# Patient Record
Sex: Female | Born: 1935 | Race: White | Hispanic: No | Smoking: Never smoker
Health system: Southern US, Community
[De-identification: ages and names within clinical notes are randomized; demographics above are authoritative.]

## PROBLEM LIST (undated history)

## (undated) DIAGNOSIS — N179 Acute kidney failure, unspecified: Secondary | ICD-10-CM

## (undated) DIAGNOSIS — R7301 Impaired fasting glucose: Secondary | ICD-10-CM

## (undated) DIAGNOSIS — K219 Gastro-esophageal reflux disease without esophagitis: Secondary | ICD-10-CM

## (undated) DIAGNOSIS — I10 Essential (primary) hypertension: Secondary | ICD-10-CM

## (undated) DIAGNOSIS — S72002A Fracture of unspecified part of neck of left femur, initial encounter for closed fracture: Secondary | ICD-10-CM

## (undated) DIAGNOSIS — I5189 Other ill-defined heart diseases: Secondary | ICD-10-CM

## (undated) DIAGNOSIS — I4819 Other persistent atrial fibrillation: Secondary | ICD-10-CM

## (undated) HISTORY — DX: Other persistent atrial fibrillation: I48.19

## (undated) HISTORY — DX: Impaired fasting glucose: R73.01

## (undated) HISTORY — PX: ABDOMINAL HYSTERECTOMY: SHX81

## (undated) HISTORY — DX: Fracture of unspecified part of neck of left femur, initial encounter for closed fracture: S72.002A

## (undated) HISTORY — DX: Other ill-defined heart diseases: I51.89

## (undated) HISTORY — DX: Acute kidney failure, unspecified: N17.9

## (undated) HISTORY — PX: CHOLECYSTECTOMY: SHX55

---

## 2003-12-02 ENCOUNTER — Ambulatory Visit: Payer: Self-pay | Admitting: Ophthalmology

## 2003-12-02 ENCOUNTER — Other Ambulatory Visit: Payer: Self-pay

## 2003-12-09 ENCOUNTER — Ambulatory Visit: Payer: Self-pay | Admitting: Ophthalmology

## 2004-03-05 ENCOUNTER — Ambulatory Visit: Payer: Self-pay | Admitting: Unknown Physician Specialty

## 2004-07-20 ENCOUNTER — Ambulatory Visit: Payer: Self-pay | Admitting: Unknown Physician Specialty

## 2005-01-06 ENCOUNTER — Emergency Department: Payer: Self-pay | Admitting: Emergency Medicine

## 2005-01-06 ENCOUNTER — Other Ambulatory Visit: Payer: Self-pay

## 2005-01-07 ENCOUNTER — Ambulatory Visit: Payer: Self-pay | Admitting: Internal Medicine

## 2005-02-26 ENCOUNTER — Ambulatory Visit: Payer: Self-pay | Admitting: Unknown Physician Specialty

## 2005-03-10 ENCOUNTER — Ambulatory Visit: Payer: Self-pay | Admitting: Unknown Physician Specialty

## 2005-05-11 ENCOUNTER — Ambulatory Visit: Payer: Self-pay | Admitting: General Surgery

## 2005-06-20 ENCOUNTER — Emergency Department: Payer: Self-pay | Admitting: Emergency Medicine

## 2005-06-22 ENCOUNTER — Ambulatory Visit: Payer: Self-pay | Admitting: Emergency Medicine

## 2005-06-24 ENCOUNTER — Ambulatory Visit: Payer: Self-pay | Admitting: Internal Medicine

## 2005-06-30 ENCOUNTER — Ambulatory Visit: Payer: Self-pay | Admitting: Emergency Medicine

## 2006-03-15 ENCOUNTER — Ambulatory Visit: Payer: Self-pay | Admitting: Unknown Physician Specialty

## 2006-08-04 ENCOUNTER — Ambulatory Visit: Payer: Self-pay | Admitting: Unknown Physician Specialty

## 2006-09-06 ENCOUNTER — Ambulatory Visit: Payer: Self-pay | Admitting: Unknown Physician Specialty

## 2007-06-22 ENCOUNTER — Emergency Department: Payer: Self-pay | Admitting: Emergency Medicine

## 2010-07-19 ENCOUNTER — Emergency Department: Payer: Self-pay | Admitting: Emergency Medicine

## 2015-07-29 ENCOUNTER — Encounter: Payer: Self-pay | Admitting: Emergency Medicine

## 2015-07-29 ENCOUNTER — Emergency Department
Admission: EM | Admit: 2015-07-29 | Discharge: 2015-07-29 | Disposition: A | Discharge status: Home / Self Care | Payer: Medicare Other | Attending: Emergency Medicine | Admitting: Emergency Medicine

## 2015-07-29 ENCOUNTER — Emergency Department: Payer: Medicare Other

## 2015-07-29 DIAGNOSIS — K6289 Other specified diseases of anus and rectum: Secondary | ICD-10-CM

## 2015-07-29 DIAGNOSIS — N200 Calculus of kidney: Secondary | ICD-10-CM

## 2015-07-29 DIAGNOSIS — N390 Urinary tract infection, site not specified: Secondary | ICD-10-CM

## 2015-07-29 DIAGNOSIS — Z90710 Acquired absence of both cervix and uterus: Secondary | ICD-10-CM | POA: Insufficient documentation

## 2015-07-29 DIAGNOSIS — I10 Essential (primary) hypertension: Secondary | ICD-10-CM | POA: Insufficient documentation

## 2015-07-29 DIAGNOSIS — R1084 Generalized abdominal pain: Secondary | ICD-10-CM

## 2015-07-29 HISTORY — DX: Essential (primary) hypertension: I10

## 2015-07-29 LAB — CBC WITH DIFFERENTIAL/PLATELET
BASOS ABS: 0.1 10*3/uL (ref 0–0.1)
Basophils Relative: 1 %
Eosinophils Absolute: 0.2 10*3/uL (ref 0–0.7)
Eosinophils Relative: 3 %
HEMATOCRIT: 42.8 % (ref 35.0–47.0)
Hemoglobin: 14.7 g/dL (ref 12.0–16.0)
LYMPHS ABS: 2.7 10*3/uL (ref 1.0–3.6)
LYMPHS PCT: 27 %
MCH: 33.2 pg (ref 26.0–34.0)
MCHC: 34.3 g/dL (ref 32.0–36.0)
MCV: 96.7 fL (ref 80.0–100.0)
MONO ABS: 0.7 10*3/uL (ref 0.2–0.9)
Monocytes Relative: 7 %
NEUTROS ABS: 6.2 10*3/uL (ref 1.4–6.5)
Neutrophils Relative %: 62 %
Platelets: 210 10*3/uL (ref 150–440)
RBC: 4.42 MIL/uL (ref 3.80–5.20)
RDW: 13.5 % (ref 11.5–14.5)
WBC: 10 10*3/uL (ref 3.6–11.0)

## 2015-07-29 LAB — URINALYSIS COMPLETE WITH MICROSCOPIC (ARMC ONLY)
BILIRUBIN URINE: NEGATIVE
Bacteria, UA: NONE SEEN
GLUCOSE, UA: NEGATIVE mg/dL
Ketones, ur: NEGATIVE mg/dL
Nitrite: NEGATIVE
PH: 7 (ref 5.0–8.0)
Protein, ur: NEGATIVE mg/dL
Specific Gravity, Urine: 1.01 (ref 1.005–1.030)

## 2015-07-29 LAB — COMPREHENSIVE METABOLIC PANEL
ALT: 18 U/L (ref 14–54)
AST: 22 U/L (ref 15–41)
Albumin: 4.4 g/dL (ref 3.5–5.0)
Alkaline Phosphatase: 86 U/L (ref 38–126)
Anion gap: 7 (ref 5–15)
BILIRUBIN TOTAL: 0.9 mg/dL (ref 0.3–1.2)
BUN: 22 mg/dL — AB (ref 6–20)
CALCIUM: 9.4 mg/dL (ref 8.9–10.3)
CO2: 26 mmol/L (ref 22–32)
CREATININE: 0.98 mg/dL (ref 0.44–1.00)
Chloride: 108 mmol/L (ref 101–111)
GFR calc Af Amer: 57 mL/min — ABNORMAL LOW (ref >60)
GFR, EST NON AFRICAN AMERICAN: 49 mL/min — AB (ref >60)
Glucose, Bld: 132 mg/dL — ABNORMAL HIGH (ref 65–99)
Potassium: 3.5 mmol/L (ref 3.5–5.1)
Sodium: 141 mmol/L (ref 135–145)
TOTAL PROTEIN: 6.9 g/dL (ref 6.5–8.1)

## 2015-07-29 LAB — LIPASE, BLOOD: LIPASE: 40 U/L (ref 11–51)

## 2015-07-29 MED ORDER — CEPHALEXIN 500 MG PO CAPS: 500.0000 mg | ORAL_CAPSULE | Freq: Two times a day (BID) | ORAL | 0 refills | Status: DC

## 2015-07-29 MED ORDER — IOPAMIDOL (ISOVUE-300) INJECTION 61%
75.0000 mL | Freq: Once | INTRAVENOUS | Status: AC | PRN
  Administered 2015-07-29: 75 mL via INTRAVENOUS

## 2015-07-29 MED ORDER — SODIUM CHLORIDE 0.9 % IV BOLUS (SEPSIS)
1000.0000 mL | Freq: Once | INTRAVENOUS | Status: AC
  Administered 2015-07-29: 1000 mL via INTRAVENOUS

## 2015-07-29 MED ORDER — DIATRIZOATE MEGLUMINE & SODIUM 66-10 % PO SOLN
15.0000 mL | ORAL | Status: AC
  Administered 2015-07-29: 15 mL via ORAL

## 2015-07-29 NOTE — ED Provider Notes (Signed)
[facility] Emergency Department Provider Note  ____________________________________________  Time seen: Approximately 3:20 AM  I have reviewed the triage vital signs and the nursing notes.   HISTORY  Chief Complaint Abdominal Pain and Rectal Pain    HPI Vanessa Brock is a 82 y.o. female brought to the ED due to rectal pain it's been going on for 2 weeks. She was seen in primary care clinic where they're treating her for anal pruritus presumptively due to hemorrhoids. No vomiting or diarrhea, normal oral intake. Patient denies any abdominal pain. No chest pain shortness breath fever chills or sweats. No dizziness or syncope. No black stool or bloody stool. No dysuria frequency urgency.     Past Medical History:  Diagnosis Date  . Hypertension      There are no active problems to display for this patient.    Past Surgical History:  Procedure Laterality Date  . ABDOMINAL HYSTERECTOMY    Cholecystectomy      Allergies Aspirin and Sulfur   History reviewed. No pertinent family history.  Social History Social History  Substance Use Topics  . Smoking status: Never Smoker  . Smokeless tobacco: Never Used  . Alcohol use No    Review of Systems  Constitutional:   No fever or chills.  ENT:   No sore throat. No rhinorrhea. Cardiovascular:   No chest pain. Respiratory:   No dyspnea or cough. Gastrointestinal:   Negative for abdominal pain, vomiting and diarrhea. Rectal pain Genitourinary:   Negative for dysuria or difficulty urinating. Musculoskeletal:   Negative for focal pain or swelling Neurological:   Negative for headaches 10-point ROS otherwise negative.  ____________________________________________   PHYSICAL EXAM:  VITAL SIGNS: ED Triage Vitals  Enc Vitals Group     BP 07/29/15 0308 (!) 179/80     Pulse Rate 07/29/15 0308 92     Resp 07/29/15 0308 16     Temp 07/29/15 0308 98 F (36.7 C)     Temp Source 07/29/15 0308 Oral      SpO2 07/29/15 0308 95 %     Weight 07/29/15 0308 90 lb (40.8 kg)     Height 07/29/15 0308 5\' 3"  (1.6 m)     Head Circumference --      Peak Flow --      Pain Score 07/29/15 0311 5     Pain Loc --      Pain Edu? --      Excl. in GC? --     Vital signs reviewed, nursing assessments reviewed.   Constitutional:   Alert and oriented. Well appearing and in no distress. Eyes:   No scleral icterus. No conjunctival pallor. PERRL. EOMI.  No nystagmus. ENT   Head:   Normocephalic and atraumatic.   Nose:   No congestion/rhinnorhea. No septal hematoma   Mouth/Throat:   Dry mucous membranes, no pharyngeal erythema. No peritonsillar mass.    Neck:   No stridor. No SubQ emphysema. No meningismus. Hematological/Lymphatic/Immunilogical:   No cervical lymphadenopathy. Cardiovascular:   RRR. Symmetric bilateral radial and DP pulses.  No murmurs.  Respiratory:   Normal respiratory effort without tachypnea nor retractions. Breath sounds are clear and equal bilaterally. No wheezes/rales/rhonchi. Gastrointestinal:   Soft with generalized tenderness. Non distended. There is no CVA tenderness.  No rebound, rigidity, or guarding. Genitourinary:   deferred Musculoskeletal:   Nontender with normal range of motion in all extremities. No joint effusions.  No lower extremity tenderness.  No edema. Neurologic:  Normal speech and language.  CN 2-10 normal. Motor grossly intact. No gross focal neurologic deficits are appreciated.  Skin:    Skin is warm, dry and intact. No rash noted.  No petechiae, purpura, or bullae.  ____________________________________________    LABS (pertinent positives/negatives) (all labs ordered are listed, but only abnormal results are displayed) Labs Reviewed  COMPREHENSIVE METABOLIC PANEL - Abnormal; Notable for the following:       Result Value   Glucose, Bld 132 (*)    BUN 22 (*)    GFR calc non Af Amer 49 (*)    GFR calc Af Amer 57 (*)    All other  components within normal limits  URINALYSIS COMPLETEWITH MICROSCOPIC ([facility] ONLY) - Abnormal; Notable for the following:    Color, Urine YELLOW (*)    APPearance CLEAR (*)    Hgb urine dipstick 1+ (*)    Leukocytes, UA 2+ (*)    Squamous Epithelial / LPF 0-5 (*)    All other components within normal limits  URINE CULTURE  LIPASE, BLOOD  CBC WITH DIFFERENTIAL/PLATELET   ____________________________________________   EKG  Interpreted by me Sinus rhythm rate of 84, normal axis, normal intervals. Normal QRS ST segments and T waves.  ____________________________________________    RADIOLOGY  CT abdomen pelvis reveals diverticulosis without diverticulitis. 7 millimeter nephrolithiasis without obstruction. Largely pain vasculature, no acute findings.  ____________________________________________   PROCEDURES Procedures  ____________________________________________   INITIAL IMPRESSION / ASSESSMENT AND PLAN / ED COURSE  Pertinent labs & imaging results that were available during my care of the patient were reviewed by me and considered in my medical decision making (see chart for details).  Patient well appearing no acute distress. Has abdominal tenderness. No recent abdominal and or imaging. We'll get labs urinalysis and CT scan of the abdomen. IV fluids for now for hydration.    ----------------------------------------- 6:56 AM on 07/29/2015 -----------------------------------------  Urinalysis suggestive of UTI. CT essentially negative. 7 mm nephrolithiasis without obstruction. We'll treat with Keflex, follow-up with primary care. Urine culture sent. Patient is very well-appearing, suitable for outpatient follow-up.   Clinical Course   ____________________________________________   FINAL CLINICAL IMPRESSION(S) / ED DIAGNOSES  Final diagnoses:  Rectal pain  Generalized abdominal pain  Nephrolithiasis  UTI (lower urinary tract infection)       Portions of  this note were generated with dragon dictation software. Dictation errors may occur despite best attempts at proofreading.    Sharman Cheek, MD 07/29/15 [phone]

## 2015-07-29 NOTE — ED Notes (Signed)

## 2015-07-29 NOTE — ED Triage Notes (Signed)
Pt arrived to the ED accompanied by family for complaints of rectal pain/abdominal pain. Pt denies any GI symptoms but admits to vomiting. Pt is AOx4 in moderate pain distress.

## 2015-07-29 NOTE — ED Notes (Signed)
Pt assisted up to commode in room, pt ambulated with steady gait.

## 2015-07-31 LAB — URINE CULTURE

## 2015-08-01 NOTE — Progress Notes (Signed)
MEDICATION RELATED CONSULT NOTE - INITIAL   Pharmacy Consult for ED Culture Results   Allergies  Allergen Reactions  . Aspirin Nausea And Vomiting  . Sulfur Hives   Patient Measurements: Height: 5\' 3"  (160 cm) Weight: 90 lb (40.8 kg) IBW/kg (Calculated) : 52.4  Microbiology: Recent Results (from the past 720 hour(s))  Urine culture     Status: Abnormal   Collection Time: 07/29/15  4:17 AM  Result Value Ref Range Status   Specimen Description URINE, RANDOM  Final   Special Requests NONE  Final   Culture 60,000 COLONIES/mL ENTEROCOCCUS SPECIES (A)  Final   Report Status 07/31/2015 FINAL  Final   Organism ID, Bacteria ENTEROCOCCUS SPECIES (A)  Final      Susceptibility   Enterococcus species - MIC*    AMPICILLIN <=2 SENSITIVE Sensitive     LEVOFLOXACIN 1 SENSITIVE Sensitive     NITROFURANTOIN <=16 SENSITIVE Sensitive     VANCOMYCIN 1 SENSITIVE Sensitive     * 60,000 COLONIES/mL ENTEROCOCCUS SPECIES    Medical History: Past Medical History:  Diagnosis Date  . Hypertension     Assessment: 82 yo female seen in ED for Rectal pain, abdominal pain and nephrolithiasis. Patient's UA questionable for UTI, and patient was discharged on Cephalexin 500mg  BID for 7 days.  Urine cultures showing only 60,000 colonies/mL of ENTEROCOCCUS SPECIES.   Patient had no dysuria, frequency, or urgency.   Plan:  Patient was instructed to F/U with outpatient MD.  Patient has appt scheduled for 8/1 with PCP Maurine Minister, PA at [facility].  Patient's culture results were faxed to office and message left for provider  informing them about patients  ED visit and culture results.   Fax transmission confirmed at 1442 on 7/28.   Cher Nakai, PharmD Clinical Pharmacist 08/01/2015 3:18 PM

## 2016-09-14 ENCOUNTER — Emergency Department
Admission: EM | Admit: 2016-09-14 | Discharge: 2016-09-14 | Disposition: A | Discharge status: Home / Self Care | Payer: Medicare Other | Attending: Emergency Medicine | Admitting: Emergency Medicine

## 2016-09-14 DIAGNOSIS — I10 Essential (primary) hypertension: Secondary | ICD-10-CM | POA: Diagnosis not present

## 2016-09-14 DIAGNOSIS — R102 Pelvic and perineal pain: Secondary | ICD-10-CM | POA: Diagnosis present

## 2016-09-14 DIAGNOSIS — N952 Postmenopausal atrophic vaginitis: Secondary | ICD-10-CM | POA: Diagnosis not present

## 2016-09-14 HISTORY — DX: Gastro-esophageal reflux disease without esophagitis: K21.9

## 2016-09-14 LAB — URINALYSIS, COMPLETE (UACMP) WITH MICROSCOPIC
BACTERIA UA: NONE SEEN
Bilirubin Urine: NEGATIVE
Glucose, UA: NEGATIVE mg/dL
Hgb urine dipstick: NEGATIVE
KETONES UR: NEGATIVE mg/dL
Nitrite: NEGATIVE
PROTEIN: NEGATIVE mg/dL
Specific Gravity, Urine: 1.008 (ref 1.005–1.030)
pH: 7 (ref 5.0–8.0)

## 2016-09-14 MED ORDER — ESTROGENS, CONJUGATED 0.625 MG/GM VA CREA: 0.5000 g | TOPICAL_CREAM | Freq: Every day | VAGINAL | 0 refills | Status: AC

## 2016-09-14 NOTE — ED Notes (Signed)

## 2016-09-14 NOTE — ED Notes (Signed)
Pt assist to toilet in room for urine specimen. No complications

## 2016-09-14 NOTE — Discharge Instructions (Signed)
PLease follow up with your primary care physician °

## 2016-09-14 NOTE — ED Notes (Signed)
ED Provider at bedside. 

## 2016-09-14 NOTE — ED Provider Notes (Signed)
[facility] Emergency Department Provider Note   ____________________________________________   First MD Initiated Contact with Patient 09/14/16 [phone]     (approximate)  I have reviewed the triage vital signs and the nursing notes.   HISTORY  Chief Complaint Cyst    HPI Vanessa Brock is a 83 y.o. female Who comes into the hospital today with some vaginal pain. The patient states that she woke up hurting inside her vagina. She states that she's been having this problem for 2 weeks. She states that she wakes up at night and she has this pain. The patient also reports that she's had some pain with urination. She hasn't seen her doctor for this episode. She's been taking Tylenol to go to sleep but then woke up tonight with the pain. The patient reports that the pain seems to go down into her rectum and down her leg. The patient has had this pain before and her doctor gave her a cream to put on it. They couldn't afford the estrogen cream so she just uses K-Y jelly occasionally. The patient rates her pain a 6 out of 10 in intensity. She said it just aches on the side. She also has a cyst on her inner thigh that she's not sure if that may be causing her symptoms. The patient is here today for evaluation.   Past Medical History:  Diagnosis Date  . Hypertension     There are no active problems to display for this patient.   Past Surgical History:  Procedure Laterality Date  . ABDOMINAL HYSTERECTOMY      Prior to Admission medications   Medication Sig Start Date End Date Taking? Authorizing Provider  cephALEXin (KEFLEX) 500 MG capsule Take 1 capsule (500 mg total) by mouth 2 (two) times daily. 07/29/15   Sharman CheekStafford, Phillip, MD  conjugated estrogens (PREMARIN) vaginal cream Place 0.25 Applicatorfuls vaginally daily. 09/14/16 09/14/17  Rebecka ApleyWebster, Deerica Waszak P, MD    Allergies Aspirin and Sulfur  No family history on file.  Social History Social History  Substance Use  Topics  . Smoking status: Never Smoker  . Smokeless tobacco: Never Used  . Alcohol use No    Review of Systems  Constitutional: No fever/chills Eyes: No visual changes. ENT: No sore throat. Cardiovascular: Denies chest pain. Respiratory: Denies shortness of breath. Gastrointestinal: No abdominal pain.  No nausea, no vomiting.  No diarrhea.  No constipation. Genitourinary: vaginal pain Musculoskeletal: Negative for back pain. Skin: Negative for rash. Neurological: Negative for headaches, focal weakness or numbness.   ____________________________________________   PHYSICAL EXAM:  VITAL SIGNS: ED Triage Vitals  Enc Vitals Group     BP 09/14/16 0317 (!) 179/77     Pulse Rate 09/14/16 0317 85     Resp 09/14/16 0318 17     Temp 09/14/16 0318 97.7 F (36.5 C)     Temp Source 09/14/16 0318 Oral     SpO2 09/14/16 0317 97 %     Weight 09/14/16 0310 90 lb (40.8 kg)     Height 09/14/16 0310 5\' 3"  (1.6 m)     Head Circumference --      Peak Flow --      Pain Score 09/14/16 0309 7     Pain Loc --      Pain Edu? --      Excl. in GC? --     Constitutional: Alert and oriented. Well appearing and in mild distress. Eyes: Conjunctivae are normal. PERRL. EOMI. Head: Atraumatic.  Nose: No congestion/rhinnorhea. Mouth/Throat: Mucous membranes are moist.  Oropharynx non-erythematous. Cardiovascular: Normal rate, regular rhythm. Grossly normal heart sounds.  Good peripheral circulation. Respiratory: Normal respiratory effort.  No retractions. Lungs CTAB. Gastrointestinal: Soft and nontender. No distention.  Genitourinary: normal external genitalia with no discharge and no bleeding. Musculoskeletal: No lower extremity tenderness nor edema.  Neurologic:  Normal speech and language. Skin:  Skin is warm, dry and intact. lesion noted to left medial groin that soft but not fluid filled. no redness or tenderness to palpation Psychiatric: Mood and affect are normal.    ____________________________________________   LABS (all labs ordered are listed, but only abnormal results are displayed)  Labs Reviewed  URINALYSIS, COMPLETE (UACMP) WITH MICROSCOPIC - Abnormal; Notable for the following:       Result Value   Color, Urine YELLOW (*)    APPearance CLEAR (*)    Leukocytes, UA TRACE (*)    Squamous Epithelial / LPF 0-5 (*)    All other components within normal limits   ____________________________________________  EKG  none ____________________________________________  RADIOLOGY  No results found.  ____________________________________________   PROCEDURES  Procedure(s) performed: None  Procedures  Critical Care performed: No  ____________________________________________   INITIAL IMPRESSION / ASSESSMENT AND PLAN / ED COURSE  Pertinent labs & imaging results that were available during my care of the patient were reviewed by me and considered in my medical decision making (see chart for details).  this is a 83 year old female who comes into the hospital today with some vaginal pain. I looked at the patient's vagina and she has no lesions, bleeding or drainage. I also placed a finger into the introitus and the canal and did not feel any lesions. I feel that the patient may have some atrophic vaginitis causing some of her discomfort. The patient's daughter did confirm that she's had this before and was told that it was vaginitis. They report that they could not afford the cream which was prescribed by her doctor. The patient was concerned that the lesion on her leg might be causing the symptoms. It is not fluid filled but soft and nontender. I informed her that I do not feel that that is causing her vaginal pain but if she wants it removed she can all up with surgery. The patient's daughter again states that it's been evaluated by her primary care physician. I will discharge the patient to home to have her follow back up with her primary  care physician. I checked the patient's urine and it does not appear that she has any infection. She will be discharged home.      ____________________________________________   FINAL CLINICAL IMPRESSION(S) / ED DIAGNOSES  Final diagnoses:  Atrophic vaginitis  Vaginal pain      NEW MEDICATIONS STARTED DURING THIS VISIT:  Discharge Medication List as of 09/14/2016  4:56 AM    START taking these medications   Details  conjugated estrogens (PREMARIN) vaginal cream Place 0.25 Applicatorfuls vaginally daily., Starting Tue 09/14/2016, Until Wed 09/14/2017, Print         Note:  This document was prepared using Dragon voice recognition software and may include unintentional dictation errors.    Rebecka Apley, MD 09/14/16 [phone]

## 2018-02-05 ENCOUNTER — Emergency Department: Payer: Medicare Other

## 2018-02-05 ENCOUNTER — Encounter: Payer: Self-pay | Admitting: Emergency Medicine

## 2018-02-05 ENCOUNTER — Other Ambulatory Visit: Payer: Self-pay

## 2018-02-05 ENCOUNTER — Inpatient Hospital Stay
Admission: EM | Admit: 2018-02-05 | Discharge: 2018-02-09 | DRG: 481 | Disposition: A | Discharge status: Skilled Nursing Facility | Payer: Medicare Other | Attending: Internal Medicine | Admitting: Internal Medicine

## 2018-02-05 DIAGNOSIS — N183 Chronic kidney disease, stage 3 (moderate): Secondary | ICD-10-CM | POA: Diagnosis present

## 2018-02-05 DIAGNOSIS — Z66 Do not resuscitate: Secondary | ICD-10-CM | POA: Diagnosis present

## 2018-02-05 DIAGNOSIS — I4891 Unspecified atrial fibrillation: Secondary | ICD-10-CM | POA: Diagnosis not present

## 2018-02-05 DIAGNOSIS — I129 Hypertensive chronic kidney disease with stage 1 through stage 4 chronic kidney disease, or unspecified chronic kidney disease: Secondary | ICD-10-CM | POA: Diagnosis present

## 2018-02-05 DIAGNOSIS — Z882 Allergy status to sulfonamides status: Secondary | ICD-10-CM

## 2018-02-05 DIAGNOSIS — K219 Gastro-esophageal reflux disease without esophagitis: Secondary | ICD-10-CM | POA: Diagnosis present

## 2018-02-05 DIAGNOSIS — S72009A Fracture of unspecified part of neck of unspecified femur, initial encounter for closed fracture: Secondary | ICD-10-CM | POA: Diagnosis present

## 2018-02-05 DIAGNOSIS — N179 Acute kidney failure, unspecified: Secondary | ICD-10-CM | POA: Diagnosis present

## 2018-02-05 DIAGNOSIS — Z886 Allergy status to analgesic agent status: Secondary | ICD-10-CM

## 2018-02-05 DIAGNOSIS — M25552 Pain in left hip: Secondary | ICD-10-CM | POA: Diagnosis present

## 2018-02-05 DIAGNOSIS — S72142A Displaced intertrochanteric fracture of left femur, initial encounter for closed fracture: Secondary | ICD-10-CM | POA: Diagnosis present

## 2018-02-05 DIAGNOSIS — S72002A Fracture of unspecified part of neck of left femur, initial encounter for closed fracture: Secondary | ICD-10-CM

## 2018-02-05 DIAGNOSIS — Z79899 Other long term (current) drug therapy: Secondary | ICD-10-CM | POA: Diagnosis not present

## 2018-02-05 DIAGNOSIS — Z419 Encounter for procedure for purposes other than remedying health state, unspecified: Secondary | ICD-10-CM

## 2018-02-05 DIAGNOSIS — D72829 Elevated white blood cell count, unspecified: Secondary | ICD-10-CM | POA: Diagnosis present

## 2018-02-05 DIAGNOSIS — R7301 Impaired fasting glucose: Secondary | ICD-10-CM | POA: Diagnosis present

## 2018-02-05 DIAGNOSIS — Z90710 Acquired absence of both cervix and uterus: Secondary | ICD-10-CM | POA: Diagnosis not present

## 2018-02-05 DIAGNOSIS — W1830XA Fall on same level, unspecified, initial encounter: Secondary | ICD-10-CM | POA: Diagnosis present

## 2018-02-05 DIAGNOSIS — E860 Dehydration: Secondary | ICD-10-CM | POA: Diagnosis present

## 2018-02-05 LAB — CBC WITH DIFFERENTIAL/PLATELET
ABS IMMATURE GRANULOCYTES: 0.07 10*3/uL (ref 0.00–0.07)
BASOS ABS: 0.1 10*3/uL (ref 0.0–0.1)
BASOS PCT: 1 %
Eosinophils Absolute: 0 10*3/uL (ref 0.0–0.5)
Eosinophils Relative: 0 %
HCT: 36.5 % (ref 36.0–46.0)
HEMOGLOBIN: 12.2 g/dL (ref 12.0–15.0)
Immature Granulocytes: 0 %
LYMPHS PCT: 6 %
Lymphs Abs: 1.1 10*3/uL (ref 0.7–4.0)
MCH: 32.6 pg (ref 26.0–34.0)
MCHC: 33.4 g/dL (ref 30.0–36.0)
MCV: 97.6 fL (ref 80.0–100.0)
Monocytes Absolute: 1.1 10*3/uL — ABNORMAL HIGH (ref 0.1–1.0)
Monocytes Relative: 6 %
NEUTROS ABS: 15.3 10*3/uL — AB (ref 1.7–7.7)
NRBC: 0 % (ref 0.0–0.2)
Neutrophils Relative %: 87 %
Platelets: 276 10*3/uL (ref 150–400)
RBC: 3.74 MIL/uL — AB (ref 3.87–5.11)
RDW: 13 % (ref 11.5–15.5)
WBC: 17.7 10*3/uL — AB (ref 4.0–10.5)

## 2018-02-05 LAB — PROTIME-INR
INR: 1.05
PROTHROMBIN TIME: 13.6 s (ref 11.4–15.2)

## 2018-02-05 LAB — BASIC METABOLIC PANEL
ANION GAP: 6 (ref 5–15)
BUN: 27 mg/dL — ABNORMAL HIGH (ref 8–23)
CHLORIDE: 108 mmol/L (ref 98–111)
CO2: 24 mmol/L (ref 22–32)
Calcium: 7.9 mg/dL — ABNORMAL LOW (ref 8.9–10.3)
Creatinine, Ser: 1.37 mg/dL — ABNORMAL HIGH (ref 0.44–1.00)
GFR calc Af Amer: 38 mL/min — ABNORMAL LOW (ref >60)
GFR calc non Af Amer: 33 mL/min — ABNORMAL LOW (ref >60)
Glucose, Bld: 226 mg/dL — ABNORMAL HIGH (ref 70–99)
POTASSIUM: 3.6 mmol/L (ref 3.5–5.1)
SODIUM: 138 mmol/L (ref 135–145)

## 2018-02-05 LAB — SURGICAL PCR SCREEN
MRSA, PCR: NEGATIVE
Staphylococcus aureus: NEGATIVE

## 2018-02-05 MED ORDER — SODIUM CHLORIDE 0.9 % IV SOLN
INTRAVENOUS | Status: DC
  Administered 2018-02-05: via INTRAVENOUS

## 2018-02-05 MED ORDER — LOSARTAN POTASSIUM 50 MG PO TABS
50.0000 mg | ORAL_TABLET | Freq: Every day | ORAL | Status: DC
  Administered 2018-02-06 – 2018-02-09 (×4): 50 mg via ORAL
  Filled 2018-02-05 (×4): qty 1

## 2018-02-05 MED ORDER — DILTIAZEM HCL ER BEADS 240 MG PO CP24
360.0000 mg | ORAL_CAPSULE | Freq: Every day | ORAL | Status: DC
  Filled 2018-02-05: qty 1

## 2018-02-05 MED ORDER — ACETAMINOPHEN 325 MG PO TABS: 650.0000 mg | ORAL_TABLET | Freq: Four times a day (QID) | ORAL | Status: DC | PRN

## 2018-02-05 MED ORDER — ONDANSETRON HCL 4 MG/2ML IJ SOLN: 4.0000 mg | Freq: Four times a day (QID) | INTRAMUSCULAR | Status: DC | PRN

## 2018-02-05 MED ORDER — MORPHINE SULFATE (PF) 2 MG/ML IV SOLN
2.0000 mg | INTRAVENOUS | Status: DC | PRN
  Administered 2018-02-05 – 2018-02-06 (×2): 2 mg via INTRAVENOUS
  Filled 2018-02-05: qty 1

## 2018-02-05 MED ORDER — MORPHINE SULFATE (PF) 2 MG/ML IV SOLN
INTRAVENOUS | Status: AC
  Administered 2018-02-05: 2 mg via INTRAVENOUS
  Filled 2018-02-05: qty 1

## 2018-02-05 MED ORDER — BISACODYL 5 MG PO TBEC: 5.0000 mg | DELAYED_RELEASE_TABLET | Freq: Every day | ORAL | Status: DC | PRN

## 2018-02-05 MED ORDER — MORPHINE SULFATE (PF) 4 MG/ML IV SOLN
4.0000 mg | Freq: Once | INTRAVENOUS | Status: AC
  Administered 2018-02-05: 4 mg via INTRAVENOUS
  Filled 2018-02-05: qty 1

## 2018-02-05 MED ORDER — CEFAZOLIN SODIUM-DEXTROSE 1-4 GM/50ML-% IV SOLN
1.0000 g | Freq: Once | INTRAVENOUS | Status: DC
  Filled 2018-02-05: qty 50

## 2018-02-05 MED ORDER — ONDANSETRON HCL 4 MG PO TABS: 4.0000 mg | ORAL_TABLET | Freq: Four times a day (QID) | ORAL | Status: DC | PRN

## 2018-02-05 MED ORDER — POLYETHYLENE GLYCOL 3350 17 G PO PACK: 17.0000 g | PACK | Freq: Every day | ORAL | Status: DC | PRN

## 2018-02-05 MED ORDER — ACETAMINOPHEN 650 MG RE SUPP: 650.0000 mg | Freq: Four times a day (QID) | RECTAL | Status: DC | PRN

## 2018-02-05 MED ORDER — HYDROCODONE-ACETAMINOPHEN 5-325 MG PO TABS
1.0000 | ORAL_TABLET | ORAL | Status: DC | PRN
  Administered 2018-02-05 – 2018-02-06 (×2): 1 via ORAL
  Filled 2018-02-05 (×2): qty 1

## 2018-02-05 MED ORDER — FLEET ENEMA 7-19 GM/118ML RE ENEM: 1.0000 | ENEMA | Freq: Once | RECTAL | Status: DC | PRN

## 2018-02-05 MED ORDER — ADULT MULTIVITAMIN W/MINERALS CH
1.0000 | ORAL_TABLET | Freq: Every day | ORAL | Status: DC
  Administered 2018-02-07 – 2018-02-09 (×3): 1 via ORAL
  Filled 2018-02-05 (×3): qty 1

## 2018-02-05 MED ORDER — ONDANSETRON HCL 4 MG/2ML IJ SOLN
4.0000 mg | Freq: Once | INTRAMUSCULAR | Status: AC
  Administered 2018-02-05: 4 mg via INTRAVENOUS
  Filled 2018-02-05: qty 2

## 2018-02-05 MED ORDER — PANTOPRAZOLE SODIUM 40 MG PO TBEC: 40.0000 mg | DELAYED_RELEASE_TABLET | Freq: Every day | ORAL | Status: DC

## 2018-02-05 MED ORDER — CALCIUM CARBONATE-VITAMIN D 500-200 MG-UNIT PO TABS
1.0000 | ORAL_TABLET | Freq: Two times a day (BID) | ORAL | Status: DC
  Administered 2018-02-06 – 2018-02-09 (×6): 1 via ORAL
  Filled 2018-02-05 (×8): qty 1

## 2018-02-05 NOTE — Progress Notes (Signed)
Family Meeting Note  Advance Directive:yes  Today a meeting took place with the Patient.family The following clinical team members were present during this meeting:MD  The following were discussed:Patient's diagnosis:left hip fracture , Patient's progosis: Unable to determine and Goals for treatment: DNR  Additional follow-up to be provided: No change advanced directives patient is DNR Outpatient palliative care services would be of benefit at discharge  Time spent during discussion: 16 minutes  Vanessa Moro, MD

## 2018-02-05 NOTE — ED Triage Notes (Signed)
Patient presents to the ED via EMS from home post mechanical fall over a threshold.  Patient denies hitting her head and does not take blood thinners.  Patient is complaining of left hip pain that is severe when she moves.  Per EMS patient was unable to straighten her left leg.

## 2018-02-05 NOTE — H&P (Signed)
[facility] [place] [facility]   PATIENT NAME: Vanessa Brock    MR#:  [id number]  DATE OF BIRTH:  Aug 05, 1922  DATE OF ADMISSION:  02/05/2018  PRIMARY CARE PHYSICIAN: Vanessa ParadiseMcLaughlin, Miriam K, MD   REQUESTING/REFERRING PHYSICIAN: dr Marisa Brock  CHIEF COMPLAINT:   Hip pain HISTORY OF PRESENT ILLNESS:  Vanessa Brock  is a 85 y.o. female with a known history of hypertension who presents after mechanical fall. Patient arrived to the ER via EMS after she fell from a threshold.  She denies hitting her head.  She was complaining of left hip pain.  She is found to have a left hip fracture.  ER physician has spoken with the orthopedic surgeon who plans to take patient to the OR tomorrow. PAST MEDICAL HISTORY:   Past Medical History:  Diagnosis Date  . GERD (gastroesophageal reflux disease)   . Hypertension     PAST SURGICAL HISTORY:   Past Surgical History:  Procedure Laterality Date  . ABDOMINAL HYSTERECTOMY    . CHOLECYSTECTOMY      SOCIAL HISTORY:   Social History   Tobacco Use  . Smoking status: Never Smoker  . Smokeless tobacco: Never Used  Substance Use Topics  . Alcohol use: No    FAMILY HISTORY:  No family history on file.  DRUG ALLERGIES:   Allergies  Allergen Reactions  . Aspirin Nausea And Vomiting  . Sulfur Hives    REVIEW OF SYSTEMS:   Review of Systems  Constitutional: Negative.  Negative for chills, fever and malaise/fatigue.  HENT: Negative.  Negative for ear discharge, ear pain, hearing loss, nosebleeds and sore throat.   Eyes: Negative.  Negative for blurred vision and pain.  Respiratory: Negative.  Negative for cough, hemoptysis, shortness of breath and wheezing.   Cardiovascular: Negative.  Negative for chest pain, palpitations and leg swelling.  Gastrointestinal: Negative.  Negative for abdominal pain, blood in stool, diarrhea, nausea and vomiting.  Genitourinary: Negative.  Negative for dysuria.  Musculoskeletal: Positive for falls and  joint pain. Negative for back pain.  Skin: Negative.   Neurological: Negative for dizziness, tremors, speech change, focal weakness, seizures and headaches.  Endo/Heme/Allergies: Negative.  Does not bruise/bleed easily.  Psychiatric/Behavioral: Negative.  Negative for depression, hallucinations and suicidal ideas.    MEDICATIONS AT HOME:   Prior to Admission medications   Medication Sig Start Date End Date Taking? Authorizing Provider  Calcium Carb-Cholecalciferol (CALCIUM-VITAMIN D) 500-200 MG-UNIT tablet Take 1 tablet by mouth 2 (two) times daily.   Yes [provider]  diltiazem (TIAZAC) 360 MG 24 hr capsule Take 360 mg by mouth daily. 01/15/18  Yes [provider]  losartan (COZAAR) 25 MG tablet Take 50 mg by mouth daily.  02/02/18  Yes [provider]  Multiple Vitamin (MULTIVITAMIN) capsule Take 1 capsule by mouth daily.   Yes [provider]  omeprazole (PRILOSEC) 20 MG capsule Take 20 mg by mouth daily. 01/15/18  Yes [provider]  cephALEXin (KEFLEX) 500 MG capsule Take 1 capsule (500 mg total) by mouth 2 (two) times daily. 07/29/15   Sharman CheekStafford, Phillip, MD      VITAL SIGNS:  Blood pressure 136/60, pulse 82, resp. rate 12, SpO2 95 %.  PHYSICAL EXAMINATION:   Physical Exam Constitutional:      General: She is not in acute distress. HENT:     Head: Normocephalic.  Eyes:     General: No scleral icterus. Neck:     Musculoskeletal: Normal range of motion and  neck supple.     Vascular: No JVD.     Trachea: No tracheal deviation.  Cardiovascular:     Rate and Rhythm: Normal rate and regular rhythm.     Heart sounds: Normal heart sounds. No murmur. No friction rub. No gallop.   Pulmonary:     Effort: Pulmonary effort is normal. No respiratory distress.     Breath sounds: Normal breath sounds. No wheezing or rales.  Chest:     Chest wall: No tenderness.  Abdominal:     General: Bowel sounds are normal. There is no distension.      Palpations: Abdomen is soft. There is no mass.     Tenderness: There is no abdominal tenderness. There is no guarding or rebound.  Musculoskeletal:     Comments: Left leg is shorter  Skin:    General: Skin is warm.     Findings: No erythema or rash.  Neurological:     Mental Status: She is alert and oriented to person, place, and time.  Psychiatric:        Judgment: Judgment normal.       LABORATORY PANEL:   CBC Recent Labs  Lab 02/05/18 1619  WBC 17.7*  HGB 12.2  HCT 36.5  PLT 276   ------------------------------------------------------------------------------------------------------------------  Chemistries  Recent Labs  Lab 02/05/18 1619  NA 138  Brock 3.6  CL 108  CO2 24  GLUCOSE 226*  BUN 27*  CREATININE 1.37*  CALCIUM 7.9*   ------------------------------------------------------------------------------------------------------------------  Cardiac Enzymes No results for input(s): TROPONINI in the last 168 hours. ------------------------------------------------------------------------------------------------------------------  RADIOLOGY:  Dg Hip Unilat With Pelvis 2-3 Views Left  Result Date: 02/05/2018 CLINICAL DATA:  Fall. EXAM: DG HIP (WITH OR WITHOUT PELVIS) 2-3V LEFT COMPARISON:  None. FINDINGS: Frontal pelvis shows diffuse bony demineralization. SI joints and symphysis pubis unremarkable. No evidence for pubic ramus fracture. AP and cross-table lateral views of the left hip show an intertrochanteric left femoral neck fracture with varus angulation. IMPRESSION: Intertrochanteric left femoral neck fracture with varus angulation. Electronically Signed   By: Kennith Center M.D.   On: 02/05/2018 15:52    EKG:  Normal sinus rhythm PVCs no ST elevation or depression  IMPRESSION AND PLAN:   85 year old female with history of hypertension who lives at home by herself presents to the ER via EMS after mechanical fall and has suffered left hip fracture.  1.   Intertrochanteric left femoral neck fracture with valgus angulation: Patient is at moderate risk for moderate risk procedure may proceed to surgery without further cardiac work-up. DVT prophylaxis per orthopedic surgery. Continue pain control Continue calcium supplement 2.  Essential hypertension: Continue Cozaar and diltiazem  3.  GERD: Continue PPI  4.  Elevated white blood cell count in the setting of acute fracture without signs of any acute infection  5.  Acute kidney injury in the setting of mild dehydration: IV fluids and repeat BMP in a.m.  All the records are reviewed and case discussed with ED provider. Management plans discussed with the patient and family and they are in agreement  CODE STATUS: DNR  TOTAL TIME TAKING CARE OF THIS PATIENT: 44 minutes.    Kaylyn Garrow M.D on 02/05/2018 at 6:10 PM  Between 7am to 6pm - Pager - [phone]  After 6pm go to [email or web address] - Social research officer, government  [facility]  Office  [phone]  CC: Primary care physician; Vanessa Paradise, MD

## 2018-02-05 NOTE — Consult Note (Signed)
ORTHOPAEDIC CONSULTATION  REQUESTING PHYSICIAN: Adrian Saran, MD  Chief Complaint:   L hip pain  History of Present Illness: Vanessa Brock is a 85 y.o. female who had a fall earlier today.  The patient noted immediate hip pain and inability to ambulate.  The patient ambulates unassisted at baseline and lives by herself. Given her age, she is relatively active and gardens/does yard work daily.  Pain is described as sharp at its worst and a dull ache at its best.  Pain is rated a 10 out of 10 in severity.  Pain is improved with rest and immobilization.  Pain is worse with any sort of movement.  X-rays in the emergency department show a left intertrochanteric hip fracture.  She last ate between 1-2pm today.   Past Medical History:  Diagnosis Date  . GERD (gastroesophageal reflux disease)   . Hypertension    Past Surgical History:  Procedure Laterality Date  . ABDOMINAL HYSTERECTOMY    . CHOLECYSTECTOMY     Social History   Socioeconomic History  . Marital status: Divorced    Spouse name: Not on file  . Number of children: Not on file  . Years of education: Not on file  . Highest education level: Not on file  Occupational History  . Not on file  Social Needs  . Financial resource strain: Not on file  . Food insecurity:    Worry: Not on file    Inability: Not on file  . Transportation needs:    Medical: Not on file    Non-medical: Not on file  Tobacco Use  . Smoking status: Never Smoker  . Smokeless tobacco: Never Used  Substance and Sexual Activity  . Alcohol use: No  . Drug use: No  . Sexual activity: Never  Lifestyle  . Physical activity:    Days per week: Not on file    Minutes per session: Not on file  . Stress: Not on file  Relationships  . Social connections:    Talks on phone: Not on file    Gets together: Not on file    Attends religious service: Not on file    Active member of club or  organization: Not on file    Attends meetings of clubs or organizations: Not on file    Relationship status: Not on file  Other Topics Concern  . Not on file  Social History Narrative  . Not on file   No family history on file. Allergies  Allergen Reactions  . Aspirin Nausea And Vomiting  . Sulfur Hives   Prior to Admission medications   Medication Sig Start Date End Date Taking? Authorizing Provider  Calcium Carb-Cholecalciferol (CALCIUM-VITAMIN D) 500-200 MG-UNIT tablet Take 1 tablet by mouth 2 (two) times daily.   Yes [provider]  diltiazem (TIAZAC) 360 MG 24 hr capsule Take 360 mg by mouth daily. 01/15/18  Yes [provider]  losartan (COZAAR) 25 MG tablet Take 50 mg by mouth daily.  02/02/18  Yes [provider]  Multiple Vitamin (MULTIVITAMIN) capsule Take 1 capsule by mouth daily.   Yes [provider]  omeprazole (PRILOSEC) 20 MG capsule Take 20 mg by mouth daily. 01/15/18  Yes [provider]  cephALEXin (KEFLEX) 500 MG capsule Take 1 capsule (500 mg total) by mouth 2 (two) times daily. 07/29/15   Sharman Cheek, MD   Recent Labs    02/05/18 1619  WBC 17.7*  HGB 12.2  HCT 36.5  PLT 276  K 3.6  CL 108  CO2 24  BUN 27*  CREATININE 1.37*  GLUCOSE 226*  CALCIUM 7.9*  INR 1.05   Dg Hip Unilat With Pelvis 2-3 Views Left  Result Date: 02/05/2018 CLINICAL DATA:  Fall. EXAM: DG HIP (WITH OR WITHOUT PELVIS) 2-3V LEFT COMPARISON:  None. FINDINGS: Frontal pelvis shows diffuse bony demineralization. SI joints and symphysis pubis unremarkable. No evidence for pubic ramus fracture. AP and cross-table lateral views of the left hip show an intertrochanteric left femoral neck fracture with varus angulation. IMPRESSION: Intertrochanteric left femoral neck fracture with varus angulation. Electronically Signed   By: Kennith CenterEric  Mansell M.D.   On: 02/05/2018 15:52     Positive ROS: All other systems have been reviewed and were otherwise  negative with the exception of those mentioned in the HPI and as above.  Physical Exam: BP (!) 145/64   Pulse 84   Resp (!) 22   SpO2 95%  General:  Alert, no acute distress Psychiatric:  Patient is competent for consent with normal mood and affect   Cardiovascular:  No pedal edema, regular rate and rhythm Respiratory:  No wheezing, non-labored breathing GI:  Abdomen is soft and non-tender Skin:  No lesions in the area of chief complaint, no erythema Neurologic:  Sensation intact distally, CN grossly intact Lymphatic:  No axillary or cervical lymphadenopathy  Orthopedic Exam:  LLE: + DF/PF/EHL SILT grossly over foot Foot wwp +Log roll/axial load   X-rays:  As above: L intertrochanteric hip fracture  Assessment/Plan: Vanessa Brock is a 85 y.o. female with a L intertrochanteric hip fracture    1. I discussed the various treatment options including both surgical and non-surgical management of her fracture with the patient and family. We discussed the high risk of perioperative complications due to patient's age. After discussion of risks, benefits, and alternatives to surgery, the family and patient were in agreement to proceed with surgery. The goals of surgery would be to provide adequate pain relief and allow for early mobilization. Plan for surgery is L hip cephalomedullary nailing tomorrow, 02/06/18 with Dr. Joice LoftsPoggi. 2. NPO after midnight 3. Hold anticoagulation in advance of OR 4. Admit to Hospitalist service    Signa KellSunny Desmin Daleo   02/05/2018 7:41 PM

## 2018-02-05 NOTE — ED Notes (Signed)
ED TO INPATIENT HANDOFF REPORT  Name/Age/Gender Vanessa Brock 85 y.o. female  Code Status   Home/SNF/Other unknown  Chief Complaint fall  Level of Care/Admitting Diagnosis ED Disposition    ED Disposition Condition Comment   Admit  Hospital Area: [facility] [[id number]]  Level of Care: Med-Surg [16]  Diagnosis: Hip fracture [facility]) [[id number]]  Admitting Physician: Adrian Saran [[id number]]  Attending Physician: MODY, Patricia Pesa [[id number]]  Estimated length of stay: past midnight tomorrow  Certification:: I certify this patient will need inpatient services for at least 2 midnights  PT Class (Do Not Modify): Inpatient [101]  PT Acc Code (Do Not Modify): Private [1]       Medical History Past Medical History:  Diagnosis Date  . GERD (gastroesophageal reflux disease)   . Hypertension     Allergies Allergies  Allergen Reactions  . Aspirin Nausea And Vomiting  . Sulfur Hives    IV Location/Drains/Wounds Patient Lines/Drains/Airways Status   Active Line/Drains/Airways    Name:   Placement date:   Placement time:   Site:   Days:   Peripheral IV 02/05/18 Left Forearm   02/05/18    -    Forearm   less than 1          Labs/Imaging Results for orders placed or performed during the hospital encounter of 02/05/18 (from the past 48 hour(s))  Basic metabolic panel     Status: Abnormal   Collection Time: 02/05/18  4:19 PM  Result Value Ref Range   Sodium 138 135 - 145 mmol/L   Potassium 3.6 3.5 - 5.1 mmol/L   Chloride 108 98 - 111 mmol/L   CO2 24 22 - 32 mmol/L   Glucose, Bld 226 (H) 70 - 99 mg/dL   BUN 27 (H) 8 - 23 mg/dL   Creatinine, Ser 2.44 (H) 0.44 - 1.00 mg/dL   Calcium 7.9 (L) 8.9 - 10.3 mg/dL   GFR calc non Af Amer 33 (L) >60 mL/min   GFR calc Af Amer 38 (L) >60 mL/min   Anion gap 6 5 - 15    Comment: Performed at [facility], [address] [place], [place] [address]  CBC with Differential     Status: Abnormal   Collection Time: 02/05/18   4:19 PM  Result Value Ref Range   WBC 17.7 (H) 4.0 - 10.5 K/uL   RBC 3.74 (L) 3.87 - 5.11 MIL/uL   Hemoglobin 12.2 12.0 - 15.0 g/dL   HCT 25.3 66.4 - 40.3 %   MCV 97.6 80.0 - 100.0 fL   MCH 32.6 26.0 - 34.0 pg   MCHC 33.4 30.0 - 36.0 g/dL   RDW 47.4 25.9 - 56.3 %   Platelets 276 150 - 400 K/uL   nRBC 0.0 0.0 - 0.2 %   Neutrophils Relative % 87 %   Neutro Abs 15.3 (H) 1.7 - 7.7 K/uL   Lymphocytes Relative 6 %   Lymphs Abs 1.1 0.7 - 4.0 K/uL   Monocytes Relative 6 %   Monocytes Absolute 1.1 (H) 0.1 - 1.0 K/uL   Eosinophils Relative 0 %   Eosinophils Absolute 0.0 0.0 - 0.5 K/uL   Basophils Relative 1 %   Basophils Absolute 0.1 0.0 - 0.1 K/uL   Immature Granulocytes 0 %   Abs Immature Granulocytes 0.07 0.00 - 0.07 K/uL    Comment: Performed at [facility], [address] [place], [place] [address]  Protime-INR     Status: None  Collection Time: 02/05/18  4:19 PM  Result Value Ref Range   Prothrombin Time 13.6 11.4 - 15.2 seconds   INR 1.05     Comment: Performed at [facility], [address] [place], [place] [address]   Dg Hip Unilat With Pelvis 2-3 Views Left  Result Date: 02/05/2018 CLINICAL DATA:  Fall. EXAM: DG HIP (WITH OR WITHOUT PELVIS) 2-3V LEFT COMPARISON:  None. FINDINGS: Frontal pelvis shows diffuse bony demineralization. SI joints and symphysis pubis unremarkable. No evidence for pubic ramus fracture. AP and cross-table lateral views of the left hip show an intertrochanteric left femoral neck fracture with varus angulation. IMPRESSION: Intertrochanteric left femoral neck fracture with varus angulation. Electronically Signed   By: Kennith Center M.D.   On: 02/05/2018 15:52    Pending Labs [employer] (From admission, onward)    Start     Ordered   Signed and Armed forces training and education officer morning,   R     Signed and Held   Signed and Held  CBC  Tomorrow morning,   R     Signed and Held          Vitals/Pain Today's Vitals    02/05/18 1800 02/05/18 1830 02/05/18 1900 02/05/18 1929  BP: (!) 144/57 (!) 134/56 (!) 139/58   Pulse: 84 82 82   Resp: 15 20 16    SpO2: 96% 95% 95%   PainSc:    10-Worst pain ever    Isolation Precautions No active isolations  Medications Medications  morphine 2 MG/ML injection 2 mg (2 mg Intravenous Given 02/05/18 1928)  morphine 4 MG/ML injection 4 mg (4 mg Intravenous Given 02/05/18 1557)  ondansetron (ZOFRAN) injection 4 mg (4 mg Intravenous Given 02/05/18 1556)    Mobility Bedrest until surgery

## 2018-02-05 NOTE — ED Provider Notes (Signed)
[facility] Emergency Department Provider Note ____________________________________________   None    (approximate)  I have reviewed the triage vital signs and the nursing notes.   HISTORY  Chief Complaint Fall    HPI JULIEANNE KASEY is a 85 y.o. female with PMH as noted below who presents with left hip pain after a mechanical fall from standing height, associated with inability to walk or bear weight.  She states the pain is worse when she tries to move her hip.  She denies any head injury or other injuries.  Past Medical History:  Diagnosis Date  . GERD (gastroesophageal reflux disease)   . Hypertension     There are no active problems to display for this patient.   Past Surgical History:  Procedure Laterality Date  . ABDOMINAL HYSTERECTOMY    . CHOLECYSTECTOMY      Prior to Admission medications   Medication Sig Start Date End Date Taking? Authorizing Provider  Calcium Carb-Cholecalciferol (CALCIUM-VITAMIN D) 500-200 MG-UNIT tablet Take 1 tablet by mouth 2 (two) times daily.   Yes [provider]  diltiazem (TIAZAC) 360 MG 24 hr capsule Take 360 mg by mouth daily. 01/15/18  Yes [provider]  losartan (COZAAR) 25 MG tablet Take 50 mg by mouth daily.  02/02/18  Yes [provider]  Multiple Vitamin (MULTIVITAMIN) capsule Take 1 capsule by mouth daily.   Yes [provider]  omeprazole (PRILOSEC) 20 MG capsule Take 20 mg by mouth daily. 01/15/18  Yes [provider]  cephALEXin (KEFLEX) 500 MG capsule Take 1 capsule (500 mg total) by mouth 2 (two) times daily. 07/29/15   Sharman Cheek, MD    Allergies Aspirin and Sulfur  No family history on file.  Social History Social History   Tobacco Use  . Smoking status: Never Smoker  . Smokeless tobacco: Never Used  Substance Use Topics  . Alcohol use: No  . Drug use: No    Review of Systems  Constitutional: No fever. Eyes: No redness. ENT: No  neck pain. Cardiovascular: Denies chest pain. Respiratory: Denies shortness of breath. Gastrointestinal: No abdominal pain.  Genitourinary: Negative for flank pain.  Musculoskeletal: Negative for back pain.  Positive for left hip pain. Skin: Negative for rash. Neurological: Negative for headache.   ____________________________________________   PHYSICAL EXAM:  VITAL SIGNS: ED Triage Vitals  Enc Vitals Group     BP --      Pulse --      Resp 02/05/18 1525 18     Temp --      Temp src --      SpO2 02/05/18 1525 98 %     Weight --      Height --      Head Circumference --      Peak Flow --      Pain Score 02/05/18 1527 10     Pain Loc --      Pain Edu? --      Excl. in GC? --     Constitutional: Alert and oriented. Well appearing and in no acute distress. Eyes: Conjunctivae are normal.  Head: Atraumatic. Nose: No congestion/rhinnorhea. Mouth/Throat: Mucous membranes are moist.   Neck: Normal range of motion.  Cardiovascular: Good peripheral circulation. Respiratory: Normal respiratory effort.   Gastrointestinal: No distention.  Musculoskeletal: No lower extremity edema.  Extremities warm and well perfused.  Left hip tenderness and pain on range of motion.  No significant deformity. Neurologic: Motor intact in all  extremities.   Skin:  Skin is warm and dry. No rash noted. Psychiatric: Mood and affect are normal. Speech and behavior are normal.  ____________________________________________   LABS (all labs ordered are listed, but only abnormal results are displayed)  Labs Reviewed  BASIC METABOLIC PANEL - Abnormal; Notable for the following components:      Result Value   Glucose, Bld 226 (*)    BUN 27 (*)    Creatinine, Ser 1.37 (*)    Calcium 7.9 (*)    GFR calc non Af Amer 33 (*)    GFR calc Af Amer 38 (*)    All other components within normal limits  CBC WITH DIFFERENTIAL/PLATELET - Abnormal; Notable for the following components:   WBC 17.7 (*)    RBC  3.74 (*)    Neutro Abs 15.3 (*)    Monocytes Absolute 1.1 (*)    All other components within normal limits  PROTIME-INR   ____________________________________________  EKG  ED ECG REPORT I, Dionne Bucy, the attending physician, personally viewed and interpreted this ECG.  Date: 02/05/2018 EKG Time: 1613 Rate: 84 Rhythm: normal sinus rhythm with PVCs QRS Axis: normal Intervals: RBBB, LPFB ST/T Wave abnormalities: Nonspecific ST abnormalities when compared to EKG from 2017 Narrative Interpretation: Nonspecific ST abnormalities with no evidence of acute ischemia    ____________________________________________  RADIOLOGY  XR L hip: Intertrochanteric left femoral neck fracture  ____________________________________________   PROCEDURES  Procedure(s) performed: No  Procedures  Critical Care performed: No ____________________________________________   INITIAL IMPRESSION / ASSESSMENT AND PLAN / ED COURSE  Pertinent labs & imaging results that were available during my care of the patient were reviewed by me and considered in my medical decision making (see chart for details).  85 year old female with hypertension and GERD presents with left hip pain after a mechanical fall from standing height.  She has no other injuries.  She had no dizziness or prodrome.  X-ray shows left femoral neck fracture.  The patient will require orthopedic consult and admission.  I ordered labs for medical clearance.  ----------------------------------------- 5:56 PM on 02/05/2018 -----------------------------------------  I consulted Dr. Allena Katz from orthopedics.  I admitted the patient to the hospitalist Dr. Juliene Pina at approximately 5:55 PM.   ____________________________________________   FINAL CLINICAL IMPRESSION(S) / ED DIAGNOSES  Final diagnoses:  Closed fracture of neck of left femur, initial encounter (HCC)      NEW MEDICATIONS STARTED DURING THIS VISIT:  New  Prescriptions   No medications on file     Note:  This document was prepared using Dragon voice recognition software and may include unintentional dictation errors.    Dionne Bucy, MD 02/05/18 [phone]

## 2018-02-06 ENCOUNTER — Encounter
Admission: EM | Disposition: A | Discharge status: Skilled Nursing Facility | Payer: Self-pay | Source: Home / Self Care | Attending: Internal Medicine

## 2018-02-06 ENCOUNTER — Inpatient Hospital Stay: Payer: Medicare Other

## 2018-02-06 ENCOUNTER — Inpatient Hospital Stay: Payer: Medicare Other | Admitting: Anesthesiology

## 2018-02-06 HISTORY — PX: HIP SURGERY: SHX245

## 2018-02-06 HISTORY — PX: INTRAMEDULLARY (IM) NAIL INTERTROCHANTERIC: SHX5875

## 2018-02-06 LAB — BASIC METABOLIC PANEL
Anion gap: 4 — ABNORMAL LOW (ref 5–15)
BUN: 25 mg/dL — ABNORMAL HIGH (ref 8–23)
CALCIUM: 9 mg/dL (ref 8.9–10.3)
CO2: 29 mmol/L (ref 22–32)
Chloride: 104 mmol/L (ref 98–111)
Creatinine, Ser: 1.12 mg/dL — ABNORMAL HIGH (ref 0.44–1.00)
GFR calc Af Amer: 49 mL/min — ABNORMAL LOW (ref >60)
GFR, EST NON AFRICAN AMERICAN: 42 mL/min — AB (ref >60)
Glucose, Bld: 178 mg/dL — ABNORMAL HIGH (ref 70–99)
Potassium: 4 mmol/L (ref 3.5–5.1)
Sodium: 137 mmol/L (ref 135–145)

## 2018-02-06 LAB — URINALYSIS, COMPLETE (UACMP) WITH MICROSCOPIC
Bacteria, UA: NONE SEEN
Bilirubin Urine: NEGATIVE
GLUCOSE, UA: 50 mg/dL — AB
Ketones, ur: NEGATIVE mg/dL
Nitrite: NEGATIVE
PH: 7 (ref 5.0–8.0)
Protein, ur: NEGATIVE mg/dL
Specific Gravity, Urine: 1.009 (ref 1.005–1.030)
Squamous Epithelial / HPF: NONE SEEN (ref 0–5)

## 2018-02-06 LAB — CBC
HCT: 36.7 % (ref 36.0–46.0)
Hemoglobin: 12.2 g/dL (ref 12.0–15.0)
MCH: 32.1 pg (ref 26.0–34.0)
MCHC: 33.2 g/dL (ref 30.0–36.0)
MCV: 96.6 fL (ref 80.0–100.0)
PLATELETS: 272 10*3/uL (ref 150–400)
RBC: 3.8 MIL/uL — ABNORMAL LOW (ref 3.87–5.11)
RDW: 13 % (ref 11.5–15.5)
WBC: 13.4 10*3/uL — AB (ref 4.0–10.5)
nRBC: 0 % (ref 0.0–0.2)

## 2018-02-06 LAB — HEMOGLOBIN A1C
Hgb A1c MFr Bld: 6.1 % — ABNORMAL HIGH (ref 4.8–5.6)
MEAN PLASMA GLUCOSE: 128.37 mg/dL

## 2018-02-06 SURGERY — FIXATION, FRACTURE, INTERTROCHANTERIC, WITH INTRAMEDULLARY ROD
Anesthesia: Spinal | Site: Hip | Laterality: Left

## 2018-02-06 MED ORDER — METOCLOPRAMIDE HCL 10 MG PO TABS: 5.0000 mg | ORAL_TABLET | Freq: Three times a day (TID) | ORAL | Status: DC | PRN

## 2018-02-06 MED ORDER — HYDROMORPHONE HCL 1 MG/ML IJ SOLN: 0.5000 mg | INTRAMUSCULAR | Status: DC | PRN

## 2018-02-06 MED ORDER — ACETAMINOPHEN 325 MG PO TABS: 325.0000 mg | ORAL_TABLET | Freq: Four times a day (QID) | ORAL | Status: DC | PRN

## 2018-02-06 MED ORDER — METOCLOPRAMIDE HCL 10 MG PO TABS
10.0000 mg | ORAL_TABLET | Freq: Three times a day (TID) | ORAL | Status: DC
  Administered 2018-02-06 – 2018-02-08 (×6): 10 mg via ORAL
  Filled 2018-02-06 (×6): qty 1

## 2018-02-06 MED ORDER — ACETAMINOPHEN 325 MG PO TABS
325.0000 mg | ORAL_TABLET | Freq: Four times a day (QID) | ORAL | Status: DC | PRN
  Administered 2018-02-08: 650 mg via ORAL
  Filled 2018-02-06 (×2): qty 2

## 2018-02-06 MED ORDER — TRAMADOL HCL 50 MG PO TABS
50.0000 mg | ORAL_TABLET | ORAL | Status: DC | PRN
  Administered 2018-02-06 – 2018-02-09 (×5): 50 mg via ORAL
  Filled 2018-02-06 (×5): qty 1

## 2018-02-06 MED ORDER — PANTOPRAZOLE SODIUM 40 MG PO TBEC
40.0000 mg | DELAYED_RELEASE_TABLET | Freq: Every day | ORAL | Status: DC
  Administered 2018-02-07 – 2018-02-09 (×3): 40 mg via ORAL
  Filled 2018-02-06 (×3): qty 1

## 2018-02-06 MED ORDER — PROPOFOL 10 MG/ML IV BOLUS
INTRAVENOUS | Status: AC
  Filled 2018-02-06: qty 20

## 2018-02-06 MED ORDER — ACETAMINOPHEN 10 MG/ML IV SOLN
INTRAVENOUS | Status: DC | PRN
  Administered 2018-02-06: 1000 mg via INTRAVENOUS

## 2018-02-06 MED ORDER — SENNOSIDES-DOCUSATE SODIUM 8.6-50 MG PO TABS
1.0000 | ORAL_TABLET | Freq: Two times a day (BID) | ORAL | Status: DC
  Administered 2018-02-06 – 2018-02-08 (×3): 1 via ORAL
  Filled 2018-02-06 (×4): qty 1

## 2018-02-06 MED ORDER — ONDANSETRON HCL 4 MG PO TABS: 4.0000 mg | ORAL_TABLET | Freq: Four times a day (QID) | ORAL | Status: DC | PRN

## 2018-02-06 MED ORDER — CEFAZOLIN SODIUM-DEXTROSE 1-4 GM/50ML-% IV SOLN
INTRAVENOUS | Status: AC
  Filled 2018-02-06: qty 50

## 2018-02-06 MED ORDER — METOCLOPRAMIDE HCL 5 MG/ML IJ SOLN: 5.0000 mg | Freq: Three times a day (TID) | INTRAMUSCULAR | Status: DC | PRN

## 2018-02-06 MED ORDER — MAGNESIUM HYDROXIDE 400 MG/5ML PO SUSP
30.0000 mL | Freq: Every day | ORAL | Status: DC | PRN
  Administered 2018-02-07: 30 mL via ORAL
  Filled 2018-02-06: qty 30

## 2018-02-06 MED ORDER — FENTANYL CITRATE (PF) 100 MCG/2ML IJ SOLN
INTRAMUSCULAR | Status: DC | PRN
  Administered 2018-02-06 (×2): 25 ug via INTRAVENOUS

## 2018-02-06 MED ORDER — MIDAZOLAM HCL 2 MG/2ML IJ SOLN
INTRAMUSCULAR | Status: AC
  Filled 2018-02-06: qty 2

## 2018-02-06 MED ORDER — FLEET ENEMA 7-19 GM/118ML RE ENEM: 1.0000 | ENEMA | Freq: Once | RECTAL | Status: DC | PRN

## 2018-02-06 MED ORDER — PROPOFOL 10 MG/ML IV BOLUS
INTRAVENOUS | Status: DC | PRN
  Administered 2018-02-06 (×2): 10 mg via INTRAVENOUS
  Administered 2018-02-06: 20 mg via INTRAVENOUS
  Administered 2018-02-06: 10 mg via INTRAVENOUS
  Administered 2018-02-06: 20 mg via INTRAVENOUS

## 2018-02-06 MED ORDER — PHENYLEPHRINE HCL-NACL 10-0.9 MG/250ML-% IV SOLN
INTRAVENOUS | Status: DC | PRN
  Administered 2018-02-06: 30 ug/min via INTRAVENOUS

## 2018-02-06 MED ORDER — MENTHOL 3 MG MT LOZG
1.0000 | LOZENGE | OROMUCOSAL | Status: DC | PRN
  Filled 2018-02-06: qty 9

## 2018-02-06 MED ORDER — PROPOFOL 500 MG/50ML IV EMUL
INTRAVENOUS | Status: DC | PRN
  Administered 2018-02-06: 30 ug/kg/min via INTRAVENOUS

## 2018-02-06 MED ORDER — ENOXAPARIN SODIUM 40 MG/0.4ML ~~LOC~~ SOLN: 40.0000 mg | SUBCUTANEOUS | Status: DC

## 2018-02-06 MED ORDER — LIDOCAINE HCL (CARDIAC) PF 100 MG/5ML IV SOSY
PREFILLED_SYRINGE | INTRAVENOUS | Status: DC | PRN
  Administered 2018-02-06: 40 mg via INTRAVENOUS

## 2018-02-06 MED ORDER — PHENOL 1.4 % MT LIQD
1.0000 | OROMUCOSAL | Status: DC | PRN
  Filled 2018-02-06: qty 177

## 2018-02-06 MED ORDER — CEFAZOLIN SODIUM-DEXTROSE 1-4 GM/50ML-% IV SOLN
INTRAVENOUS | Status: DC | PRN
  Administered 2018-02-06: 1 g via INTRAVENOUS

## 2018-02-06 MED ORDER — FERROUS SULFATE 325 (65 FE) MG PO TABS
325.0000 mg | ORAL_TABLET | Freq: Two times a day (BID) | ORAL | Status: DC
  Administered 2018-02-07 – 2018-02-09 (×5): 325 mg via ORAL
  Filled 2018-02-06 (×5): qty 1

## 2018-02-06 MED ORDER — CEFAZOLIN SODIUM-DEXTROSE 2-4 GM/100ML-% IV SOLN
2.0000 g | Freq: Four times a day (QID) | INTRAVENOUS | Status: AC
  Administered 2018-02-06 – 2018-02-07 (×4): 2 g via INTRAVENOUS
  Filled 2018-02-06 (×5): qty 100

## 2018-02-06 MED ORDER — SODIUM CHLORIDE 0.9 % IV SOLN
INTRAVENOUS | Status: DC | PRN
  Administered 2018-02-06: 15:00:00

## 2018-02-06 MED ORDER — ONDANSETRON HCL 4 MG/2ML IJ SOLN: 4.0000 mg | Freq: Once | INTRAMUSCULAR | Status: DC | PRN

## 2018-02-06 MED ORDER — KETAMINE HCL 50 MG/ML IJ SOLN
INTRAMUSCULAR | Status: DC | PRN
  Administered 2018-02-06: 50 mg via INTRAVENOUS

## 2018-02-06 MED ORDER — KETAMINE HCL 50 MG/ML IJ SOLN
INTRAMUSCULAR | Status: AC
  Filled 2018-02-06: qty 10

## 2018-02-06 MED ORDER — ENOXAPARIN SODIUM 30 MG/0.3ML ~~LOC~~ SOLN
30.0000 mg | SUBCUTANEOUS | Status: DC
  Administered 2018-02-07: 30 mg via SUBCUTANEOUS
  Filled 2018-02-06 (×2): qty 0.3

## 2018-02-06 MED ORDER — CEFAZOLIN SODIUM-DEXTROSE 2-4 GM/100ML-% IV SOLN: 2.0000 g | INTRAVENOUS | Status: DC

## 2018-02-06 MED ORDER — ONDANSETRON HCL 4 MG/2ML IJ SOLN
4.0000 mg | Freq: Four times a day (QID) | INTRAMUSCULAR | Status: DC | PRN
  Administered 2018-02-06: 4 mg via INTRAVENOUS

## 2018-02-06 MED ORDER — DILTIAZEM HCL ER COATED BEADS 180 MG PO CP24
360.0000 mg | ORAL_CAPSULE | Freq: Every day | ORAL | Status: DC
  Administered 2018-02-06 – 2018-02-09 (×4): 360 mg via ORAL
  Filled 2018-02-06 (×4): qty 2

## 2018-02-06 MED ORDER — OXYCODONE HCL 5 MG PO TABS
5.0000 mg | ORAL_TABLET | ORAL | Status: DC | PRN
  Administered 2018-02-07 – 2018-02-08 (×5): 5 mg via ORAL
  Filled 2018-02-06 (×5): qty 1

## 2018-02-06 MED ORDER — ACETAMINOPHEN 10 MG/ML IV SOLN
1000.0000 mg | Freq: Four times a day (QID) | INTRAVENOUS | Status: AC
  Administered 2018-02-06 – 2018-02-07 (×4): 1000 mg via INTRAVENOUS
  Filled 2018-02-06 (×4): qty 100

## 2018-02-06 MED ORDER — SODIUM CHLORIDE 0.9 % IV SOLN
INTRAVENOUS | Status: DC
  Administered 2018-02-06 – 2018-02-07 (×2): via INTRAVENOUS

## 2018-02-06 MED ORDER — ONDANSETRON HCL 4 MG/2ML IJ SOLN
INTRAMUSCULAR | Status: AC
  Administered 2018-02-06: 18:00:00
  Filled 2018-02-06: qty 2

## 2018-02-06 MED ORDER — FENTANYL CITRATE (PF) 100 MCG/2ML IJ SOLN: 25.0000 ug | INTRAMUSCULAR | Status: DC | PRN

## 2018-02-06 MED ORDER — SODIUM CHLORIDE 0.9 % IV SOLN
INTRAVENOUS | Status: DC
  Administered 2018-02-06: 14:00:00 via INTRAVENOUS

## 2018-02-06 MED ORDER — GENTAMICIN SULFATE 40 MG/ML IJ SOLN
INTRAMUSCULAR | Status: AC
  Filled 2018-02-06: qty 2

## 2018-02-06 MED ORDER — FENTANYL CITRATE (PF) 100 MCG/2ML IJ SOLN
INTRAMUSCULAR | Status: AC
  Filled 2018-02-06: qty 2

## 2018-02-06 MED ORDER — BISACODYL 10 MG RE SUPP: 10.0000 mg | Freq: Every day | RECTAL | Status: DC | PRN

## 2018-02-06 MED ORDER — ACETAMINOPHEN 10 MG/ML IV SOLN
INTRAVENOUS | Status: AC
  Filled 2018-02-06: qty 100

## 2018-02-06 SURGICAL SUPPLY — 61 items
BANDAGE ACE 6X5 VEL STRL LF (GAUZE/BANDAGES/DRESSINGS) ×4 IMPLANT
BANDAGE ELASTIC 4 LF NS (GAUZE/BANDAGES/DRESSINGS) ×4 IMPLANT
BIT DRILL FLUTED FEMUR 4.2/3 (BIT) ×4 IMPLANT
BLADE TFNA HELICAL 90 STERILE (Anchor) ×4 IMPLANT
CANISTER SUCT 1200ML W/VALVE (MISCELLANEOUS) ×8 IMPLANT
CHLORAPREP W/TINT 26ML (MISCELLANEOUS) ×8 IMPLANT
COVER WAND RF STERILE (DRAPES) ×8 IMPLANT
DRAPE C-ARM XRAY 36X54 (DRAPES) ×4 IMPLANT
DRAPE C-ARMOR (DRAPES) ×8 IMPLANT
DRAPE INCISE IOBAN 66X45 STRL (DRAPES) ×8 IMPLANT
DRAPE SHEET LG 3/4 BI-LAMINATE (DRAPES) ×4 IMPLANT
DRSG DERMACEA 8X12 NADH (GAUZE/BANDAGES/DRESSINGS) ×4 IMPLANT
DRSG OPSITE POSTOP 3X4 (GAUZE/BANDAGES/DRESSINGS) ×8 IMPLANT
DRSG OPSITE POSTOP 4X12 (GAUZE/BANDAGES/DRESSINGS) ×4 IMPLANT
DRSG OPSITE POSTOP 4X6 (GAUZE/BANDAGES/DRESSINGS) ×8 IMPLANT
DURAPREP 26ML APPLICATOR (WOUND CARE) ×4 IMPLANT
ELECT CAUTERY BLADE 6.4 (BLADE) ×4 IMPLANT
ELECT REM PT RETURN 9FT ADLT (ELECTROSURGICAL) ×8
ELECTRODE REM PT RTRN 9FT ADLT (ELECTROSURGICAL) ×4 IMPLANT
GAUZE PETRO XEROFOAM 1X8 (MISCELLANEOUS) ×4 IMPLANT
GAUZE SPONGE 4X4 12PLY STRL (GAUZE/BANDAGES/DRESSINGS) ×8 IMPLANT
GLOVE BIO SURGEON STRL SZ8 (GLOVE) ×12 IMPLANT
GLOVE BIOGEL M STRL SZ7.5 (GLOVE) ×4 IMPLANT
GLOVE INDICATOR 8.0 STRL GRN (GLOVE) ×8 IMPLANT
GOWN STRL REUS W/ TWL LRG LVL3 (GOWN DISPOSABLE) ×6 IMPLANT
GOWN STRL REUS W/ TWL XL LVL3 (GOWN DISPOSABLE) ×2 IMPLANT
GOWN STRL REUS W/TWL LRG LVL3 (GOWN DISPOSABLE) ×6
GOWN STRL REUS W/TWL XL LVL3 (GOWN DISPOSABLE) ×2
GUIDEWIRE 3.2X400 (WIRE) ×4 IMPLANT
HOLDER FOLEY CATH W/STRAP (MISCELLANEOUS) ×4 IMPLANT
IMPL DEG TI CANN 11MM/130 (Orthopedic Implant) ×2 IMPLANT
IMPLANT DEG TI CANN 11MM/130 (Orthopedic Implant) ×4 IMPLANT
KIT TURNOVER CYSTO (KITS) ×4 IMPLANT
KIT TURNOVER KIT A (KITS) ×4 IMPLANT
MAT ABSORB  FLUID 56X50 GRAY (MISCELLANEOUS) ×4
MAT ABSORB FLUID 56X50 GRAY (MISCELLANEOUS) ×4 IMPLANT
NEEDLE FILTER BLUNT 18X 1/2SAF (NEEDLE) ×2
NEEDLE FILTER BLUNT 18X1 1/2 (NEEDLE) ×2 IMPLANT
NS IRRIG 1000ML POUR BTL (IV SOLUTION) ×8 IMPLANT
PACK HIP COMPR (MISCELLANEOUS) ×4 IMPLANT
PACK TOTAL KNEE (MISCELLANEOUS) ×4 IMPLANT
PAD ABD DERMACEA PRESS 5X9 (GAUZE/BANDAGES/DRESSINGS) ×4 IMPLANT
PADDING CAST 4IN STRL (MISCELLANEOUS) ×4
PADDING CAST BLEND 4X4 STRL (MISCELLANEOUS) ×4 IMPLANT
PADDING CAST BLEND 6X4 STRL (MISCELLANEOUS) ×4 IMPLANT
PADDING STRL CAST 6IN (MISCELLANEOUS) ×4
REAMER ROD DEEP FLUTE 2.5X950 (INSTRUMENTS) ×4 IMPLANT
SCREW LOCKING 5.0X34MM (Screw) ×4 IMPLANT
SOL PREP PVP 2OZ (MISCELLANEOUS) ×4
SOLUTION PREP PVP 2OZ (MISCELLANEOUS) ×2 IMPLANT
STAPLER SKIN PROX 35W (STAPLE) ×8 IMPLANT
SUCTION FRAZIER HANDLE 10FR (MISCELLANEOUS) ×2
SUCTION TUBE FRAZIER 10FR DISP (MISCELLANEOUS) ×2 IMPLANT
SUT TICRON 2-0 30IN 311381 (SUTURE) ×4 IMPLANT
SUT VIC AB 0 CT1 36 (SUTURE) ×8 IMPLANT
SUT VIC AB 1 CT1 36 (SUTURE) ×4 IMPLANT
SUT VIC AB 2-0 CT1 27 (SUTURE) ×4
SUT VIC AB 2-0 CT1 TAPERPNT 27 (SUTURE) ×4 IMPLANT
SYR 10ML LL (SYRINGE) ×4 IMPLANT
TAPE MICROFOAM 4IN (TAPE) ×4 IMPLANT
TRAY FOLEY MTR SLVR 16FR STAT (SET/KITS/TRAYS/PACK) ×4 IMPLANT

## 2018-02-06 NOTE — Anesthesia Post-op Follow-up Note (Signed)
Anesthesia QCDR form completed.        

## 2018-02-06 NOTE — Anesthesia Procedure Notes (Signed)
Spinal  Patient location during procedure: OR Start time: 02/06/2018 2:00 PM End time: 02/06/2018 2:03 PM Staffing Resident/CRNA: Bernardo Heater, CRNA Performed: resident/CRNA  Preanesthetic Checklist Completed: patient identified, site marked, surgical consent, pre-op evaluation, timeout performed, IV checked, risks and benefits discussed and monitors and equipment checked Spinal Block Patient position: sitting Prep: ChloraPrep Patient monitoring: heart rate, continuous pulse ox, blood pressure and cardiac monitor Approach: midline Location: L3-4 Injection technique: single-shot Needle Needle type: Introducer and Pencan  Needle gauge: 24 G Needle length: 9 cm Additional Notes Negative paresthesia. Negative blood return. Positive free-flowing CSF. Expiration date of kit checked and confirmed. Patient tolerated procedure well, without complications.

## 2018-02-06 NOTE — NC FL2 (Signed)
[place] MEDICAID FL2 LEVEL OF CARE SCREENING TOOL     IDENTIFICATION  Patient Name: Vanessa Brock Birthdate: 06-21-1922 Sex: female Admission Date (Current Location): 02/05/2018  [place] and [place] Number:  Chiropodist and Address:  [facility], [address] [place], [place] [address]      Provider Number: [id number]  Attending Physician Name and Address:  Alford Highland, MD  Relative Name and Phone Number:       Current Level of Care: Hospital Recommended Level of Care: Skilled Nursing Facility Prior Approval Number:    Date Approved/Denied:   PASRR Number: [id number])  Discharge Plan: SNF    Current Diagnoses: Patient Active Problem List   Diagnosis Date Noted  . Hip fracture (HCC) 02/05/2018    Orientation RESPIRATION BLADDER Height & Weight     Self, Time, Situation, Place  Normal Continent Weight: 87 lb 4.8 oz (39.6 kg) Height:  5\' 8"  (172.7 cm)  BEHAVIORAL SYMPTOMS/MOOD NEUROLOGICAL BOWEL NUTRITION STATUS      Continent Diet(Diet: NPO for surgery to be advanced. )  AMBULATORY STATUS COMMUNICATION OF NEEDS Skin   Extensive Assist Verbally Surgical wounds(Incision: Left Hip. )                       Personal Care Assistance Level of Assistance  Bathing, Feeding, Dressing Bathing Assistance: Limited assistance Feeding assistance: Independent Dressing Assistance: Limited assistance     Functional Limitations Info  Sight, Hearing, Speech Sight Info: Adequate Hearing Info: Adequate Speech Info: Adequate    SPECIAL CARE FACTORS FREQUENCY  PT (By licensed PT), OT (By licensed OT)     PT Frequency: (5) OT Frequency: (5)            Contractures      Additional Factors Info  Code Status, Allergies Code Status Info: (DNR ) Allergies Info: (Aspirin, Sulfur)           Current Medications (02/06/2018):  This is the current hospital active medication list Current Facility-Administered Medications   Medication Dose Route Frequency Provider Last Rate Last Dose  . 0.9 %  sodium chloride infusion   Intravenous Continuous Naomie Dean, MD      . Mitzi Hansen Hold] acetaminophen (TYLENOL) tablet 650 mg  650 mg Oral Q6H PRN Adrian Saran, MD       Or  . Mitzi Hansen Hold] acetaminophen (TYLENOL) suppository 650 mg  650 mg Rectal Q6H PRN Adrian Saran, MD      . Mitzi Hansen Hold] bisacodyl (DULCOLAX) EC tablet 5 mg  5 mg Oral Daily PRN Adrian Saran, MD      . Mitzi Hansen Hold] calcium-vitamin D (OSCAL WITH D) 500-200 MG-UNIT per tablet 1 tablet  1 tablet Oral BID Mody, Sital, MD      . ceFAZolin (ANCEF) IVPB 2g/100 mL premix  2 g Intravenous To OR Hooten, Illene Labrador, MD      . Mitzi Hansen Hold] diltiazem (CARDIZEM CD) 24 hr capsule 360 mg  360 mg Oral Daily Adrian Saran, MD   360 mg at 02/06/18 0914  . fentaNYL (SUBLIMAZE) injection 25 mcg  25 mcg Intravenous Q5 min PRN Naomie Dean, MD      . Mitzi Hansen Hold] HYDROcodone-acetaminophen (NORCO/VICODIN) 5-325 MG per tablet 1-2 tablet  1-2 tablet Oral Q4H PRN Adrian Saran, MD   1 tablet at 02/06/18 1114  . [MAR Hold] losartan (COZAAR) tablet 50 mg  50 mg Oral Daily Adrian Saran, MD   50 mg at 02/06/18  0973  Mitzi Hansen Hold] morphine 2 MG/ML injection 2 mg  2 mg Intravenous Q4H PRN Adrian Saran, MD   2 mg at 02/06/18 1328  . [MAR Hold] multivitamin with minerals tablet 1 tablet  1 tablet Oral Daily Adrian Saran, MD      . Mitzi Hansen Hold] ondansetron (ZOFRAN) tablet 4 mg  4 mg Oral Q6H PRN Adrian Saran, MD       Or  . Mitzi Hansen Hold] ondansetron (ZOFRAN) injection 4 mg  4 mg Intravenous Q6H PRN Mody, Sital, MD      . ondansetron (ZOFRAN) injection 4 mg  4 mg Intravenous Once PRN Naomie Dean, MD      . Mitzi Hansen Hold] pantoprazole (PROTONIX) EC tablet 40 mg  40 mg Oral Daily Mody, Sital, MD      . Mitzi Hansen Hold] polyethylene glycol (MIRALAX / GLYCOLAX) packet 17 g  17 g Oral Daily PRN Adrian Saran, MD      . Mitzi Hansen Hold] sodium phosphate (FLEET) 7-19 GM/118ML enema 1 enema  1 enema Rectal Once PRN Adrian Saran, MD          Discharge Medications: Please see discharge summary for a list of discharge medications.  Relevant Imaging Results:  Relevant Lab Results:   Additional Information (SSN: [id number])  Jasneet Schobert, Darleen Crocker, LCSW

## 2018-02-06 NOTE — Clinical Social Work Placement (Signed)
   CLINICAL SOCIAL WORK PLACEMENT  NOTE  Date:  02/06/2018  Patient Details  Name: WEDA LEVINE MRN: [id number] Date of Birth: Aug 11, 1922  Clinical Social Work is seeking post-discharge placement for this patient at the Skilled  Nursing Facility level of care (*CSW will initial, date and re-position this form in  chart as items are completed):  Yes   Patient/family provided with [facility] Clinical Social Work Department's list of facilities offering this level of care within the geographic area requested by the patient (or if unable, by the patient's family).  Yes   Patient/family informed of their freedom to choose among providers that offer the needed level of care, that participate in Medicare, Medicaid or managed care program needed by the patient, have an available bed and are willing to accept the patient.  Yes   Patient/family informed of [facility]'s ownership interest in [facility] and [facility], as well as of the fact that they are under no obligation to receive care at these facilities.  PASRR submitted to EDS on 02/06/18     PASRR number received on 02/06/18     Existing PASRR number confirmed on       FL2 transmitted to all facilities in geographic area requested by pt/family on 02/06/18     FL2 transmitted to all facilities within larger geographic area on       Patient informed that his/her managed care company has contracts with or will negotiate with certain facilities, including the following:            Patient/family informed of bed offers received.  Patient chooses bed at       Physician recommends and patient chooses bed at      Patient to be transferred to   on  .  Patient to be transferred to facility by       Patient family notified on   of transfer.  Name of family member notified:        PHYSICIAN       Additional Comment:    _______________________________________________ Ranya Fiddler, Darleen Crocker, LCSW 02/06/2018, 4:31 PM

## 2018-02-06 NOTE — Transfer of Care (Signed)
Immediate Anesthesia Transfer of Care Note  Patient: SARAFINA MIKES  Procedure(s) Performed: INTRAMEDULLARY (IM) NAIL INTERTROCHANTRIC (Left Hip)  Patient Location: PACU  Anesthesia Type:Spinal  Level of Consciousness: awake and alert   Airway & Oxygen Therapy: Patient Spontanous Breathing  Post-op Assessment: Report given to RN and Post -op Vital signs reviewed and stable  Post vital signs: Reviewed and stable  Last Vitals:  Vitals Value Taken Time  BP 98/39 02/06/2018  4:06 PM  Temp 36.2 C 02/06/2018  4:06 PM  Pulse 61 02/06/2018  4:07 PM  Resp 13 02/06/2018  4:07 PM  SpO2 96 % 02/06/2018  4:07 PM  Vitals shown include unvalidated device data.  Last Pain:  Vitals:   02/06/18 1606  TempSrc: Temporal  PainSc:          Complications: No apparent anesthesia complications

## 2018-02-06 NOTE — Op Note (Signed)
OPERATIVE NOTE  DATE OF SURGERY:  02/06/2018  PATIENT NAME:  Vanessa Brock   DOB: May 05, 1922  MRN: [id number]  PRE-OPERATIVE DIAGNOSIS: Left intertrochanteric femur fracture  POST-OPERATIVE DIAGNOSIS:  Same  PROCEDURE: Open reduction and internal fixation of a left intertrochanteric femur fracture   SURGEON:  Jena Gauss. M.D.  ANESTHESIA: spinal  ESTIMATED BLOOD LOSS: 20 mL  FLUIDS REPLACED: 600 mL of crystalloid  DRAINS: None  IMPLANTS UTILIZED: Synthes 11 mm/130 trochanteric fixation nail, 90 mm helical blade, 34 mm x 5.0 mm locking screw  INDICATIONS FOR SURGERY: [name] is a 85 y.o. year old female who fell and sustained a displaced left intertrochanteric femur fracture. After discussion of the risks and benefits of surgical intervention, the patient expressed understanding of the risks benefits and agree with plans for open reduction and internal fixation.   The risks, benefits, and alternatives were discussed at length including but not limited to the risks of infection, bleeding, nerve injury, stiffness, blood clots, the need for revision surgery, limb length inequality, cardiopulmonary complications, among others, and they were willing to proceed.  PROCEDURE IN DETAIL: The patient was brought into the operating room and, after adequate spinal anesthesia was achieved, patient was placed on the fracture table. All bony prominences were well-padded. The left lower extremity was placed in traction and a provisional reduction was performed and verified using the C-arm. The patient's left hip and leg were cleaned and prepped with alcohol and DuraPrep and draped in the usual sterile fashion. A "timeout" was performed as per usual protocol. A lateral incision was made extended from the proximal portion of the greater trochanter proximally. The fascia was incised in line with the skin incision and the fibers of the hip abductors were split in line. The tip of the greater trochanter  was palpated and a distally threaded guide pin was inserted into the tip of the greater trochanter and advanced into the medullary canal. Position was confirmed in both AP and lateral planes using the C-arm. A pilot hole was enlarged using a step drill. A Synthes 11 mm/130 trochanteric fixation nail was advanced over the guidepin and position confirmed using the C-arm. A second stab incision was made and the tissue protector was inserted through the outrigger device and advanced to the lateral cortex of the femur. A threaded screw guide pin was inserted into the femoral neck and head and position was again confirmed in both AP and lateral planes. Vision was were obtained and it was felt that a 90 mm helical blade was appropriate. The cortex was reamed and then a cannulated reamer was advanced over the guidepin to the appropriate depth. A 90 mm helical blade was then advanced over the guidepin and impacted into place. Good position was noted in multiple planes using the C-arm. The locking sleeve was engaged. Finally, a third stab incision was made and the tissue protector was inserted through the outrigger device and advanced the lateral cortex of the femur for placement of the distal locking screw. A 34 mm x 5.0 mm locking screw was then inserted. The outrigger device was removed. The hip was visualized in all planes using the C-arm with good reduction appreciated and good position of the hardware noted.  The wound was irrigated with copious amounts of normal saline with antibiotic solution and suctioned dry. Good hemostasis was appreciated. The fascia was reapproximated using interrupted sutures of #1 Vicryl. Subcutaneous tissue was approximated layers using first #0 Vicryl followed #2-0 Vicryl.  The skin was closed with skin staples. A sterile dressing was applied.  The patient tolerated the procedure well and was transported to the recovery room in stable condition.   Jena Gauss., M.D.

## 2018-02-06 NOTE — Progress Notes (Signed)
Pt has not voided all night long bladder scan showing around 200ml. Spoke with Dr. Allena KatzPatel he would like to do an in and out cath.

## 2018-02-06 NOTE — Progress Notes (Signed)
Patient ID: Vanessa Brock, female   DOB: 1922-01-13, 85 y.o.   MRN: [id number]  [employer] PROGRESS NOTE  Vanessa Brock:[id number] DOB: 10-17-1922 DOA: 02/05/2018 PCP: Patrice Paradise, MD  HPI/Subjective: Patient had a fall going down a step into the garage.  She stated it was a low step and she lost her balance.  No complaints of chest pain or shortness of breath.  Pain after pain medications on her left hip 5-6 out of 10.  Less pain if she is not moving.  More pain if she tries to move it.  Objective: Vitals:   02/05/18 2248 02/06/18 0720  BP: (!) 158/66 (!) 153/60  Pulse: 88 94  Resp: 20 18  Temp:  98.9 F (37.2 C)  SpO2: 98% 97%    Filed Weights   02/05/18 2004  Weight: 39.6 kg    ROS: Review of Systems  Constitutional: Negative for chills and fever.  Eyes: Negative for blurred vision.  Respiratory: Negative for cough and shortness of breath.   Cardiovascular: Negative for chest pain.  Gastrointestinal: Negative for abdominal pain, constipation, diarrhea, nausea and vomiting.  Genitourinary: Negative for dysuria.  Musculoskeletal: Positive for joint pain.  Neurological: Negative for dizziness and headaches.   Exam: Physical Exam  HENT:  Nose: No mucosal edema.  Mouth/Throat: No oropharyngeal exudate or posterior oropharyngeal edema.  Eyes: Pupils are equal, round, and reactive to light. Conjunctivae, EOM and lids are normal.  Neck: No JVD present. Carotid bruit is not present. No edema present. No thyroid mass and no thyromegaly present.  Cardiovascular: S1 normal and S2 normal. Exam reveals no gallop.  No murmur heard. Pulses:      Dorsalis pedis pulses are 2+ on the right side and 2+ on the left side.  Respiratory: No respiratory distress. She has no wheezes. She has no rhonchi. She has no rales.  GI: Soft. Bowel sounds are normal. There is no abdominal tenderness.  Musculoskeletal:     Right shoulder: She exhibits no swelling.  Lymphadenopathy:    She  has no cervical adenopathy.  Neurological: She is alert. No cranial nerve deficit.  Skin: Skin is warm. No rash noted. Nails show no clubbing.  Psychiatric: She has a normal mood and affect.      Data Reviewed: Basic Metabolic Panel: Recent Labs  Lab 02/05/18 1619 02/06/18 0342  NA 138 137  K 3.6 4.0  CL 108 104  CO2 24 29  GLUCOSE 226* 178*  BUN 27* 25*  CREATININE 1.37* 1.12*  CALCIUM 7.9* 9.0   CBC: Recent Labs  Lab 02/05/18 1619 02/06/18 0342  WBC 17.7* 13.4*  NEUTROABS 15.3*  --   HGB 12.2 12.2  HCT 36.5 36.7  MCV 97.6 96.6  PLT 276 272     Recent Results (from the past 240 hour(s))  Surgical pcr screen     Status: None   Collection Time: 02/05/18  8:17 PM  Result Value Ref Range Status   MRSA, PCR NEGATIVE NEGATIVE Final   Staphylococcus aureus NEGATIVE NEGATIVE Final    Comment: (NOTE) The Xpert SA Assay (FDA approved for NASAL specimens in patients 28 years of age and older), is one component of a comprehensive surveillance program. It is not intended to diagnose infection nor to guide or monitor treatment. Performed at [facility], [address] [place], [place] [address]      Studies: Dg Hip Unilat With Pelvis 2-3 Views Left  Result Date: 02/05/2018 CLINICAL DATA:  Fall. EXAM: DG HIP (WITH OR WITHOUT PELVIS) 2-3V LEFT COMPARISON:  None. FINDINGS: Frontal pelvis shows diffuse bony demineralization. SI joints and symphysis pubis unremarkable. No evidence for pubic ramus fracture. AP and cross-table lateral views of the left hip show an intertrochanteric left femoral neck fracture with varus angulation. IMPRESSION: Intertrochanteric left femoral neck fracture with varus angulation. Electronically Signed   By: Kennith Center M.D.   On: 02/05/2018 15:52    Scheduled Meds: . calcium-vitamin D  1 tablet Oral BID  . diltiazem  360 mg Oral Daily  . losartan  50 mg Oral Daily  . multivitamin with minerals  1 tablet Oral Daily  . pantoprazole   40 mg Oral Daily   Continuous Infusions: . sodium chloride 75 mL/hr at 02/05/18 2339  .  ceFAZolin (ANCEF) IV Stopped (02/05/18 2021)    Assessment/Plan:  1. Preoperative consultation for left femoral neck fracture.  No contraindications to surgery at this time.  Mortality high within the first year of hip fracture especially if we do not get up and walk again.  The patient normally walks without any devices.  Pain control.  Orthopedic to do surgery today. 2. Essential hypertension on Cozaar and diltiazem 3. GERD on PPI 4. Acute kidney injury on chronic kidney disease stage III.  Monitor with gentle IV fluids. 5. Impaired fasting glucose.  Add on hemoglobin A1c.  Code Status:     Code Status Orders  (From admission, onward)         Start     Ordered   02/05/18 1957  Do not attempt resuscitation (DNR)  Continuous    Question Answer Comment  In the event of cardiac or respiratory ARREST Do not call a "code blue"   In the event of cardiac or respiratory ARREST Do not perform Intubation, CPR, defibrillation or ACLS   In the event of cardiac or respiratory ARREST Use medication by any route, position, wound care, and other measures to relive pain and suffering. May use oxygen, suction and manual treatment of airway obstruction as needed for comfort.      02/05/18 1956        Code Status History    This patient has a current code status but no historical code status.     Family Communication: Spoke with son at the bedside Disposition Plan: Likely will need rehab postoperatively day 2 or 3.  Consultants:  Orthopedic surgery  Time spent: 28 minutes  Lamonte Hartt [employer]

## 2018-02-06 NOTE — Plan of Care (Signed)
  Problem: Education: Goal: Verbalization of understanding the information provided (i.e., activity precautions, restrictions, etc) will improve Outcome: Progressing   Problem: Clinical Measurements: Goal: Postoperative complications will be avoided or minimized Outcome: Progressing   Problem: Self-Concept: Goal: Ability to maintain and perform role responsibilities to the fullest extent possible will improve Outcome: Progressing   Problem: Pain Management: Goal: Pain level will decrease Outcome: Progressing   

## 2018-02-06 NOTE — Clinical Social Work Note (Signed)
Clinical Social Work Assessment  Patient Details  Name: Vanessa Brock MRN: [id number] Date of Birth: 1922/05/22  Date of referral:  02/06/18               Reason for consult:  Facility Placement                Permission sought to share information with:  Chartered certified accountant granted to share information::  Yes, Verbal Permission Granted  Name::      [place]::   [place]   Relationship::     Contact Information:     Housing/Transportation Living arrangements for the past 2 months:  [place] of Information:  Patient, Adult Children Patient Interpreter Needed:  None Criminal Activity/Legal Involvement Pertinent to Current Situation/Hospitalization:  No - Comment as needed Significant Relationships:  Adult Children Lives with:  Self Do you feel safe going back to the place where you live?  Yes Need for family participation in patient care:  Yes (Comment)  Care giving concerns:  Patient lives alone in [place].    Social Worker assessment / plan:  Holiday representative ([place]) reviewed chart and noted that patient has a hip fracture. Patient will have surgery today. PT is pending. CSW met with patient prior to surgery today and her 3 adult sons and 2 daughter in laws were at bedside. Patient was alert and oriented X3 and was laying in the bed. CSW introduced self and explained role of CSW department. Per adult children patient's daughter [name] is her HPOA. CSW explained that after surgery PT will evaluate patient and make a recommendation of home health or SNF. CSW explained that medicare requires a 3 night qualifying inpatient hospital stay in order to pay for SNF. Patient was admitted to inpatient 2/2. Patient and her sons prefer SNF and are agreeable to SNF search in [place]. FL2 complete and faxed out. CSW will continue to follow and assist as needed.   Employment status:  Disabled (Comment on whether or not  currently receiving Disability), Retired Forensic scientist:  Medicare PT Recommendations:  Not assessed at this time Information / Referral to community resources:  [place]  Patient/Family's Response to care:  Patient is agreeable to [facility] in [place].   Patient/Family's Understanding of and Emotional Response to Diagnosis, Current Treatment, and Prognosis:  Patient was very pleasant and thanked CSW for assistance.   Emotional Assessment Appearance:  Appears stated age Attitude/Demeanor/Rapport:    Affect (typically observed):  Accepting, Adaptable, Pleasant Orientation:  Oriented to Self, Oriented to Place, Oriented to  Time, Oriented to Situation Alcohol / Substance use:  Not Applicable Psych involvement (Current and /or in the community):  No (Comment)  Discharge Needs  Concerns to be addressed:  Discharge Planning Concerns Readmission within the last 30 days:  No Current discharge risk:  Dependent with Mobility Barriers to Discharge:  Continued Medical Work up   [employer], Veronia Beets, LCSW 02/06/2018, 4:33 PM

## 2018-02-06 NOTE — Anesthesia Preprocedure Evaluation (Signed)
Anesthesia Evaluation  Patient identified by MRN, date of birth, ID band Patient awake    Reviewed: Allergy & Precautions, NPO status , Patient's Chart, lab work & pertinent test results  History of Anesthesia Complications Negative for: history of anesthetic complications  Airway Mallampati: II       Dental  (+) Chipped   Pulmonary neg sleep apnea, neg COPD,           Cardiovascular hypertension, Pt. on medications      Neuro/Psych neg Seizures    GI/Hepatic Neg liver ROS, GERD  Medicated and Controlled,  Endo/Other  neg diabetes  Renal/GU negative Renal ROS     Musculoskeletal   Abdominal   Peds  Hematology   Anesthesia Other Findings   Reproductive/Obstetrics                             Anesthesia Physical Anesthesia Plan  ASA: II  Anesthesia Plan: Spinal   Post-op Pain Management:    Induction:   PONV Risk Score and Plan:   Airway Management Planned: Nasal Cannula  Additional Equipment:   Intra-op Plan:   Post-operative Plan:   Informed Consent: I have reviewed the patients History and Physical, chart, labs and discussed the procedure including the risks, benefits and alternatives for the proposed anesthesia with the patient or authorized representative who has indicated his/her understanding and acceptance.       Plan Discussed with:   Anesthesia Plan Comments:         Anesthesia Quick Evaluation

## 2018-02-07 ENCOUNTER — Encounter: Payer: Self-pay | Admitting: Orthopedic Surgery

## 2018-02-07 LAB — CBC
HCT: 30.4 % — ABNORMAL LOW (ref 36.0–46.0)
Hemoglobin: 10.4 g/dL — ABNORMAL LOW (ref 12.0–15.0)
MCH: 33.2 pg (ref 26.0–34.0)
MCHC: 34.2 g/dL (ref 30.0–36.0)
MCV: 97.1 fL (ref 80.0–100.0)
PLATELETS: 203 10*3/uL (ref 150–400)
RBC: 3.13 MIL/uL — ABNORMAL LOW (ref 3.87–5.11)
RDW: 13 % (ref 11.5–15.5)
WBC: 9.6 10*3/uL (ref 4.0–10.5)
nRBC: 0 % (ref 0.0–0.2)

## 2018-02-07 LAB — BASIC METABOLIC PANEL
Anion gap: 5 (ref 5–15)
BUN: 17 mg/dL (ref 8–23)
CO2: 26 mmol/L (ref 22–32)
Calcium: 8.5 mg/dL — ABNORMAL LOW (ref 8.9–10.3)
Chloride: 107 mmol/L (ref 98–111)
Creatinine, Ser: 0.81 mg/dL (ref 0.44–1.00)
GFR calc Af Amer: >60 mL/min (ref >60)
GFR calc non Af Amer: >60 mL/min (ref >60)
Glucose, Bld: 115 mg/dL — ABNORMAL HIGH (ref 70–99)
Potassium: 3.5 mmol/L (ref 3.5–5.1)
Sodium: 138 mmol/L (ref 135–145)

## 2018-02-07 LAB — URINE CULTURE: Culture: NO GROWTH

## 2018-02-07 MED ORDER — LACTULOSE 10 GM/15ML PO SOLN
30.0000 g | Freq: Every day | ORAL | Status: DC | PRN
  Administered 2018-02-07: 30 g via ORAL
  Filled 2018-02-07: qty 60

## 2018-02-07 NOTE — Progress Notes (Signed)
Patient ID: Vanessa Brock, female   DOB: 22-Nov-1922, 85 y.o.   MRN: [id number]  [employer] PROGRESS NOTE  Vanessa Brock WUJ:[id number] DOB: 22-Nov-1922 DOA: 02/05/2018 PCP: Patrice ParadiseMcLaughlin, Miriam K, MD  HPI/Subjective: Patient seen earlier sitting in the chair.  Some pain in the hip.  States she walked to the door and back.  Objective: Vitals:   02/07/18 0743 02/07/18 1200  BP: (!) 160/67 (!) 165/62  Pulse: 89 63  Resp: 16 19  Temp: 98.3 F (36.8 C) 98 F (36.7 C)  SpO2: 96% 95%    Filed Weights   02/05/18 2004  Weight: 39.6 kg    ROS: Review of Systems  Constitutional: Negative for chills and fever.  Eyes: Negative for blurred vision.  Respiratory: Negative for cough and shortness of breath.   Cardiovascular: Negative for chest pain.  Gastrointestinal: Positive for constipation. Negative for abdominal pain, diarrhea, nausea and vomiting.  Genitourinary: Negative for dysuria.  Musculoskeletal: Positive for joint pain.  Neurological: Negative for dizziness and headaches.   Exam: Physical Exam  HENT:  Nose: No mucosal edema.  Mouth/Throat: No oropharyngeal exudate or posterior oropharyngeal edema.  Eyes: Pupils are equal, round, and reactive to light. Conjunctivae, EOM and lids are normal.  Neck: No JVD present. Carotid bruit is not present. No edema present. No thyroid mass and no thyromegaly present.  Cardiovascular: S1 normal and S2 normal. Exam reveals no gallop.  No murmur heard. Pulses:      Dorsalis pedis pulses are 2+ on the right side and 2+ on the left side.  Respiratory: No respiratory distress. She has no wheezes. She has no rhonchi. She has no rales.  GI: Soft. Bowel sounds are normal. There is no abdominal tenderness.  Musculoskeletal:     Right shoulder: She exhibits no swelling.  Lymphadenopathy:    She has no cervical adenopathy.  Neurological: She is alert. No cranial nerve deficit.  Skin: Skin is warm. No rash noted. Nails show no clubbing.   Psychiatric: She has a normal mood and affect.      Data Reviewed: Basic Metabolic Panel: Recent Labs  Lab 02/05/18 1619 02/06/18 0342 02/07/18 0321  NA 138 137 138  K 3.6 4.0 3.5  CL 108 104 107  CO2 24 29 26   GLUCOSE 226* 178* 115*  BUN 27* 25* 17  CREATININE 1.37* 1.12* 0.81  CALCIUM 7.9* 9.0 8.5*   CBC: Recent Labs  Lab 02/05/18 1619 02/06/18 0342 02/07/18 0321  WBC 17.7* 13.4* 9.6  NEUTROABS 15.3*  --   --   HGB 12.2 12.2 10.4*  HCT 36.5 36.7 30.4*  MCV 97.6 96.6 97.1  PLT 276 272 203     Recent Results (from the past 240 hour(s))  Surgical pcr screen     Status: None   Collection Time: 02/05/18  8:17 PM  Result Value Ref Range Status   MRSA, PCR NEGATIVE NEGATIVE Final   Staphylococcus aureus NEGATIVE NEGATIVE Final    Comment: (NOTE) The Xpert SA Assay (FDA approved for NASAL specimens in patients 83 years of age and older), is one component of a comprehensive surveillance program. It is not intended to diagnose infection nor to guide or monitor treatment. Performed at [facility], [address] [place], [place] [address]   Urine Culture     Status: None   Collection Time: 02/06/18  2:10 PM  Result Value Ref Range Status   Specimen Description   Final    URINE, CATHETERIZED Performed at  [facility], [address] [place], [place] [address]    Special Requests   Final    NONE Performed at [facility], [address], [place], [place] [address]    Culture   Final    NO GROWTH Performed at [facility], [address], [place], [place] [address]    Report Status 02/07/2018 FINAL  Final     Studies: Dg Hip Operative Unilat W Or W/o Pelvis Left  Result Date: 02/06/2018 CLINICAL DATA:  LEFT hip surgery EXAM: OPERATIVE LEFT HIP (WITH PELVIS IF PERFORMED) 5 VIEWS TECHNIQUE: Fluoroscopic spot image(s) were submitted for interpretation post-operatively. COMPARISON:  02/05/2018 FLUOROSCOPY TIME:  0 minutes  36 seconds Images: 5 Dose: 1.41 mGy FINDINGS: Diffuse osseous demineralization. IM nail with dynamic screw placed at proximal LEFT femur across a reduced intertrochanteric fracture. No additional fracture or dislocation. IMPRESSION: Post ORIF of previously identified intertrochanteric fracture LEFT femur. Electronically Signed   By: Ulyses Southward M.D.   On: 02/06/2018 15:49   Dg Hip Unilat With Pelvis 2-3 Views Left  Result Date: 02/05/2018 CLINICAL DATA:  Fall. EXAM: DG HIP (WITH OR WITHOUT PELVIS) 2-3V LEFT COMPARISON:  None. FINDINGS: Frontal pelvis shows diffuse bony demineralization. SI joints and symphysis pubis unremarkable. No evidence for pubic ramus fracture. AP and cross-table lateral views of the left hip show an intertrochanteric left femoral neck fracture with varus angulation. IMPRESSION: Intertrochanteric left femoral neck fracture with varus angulation. Electronically Signed   By: Kennith Center M.D.   On: 02/05/2018 15:52    Scheduled Meds: . calcium-vitamin D  1 tablet Oral BID  . diltiazem  360 mg Oral Daily  . enoxaparin (LOVENOX) injection  30 mg Subcutaneous Q24H  . ferrous sulfate  325 mg Oral BID WC  . losartan  50 mg Oral Daily  . metoCLOPramide  10 mg Oral TID AC & HS  . multivitamin with minerals  1 tablet Oral Daily  . pantoprazole  40 mg Oral Daily  . senna-docusate  1 tablet Oral BID   Continuous Infusions: .  ceFAZolin (ANCEF) IV 2 g (02/07/18 1417)    Assessment/Plan:  1. left femoral neck fracture requiring operative repair.  Patient looks good today and assume potential out to rehab tomorrow. 2. Essential hypertension on Cozaar and diltiazem 3. GERD on PPI 4. Acute kidney injury.  Improved with IV fluids.  Stop IV fluids. 5. Impaired fasting glucose.  Patient not a diabetic at this time.  Code Status:     Code Status Orders  (From admission, onward)         Start     Ordered   02/05/18 1957  Do not attempt resuscitation (DNR)  Continuous     Question Answer Comment  In the event of cardiac or respiratory ARREST Do not call a "code blue"   In the event of cardiac or respiratory ARREST Do not perform Intubation, CPR, defibrillation or ACLS   In the event of cardiac or respiratory ARREST Use medication by any route, position, wound care, and other measures to relive pain and suffering. May use oxygen, suction and manual treatment of airway obstruction as needed for comfort.      02/05/18 1956        Code Status History    This patient has a current code status but no historical code status.     Family Communication: Spoke with son at the bedside Disposition Plan: Potentially out to rehab tomorrow.  Consultants:  Orthopedic surgery  Time spent: 28 minutes  Caylan Chenard [employer]  [employer]

## 2018-02-07 NOTE — Anesthesia Postprocedure Evaluation (Signed)
Anesthesia Post Note  Patient: Vanessa Brock  Procedure(s) Performed: INTRAMEDULLARY (IM) NAIL INTERTROCHANTRIC (Left Hip)  Patient location during evaluation: Nursing Unit Anesthesia Type: Spinal Level of consciousness: awake, awake and alert, oriented and patient cooperative Pain management: pain level controlled Vital Signs Assessment: post-procedure vital signs reviewed and stable Respiratory status: spontaneous breathing, nonlabored ventilation and respiratory function stable Cardiovascular status: stable Postop Assessment: no headache, no backache, adequate PO intake, no apparent nausea or vomiting and patient able to bend at knees Anesthetic complications: no     Last Vitals:  Vitals:   02/07/18 0521 02/07/18 0743  BP: (!) 138/53 (!) 160/67  Pulse: 90 89  Resp: 18 16  Temp: 36.9 C 36.8 C  SpO2: 97% 96%    Last Pain:  Vitals:   02/07/18 0745  TempSrc:   PainSc: 6                  Sharif Rendell,  Kenlei Safi R

## 2018-02-07 NOTE — Progress Notes (Signed)
PT is recommending SNF. Clinical Child psychotherapist (CSW) presented bed offers to patient and her son [name]. CSW discussed the quality measures of the facilities. Per [name] he would like to see if [facility] has a bed. Per [facility] admissions coordinator at [facility] they can't make a bed offer. Patient's 3 adult sons and patient chose [facility]. [facility] admissions coordinator at [facility] is aware of accepted bed offer and reported that patient can have a private room.   [employer], LCSW [phone]

## 2018-02-07 NOTE — Progress Notes (Signed)
Physical Therapy Treatment Patient Details Name: Vanessa Brock MRN: [id number] DOB: Mar 02, 1922 Today's Date: 02/07/2018    History of Present Illness 85 y/o female here s/p L hip fx with ORIF repair 02/06/18.    PT Comments    Pt very pleasant and eager to participate but struggled with hip and thigh pain and though she was able to walk farther than this AM she had more limping and breakdown of consistent cadence than this AM.  She displays weakness in thigh that seems related to pain/soreness more than pure strength.  She is slow and labored with most aspects of mobility but generally did not need a lot of assist (more tactile and verbal).  Pt doing well POD1 but continues to need close guarding and constant cuing especially with ambulation and in performing exercises appropriately (HEP issued and reviewed).   Follow Up Recommendations  SNF     Equipment Recommendations  Rolling walker with 5" wheels    Recommendations for Other Services       Precautions / Restrictions Precautions Precautions: Fall Restrictions LLE Weight Bearing: Weight bearing as tolerated    Mobility  Bed Mobility Overal bed mobility: Needs Assistance Bed Mobility: Supine to Sit     Supine to sit: Min guard     General bed mobility comments: Pt again needed light UE assist to get L LE toward EOB but she was able to rise to sitting EOB w/o physical assist  Transfers Overall transfer level: Needs assistance Equipment used: Rolling walker (2 wheeled) Transfers: Sit to/from Stand Sit to Stand: Min guard         General transfer comment: Pt unable to rise w/o assist once cued to place hand appropriate, unable to with b/l UEs on walker   Ambulation/Gait Ambulation/Gait assistance: Min assist Gait Distance (Feet): 75 Feet Assistive device: Rolling walker (2 wheeled)       General Gait Details: Pt highly motivated to do as much as she can but continues to self select slow, limping, R leg trailing  cadence. With cuing she was able to briefly correct this but would quickly revert to poor cadence w/o constant cuing.  She had fatigue with the effort and HR actual into the 140s with the effort.   Stairs             Wheelchair Mobility    Modified Rankin (Stroke Patients Only)       Balance Overall balance assessment: Needs assistance Sitting-balance support: Bilateral upper extremity supported Sitting balance-Leahy Scale: Good     Standing balance support: Bilateral upper extremity supported Standing balance-Leahy Scale: Fair Standing balance comment: again needed heavy use of UEs becuase of L hip pain and limited tolerance                            Cognition Arousal/Alertness: Awake/alert Behavior During Therapy: WFL for tasks assessed/performed Overall Cognitive Status: Within Functional Limits for tasks assessed                                        Exercises General Exercises - Lower Extremity Ankle Circles/Pumps: Strengthening;10 reps Quad Sets: Strengthening;10 reps Long Arc Quad: Strengthening;10 reps(unable to achieve full TKE against gravity) Heel Slides: 10 reps;AROM(with resisted leg extension) Hip ABduction/ADduction: Strengthening;10 reps Hip Flexion/Marching: AROM;Strengthening;10 reps    General Comments  Pertinent Vitals/Pain Pain Assessment: 0-10 Pain Score: 7  Pain Location: L hip and more down into thigh    Home Living                      Prior Function            PT Goals (current goals can now be found in the care plan section) Progress towards PT goals: Progressing toward goals    Frequency    BID      PT Plan Current plan remains appropriate    Co-evaluation              AM-PAC PT "6 Clicks" Mobility   Outcome Measure                   End of Session Equipment Utilized During Treatment: Gait belt Activity Tolerance: Patient limited by fatigue;Patient  limited by pain Patient left: with chair alarm set;with call bell/phone within reach;with family/visitor present Nurse Communication: Mobility status PT Visit Diagnosis: Muscle weakness (generalized) (M62.81);Difficulty in walking, not elsewhere classified (R26.2);Pain Pain - Right/Left: Left Pain - part of body: Hip     Time: 7169-6789 PT Time Calculation (min) (ACUTE ONLY): 27 min  Charges:  $Gait Training: 8-22 mins $Therapeutic Exercise: 8-22 mins                     Malachi Pro, DPT 02/07/2018, 4:42 PM

## 2018-02-07 NOTE — Evaluation (Signed)
Physical Therapy Evaluation Patient Details Name: Vanessa Brock MRN: [id number] DOB: 02-19-1922 Today's Date: 02/07/2018   History of Present Illness  85 y/o female here s/p L hip fx with ORIF repair 02/06/18.  Clinical Impression  Pt was able to do some limited bed exercises apart from the exam for ~10 minutes with most tasks with light AAROM. She was slow and guarded with ambulation but ultimately did not need excessive assist or have any overt LOBs.  She is pain limited with most activities and labored with mobility and ambulation but ultimately did relatively well with first time up post fx/sx. Pt will need STR as she would not be safe at home given her current functional level.     Follow Up Recommendations SNF    Equipment Recommendations  Rolling walker with 5" wheels(TBD at rehab)    Recommendations for Other Services       Precautions / Restrictions Precautions Precautions: Fall Restrictions Weight Bearing Restrictions: Yes LLE Weight Bearing: Weight bearing as tolerated      Mobility  Bed Mobility Overal bed mobility: Needs Assistance Bed Mobility: Supine to Sit     Supine to sit: Min guard     General bed mobility comments: Pt initially did not feel she would be able to get up, but with extra time, and cuing as well as using UEs to move L LE she was able to get to EOB w/o direct assist  Transfers Overall transfer level: Needs assistance Equipment used: Rolling walker (2 wheeled) Transfers: Sit to/from Stand Sit to Stand: Min assist         General transfer comment: Pt unable to rise on first attempt with b/l UEs on walker, cues to push from bed and light assist to keep weight forward getting to standing  Ambulation/Gait Ambulation/Gait assistance: Min assist Gait Distance (Feet): 25 Feet Assistive device: Rolling walker (2 wheeled)       General Gait Details: Pt with guarded and limping gait, highly reliant on the walker, but ultimately able to slowly get  nearly to the door before turning around and returning to recliner.  She did not show great confidence or consistent cadence, but did not have any LOBs or overt safety issues.  Stairs            Wheelchair Mobility    Modified Rankin (Stroke Patients Only)       Balance Overall balance assessment: Needs assistance Sitting-balance support: Bilateral upper extremity supported Sitting balance-Leahy Scale: Good     Standing balance support: Bilateral upper extremity supported Standing balance-Leahy Scale: Fair Standing balance comment: highly reliant on walker, poor weight shift tolerance onto L                             Pertinent Vitals/Pain Pain Assessment: 0-10 Pain Score: 6  Pain Location: L hip    Home Living Family/patient expects to be discharged to:: Skilled nursing facility Living Arrangements: Alone               Additional Comments: pt has 3 steps with rails, no AD    Prior Function Level of Independence: Independent         Comments: does not drive but is out of the home multiple times/weeks with dtr, no other falls in last 6 months, does not use AD     Hand Dominance        Extremity/Trunk Assessment   Upper Extremity Assessment Upper  Extremity Assessment: Generalized weakness;Overall WFL for tasks assessed    Lower Extremity Assessment Lower Extremity Assessment: Generalized weakness(expected L LE weakness post-op, R LE WFL)       Communication   Communication: No difficulties  Cognition Arousal/Alertness: Awake/alert Behavior During Therapy: WFL for tasks assessed/performed Overall Cognitive Status: Within Functional Limits for tasks assessed                                        General Comments      Exercises General Exercises - Lower Extremity Ankle Circles/Pumps: AROM;10 reps Quad Sets: Strengthening;10 reps Short Arc Quad: AROM;Strengthening;10 reps Heel Slides: AROM;10 reps(lightly  resisted leg extension) Hip ABduction/ADduction: AROM;AAROM;10 reps   Assessment/Plan    PT Assessment Patient needs continued PT services  PT Problem List Decreased strength;Decreased range of motion;Decreased activity tolerance;Decreased balance;Decreased mobility;Decreased coordination;Decreased knowledge of use of DME;Decreased safety awareness;Decreased knowledge of precautions;Pain       PT Treatment Interventions DME instruction;Gait training;Stair training;Functional mobility training;Therapeutic activities;Therapeutic exercise;Balance training;Neuromuscular re-education;Patient/family education    PT Goals (Current goals can be found in the Care Plan section)  Acute Rehab PT Goals Patient Stated Goal: get back to independent lifestyle PT Goal Formulation: With patient Time For Goal Achievement: 02/21/18 Potential to Achieve Goals: Good    Frequency BID   Barriers to discharge        Co-evaluation               AM-PAC PT "6 Clicks" Mobility  Outcome Measure Help needed turning from your back to your side while in a flat bed without using bedrails?: A Little Help needed moving from lying on your back to sitting on the side of a flat bed without using bedrails?: A Little Help needed moving to and from a bed to a chair (including a wheelchair)?: A Little Help needed standing up from a chair using your arms (e.g., wheelchair or bedside chair)?: A Little Help needed to walk in hospital room?: A Little Help needed climbing 3-5 steps with a railing? : A Lot 6 Click Score: 17    End of Session Equipment Utilized During Treatment: Gait belt Activity Tolerance: Patient limited by fatigue;Patient limited by pain Patient left: with chair alarm set;with call bell/phone within reach Nurse Communication: Mobility status PT Visit Diagnosis: Muscle weakness (generalized) (M62.81);Difficulty in walking, not elsewhere classified (R26.2);Pain Pain - Right/Left: Left Pain - part  of body: Hip    Time: 0927-0955 PT Time Calculation (min) (ACUTE ONLY): 28 min   Charges:   PT Evaluation $PT Eval Low Complexity: 1 Low PT Treatments $Therapeutic Exercise: 8-22 mins        Malachi ProGalen R Rejeana Fadness, DPT 02/07/2018, 10:46 AM

## 2018-02-07 NOTE — Evaluation (Signed)
Occupational Therapy Evaluation Patient Details Name: Vanessa Brock MRN: [id number] DOB: 12-05-1922 Today's Date: 02/07/2018    History of Present Illness 85 y/o female here s/p L hip fx with ORIF repair 02/06/18.   Clinical Impression   Pt seen for OT evaluation this date, POD#1 from above surgery after suffering a fall leading to a L hip fx. Pt was independent in all ADL prior to surgery and is eager to return to PLOF with less pain and improved safety and independence. Pt currently requires minimal assist for LB dressing and bathing while in seated position due to pain with hip flexion on the L. Pt/son instructed in self care skills, falls prevention strategies, home/routines modifications, DME/AE for LB bathing and dressing tasks, and compression stocking mgt strategies. Pt would benefit from additional instruction in self care skills and techniques to help maintain precautions with or without assistive devices to support recall and carryover prior to discharge. Recommend STR upon discharge.     Follow Up Recommendations  SNF    Equipment Recommendations  3 in 1 bedside commode;Other (comment)(reacher)    Recommendations for Other Services       Precautions / Restrictions Precautions Precautions: Fall Restrictions Weight Bearing Restrictions: Yes LLE Weight Bearing: Weight bearing as tolerated      Mobility Bed Mobility     General bed mobility comments: deferred, up in recliner at start and end of session  Transfers Overall transfer level: Needs assistance Equipment used: Rolling walker (2 wheeled) Transfers: Sit to/from Stand Sit to Stand: Min assist         General transfer comment: Pt unable to rise on first attempt with b/l UEs on walker, cues to push from bed and light assist to keep weight forward getting to standing    Balance Overall balance assessment: Needs assistance Sitting-balance support: Bilateral upper extremity supported Sitting balance-Leahy Scale:  Good     Standing balance support: Bilateral upper extremity supported Standing balance-Leahy Scale: Fair Standing balance comment: highly reliant on walker, poor weight shift tolerance onto L                           ADL either performed or assessed with clinical judgement   ADL Overall ADL's : Needs assistance/impaired                                       General ADL Comments: Pt requires Min A to initiate LB bathing/dressing 2/2 pain with hip flexion; Min A for functional mobility and transfers with RW     Vision Baseline Vision/History: Wears glasses Wears Glasses: At all times Patient Visual Report: No change from baseline       Perception     Praxis      Pertinent Vitals/Pain Pain Assessment: 0-10 Pain Score: 7  Pain Location: L hip Pain Descriptors / Indicators: Aching;Grimacing;Guarding Pain Intervention(s): Limited activity within patient's tolerance;Monitored during session;Repositioned     Hand Dominance Left   Extremity/Trunk Assessment Upper Extremity Assessment Upper Extremity Assessment: Generalized weakness(grossly 4/5)   Lower Extremity Assessment Lower Extremity Assessment: Generalized weakness(expected L LE weakness post-op, R LE WFL)   Cervical / Trunk Assessment Cervical / Trunk Assessment: Normal   Communication Communication Communication: No difficulties   Cognition Arousal/Alertness: Awake/alert Behavior During Therapy: WFL for tasks assessed/performed Overall Cognitive Status: Within Functional Limits for tasks assessed  General Comments       Exercises Other Exercises Other Exercises: pt/son instructed in AE/DME to improve safety and independence with ADL Other Exercises: pt/son instructed in falls prevention strategies and compression stocking mgt   Shoulder Instructions      Home Living Family/patient expects to be discharged to:: Skilled  nursing facility Living Arrangements: Alone                               Additional Comments: pt has 3 steps with rails, no AD      Prior Functioning/Environment Level of Independence: Independent        Comments: does not drive but is out of the home multiple times/weeks with dtr, no other falls in last 6 months, does not use AD ; pt does her own housework and light yardwork        OT Problem List: Decreased strength;Decreased knowledge of use of DME or AE;Decreased range of motion;Impaired balance (sitting and/or standing);Pain      OT Treatment/Interventions: Self-care/ADL training;Balance training;Therapeutic exercise;Therapeutic activities;DME and/or AE instruction;Patient/family education    OT Goals(Current goals can be found in the care plan section) Acute Rehab OT Goals Patient Stated Goal: get back to independent lifestyle OT Goal Formulation: With patient/family Time For Goal Achievement: 02/21/18 Potential to Achieve Goals: Good ADL Goals Pt Will Perform Lower Body Dressing: with min guard assist;sit to/from stand;with adaptive equipment Pt Will Transfer to Toilet: with supervision;ambulating(LRAD for amb) Additional ADL Goal #1: Pt will independently instruct family/caregiver in compression stocking mgt including donning/doffing, wear schedule, and positioning.  OT Frequency: Min 1X/week   Barriers to D/C:            Co-evaluation              AM-PAC OT "6 Clicks" Daily Activity     Outcome Measure Help from another person eating meals?: None Help from another person taking care of personal grooming?: A Little Help from another person toileting, which includes using toliet, bedpan, or urinal?: A Little Help from another person bathing (including washing, rinsing, drying)?: A Little Help from another person to put on and taking off regular upper body clothing?: A Little Help from another person to put on and taking off regular lower body  clothing?: A Little 6 Click Score: 19   End of Session Equipment Utilized During Treatment: Rolling walker;Gait belt  Activity Tolerance: Patient tolerated treatment well Patient left: in chair;with call bell/phone within reach;with chair alarm set;with family/visitor present;with SCD's reapplied  OT Visit Diagnosis: Other abnormalities of gait and mobility (R26.89);Muscle weakness (generalized) (M62.81);History of falling (Z91.81);Pain Pain - Right/Left: Left Pain - part of body: Hip                Time: [id number] OT Time Calculation (min): 22 min Charges:  OT General Charges $OT Visit: 1 Visit OT Evaluation $OT Eval Low Complexity: 1 Low OT Treatments $Self Care/Home Management : 8-22 mins  Richrd PrimeJamie Stiller, MPH, MS, OTR/L ascom [phone] 02/07/18, 12:33 PM

## 2018-02-07 NOTE — Consult Note (Signed)
ORTHOPAEDICS PROGRESS NOTE  PATIENT NAME: Vanessa Brock DOB: November 23, 1922  MRN: [id number]  POD # 1: ORIF left intertrochanteric femur fracture  Subjective: The patient rested well last night.  She states that pain has been minimal.  No nausea or vomiting. The patient tolerated sitting at the bedside with nursing last night.  Objective: Vital signs in last 24 hours: Temp:  [97.1 F (36.2 C)-100.1 F (37.8 C)] 98.5 F (36.9 C) (02/04 0521) Pulse Rate:  [55-94] 90 (02/04 0521) Resp:  [11-18] 18 (02/04 0521) BP: (98-161)/(39-71) 138/53 (02/04 0521) SpO2:  [94 %-99 %] 97 % (02/04 0521)  Intake/Output from previous day: 02/03 0701 - 02/04 0700 In: 2153.5 [I.V.:1688.8; IV Piggyback:464.7] Out: 2270 [Urine:2250; Blood:20]  Recent Labs    02/05/18 1619 02/06/18 0342 02/07/18 0321  WBC 17.7* 13.4* 9.6  HGB 12.2 12.2 10.4*  HCT 36.5 36.7 30.4*  PLT 276 272 203  K 3.6 4.0 3.5  CL 108 104 107  CO2 24 29 26   BUN 27* 25* 17  CREATININE 1.37* 1.12* 0.81  GLUCOSE 226* 178* 115*  CALCIUM 7.9* 9.0 8.5*  INR 1.05  --   --     EXAM General: Well-developed well nourished elderly female seen in no apparent discomfort. Lungs: clear to auscultation Cardiac: normal rate and regular rhythm Abdomen: Soft, nontender, nondistended.  Active bowel sounds are present. Left lower extremity: Hip dressings are clean and dry.  No significant swelling or ecchymosis to the thigh.  Homans test is negative. Neurologic: Awake, alert, and oriented.  Sensory and motor function are grossly intact.  Assessment: ORIF of left intertrochanteric femur fracture  Secondary diagnoses: Hypertension Gastroesophageal reflux disease  Plan: Today's goals were reviewed with the patient.  She is to begin physical therapy and Occupational Therapy as ordered. Plan is to go Skilled nursing facility after hospital stay. DVT Prophylaxis - Lovenox, TED hose and SCDs  Danely Bayliss P. Angie Fava M.D.

## 2018-02-08 ENCOUNTER — Other Ambulatory Visit: Payer: Self-pay

## 2018-02-08 ENCOUNTER — Inpatient Hospital Stay
Admit: 2018-02-08 | Discharge: 2018-02-08 | Disposition: A | Discharge status: Home / Self Care | Payer: Medicare Other | Attending: Internal Medicine | Admitting: Internal Medicine

## 2018-02-08 LAB — ECHOCARDIOGRAM COMPLETE
Height: 68 in
Weight: 1396.83 oz

## 2018-02-08 MED ORDER — MAGNESIUM SULFATE 2 GM/50ML IV SOLN
2.0000 g | Freq: Once | INTRAVENOUS | Status: AC
  Administered 2018-02-08: 2 g via INTRAVENOUS
  Filled 2018-02-08: qty 50

## 2018-02-08 MED ORDER — METOPROLOL TARTRATE 25 MG PO TABS
12.5000 mg | ORAL_TABLET | Freq: Two times a day (BID) | ORAL | Status: DC
  Administered 2018-02-08 – 2018-02-09 (×3): 12.5 mg via ORAL
  Filled 2018-02-08 (×3): qty 1

## 2018-02-08 MED ORDER — PRO-STAT SUGAR FREE PO LIQD
30.0000 mL | Freq: Two times a day (BID) | ORAL | Status: DC
  Administered 2018-02-08 – 2018-02-09 (×2): 30 mL via ORAL

## 2018-02-08 MED ORDER — APIXABAN 2.5 MG PO TABS
2.5000 mg | ORAL_TABLET | Freq: Two times a day (BID) | ORAL | Status: DC
  Administered 2018-02-08 – 2018-02-09 (×3): 2.5 mg via ORAL
  Filled 2018-02-08 (×3): qty 1

## 2018-02-08 MED ORDER — METOPROLOL TARTRATE 5 MG/5ML IV SOLN: 5.0000 mg | INTRAVENOUS | Status: DC | PRN

## 2018-02-08 NOTE — Care Management Important Message (Signed)
Important Message  Patient Details  Name: Vanessa Brock MRN: [id number] Date of Birth: Jun 25, 1922   Medicare Important Message Given:  Yes    [name] A Mike Hamre 02/08/2018, 12:02 PM

## 2018-02-08 NOTE — Progress Notes (Signed)
Patient ID: Vanessa Brock, female   DOB: 05/24/1922, 85 y.o.   MRN: [id number]  [facility] PROGRESS NOTE  Vanessa Brock:[id number] DOB: [birth year] DOA: 02/05/2018 PCP: Patrice Paradise, MD  HPI/Subjective: Patient feels okay.  Offers no complaints.  Had a bowel movement already.  Some pain in the hip.  Objective: Vitals:   02/08/18 1308 02/08/18 1707  BP:  129/61  Pulse: 89 82  Resp:  16  Temp:  98.5 F (36.9 C)  SpO2:  95%    Filed Weights   02/05/18 2004  Weight: 39.6 kg    ROS: Review of Systems  Constitutional: Negative for chills and fever.  Eyes: Negative for blurred vision.  Respiratory: Negative for cough and shortness of breath.   Cardiovascular: Negative for chest pain.  Gastrointestinal: Negative for abdominal pain, constipation, diarrhea, nausea and vomiting.  Genitourinary: Negative for dysuria.  Musculoskeletal: Positive for joint pain.  Neurological: Negative for dizziness and headaches.   Exam: Physical Exam  HENT:  Nose: No mucosal edema.  Mouth/Throat: No oropharyngeal exudate or posterior oropharyngeal edema.  Eyes: Pupils are equal, round, and reactive to light. Conjunctivae, EOM and lids are normal.  Neck: No JVD present. Carotid bruit is not present. No edema present. No thyroid mass and no thyromegaly present.  Cardiovascular: S1 normal, S2 normal and normal heart sounds. An irregularly irregular rhythm present. Tachycardia present. Exam reveals no gallop.  No murmur heard. Pulses:      Dorsalis pedis pulses are 2+ on the right side and 2+ on the left side.  Respiratory: No respiratory distress. She has no wheezes. She has no rhonchi. She has no rales.  GI: Soft. Bowel sounds are normal. There is no abdominal tenderness.  Musculoskeletal:     Right shoulder: She exhibits no swelling.  Lymphadenopathy:    She has no cervical adenopathy.  Neurological: She is alert. No cranial nerve deficit.  Skin: Skin is warm. No rash noted. Nails show  no clubbing.  Psychiatric: She has a normal mood and affect.      Data Reviewed: Basic Metabolic Panel: Recent Labs  Lab 02/05/18 1619 02/06/18 0342 02/07/18 0321  NA 138 137 138  K 3.6 4.0 3.5  CL 108 104 107  CO2 24 29 26   GLUCOSE 226* 178* 115*  BUN 27* 25* 17  CREATININE 1.37* 1.12* 0.81  CALCIUM 7.9* 9.0 8.5*   CBC: Recent Labs  Lab 02/05/18 1619 02/06/18 0342 02/07/18 0321  WBC 17.7* 13.4* 9.6  NEUTROABS 15.3*  --   --   HGB 12.2 12.2 10.4*  HCT 36.5 36.7 30.4*  MCV 97.6 96.6 97.1  PLT 276 272 203     Recent Results (from the past 240 hour(s))  Surgical pcr screen     Status: None   Collection Time: 02/05/18  8:17 PM  Result Value Ref Range Status   MRSA, PCR NEGATIVE NEGATIVE Final   Staphylococcus aureus NEGATIVE NEGATIVE Final    Comment: (NOTE) The Xpert SA Assay (FDA approved for NASAL specimens in patients 32 years of age and older), is one component of a comprehensive surveillance program. It is not intended to diagnose infection nor to guide or monitor treatment. Performed at [facility], [address] [place], [place] [address]   Urine Culture     Status: None   Collection Time: 02/06/18  2:10 PM  Result Value Ref Range Status   Specimen Description   Final    URINE, CATHETERIZED Performed at  [facility], [address] [place], [place] [address]    Special Requests   Final    NONE Performed at [facility], [address], [place], [place] [address]    Culture   Final    NO GROWTH Performed at [facility], [address], [place], [place] [address]    Report Status 02/07/2018 FINAL  Final      Scheduled Meds: . apixaban  2.5 mg Oral BID  . calcium-vitamin D  1 tablet Oral BID  . diltiazem  360 mg Oral Daily  . feeding supplement (PRO-STAT SUGAR FREE 64)  30 mL Oral BID  . ferrous sulfate  325 mg Oral BID WC  . losartan  50 mg Oral Daily  . metoCLOPramide  10 mg Oral TID AC & HS  .  metoprolol tartrate  12.5 mg Oral BID  . multivitamin with minerals  1 tablet Oral Daily  . pantoprazole  40 mg Oral Daily  . senna-docusate  1 tablet Oral BID   Continuous Infusions:   Assessment/Plan:  1. Atrial fibrillation with rapid ventricular response.  Patient already on Cardizem CD.  Added low-dose metoprolol.  Put on telemetry monitoring.  Ordered an echocardiogram.  TSH ordered for tomorrow morning.  Started on low-dose Eliquis.   2. Left femoral neck fracture requiring operative repair.  Doing well postoperatively. 3. Essential hypertension on Cozaar and diltiazem.  Added metoprolol low dose secondary to atrial fibrillation. 4. GERD on PPI 5. Acute kidney injury.  Improved with IV fluids.  Stop IV fluids. 6. Impaired fasting glucose.  Patient not a diabetic at this time.  Code Status:     Code Status Orders  (From admission, onward)         Start     Ordered   02/05/18 1957  Do not attempt resuscitation (DNR)  Continuous    Question Answer Comment  In the event of cardiac or respiratory ARREST Do not call a "code blue"   In the event of cardiac or respiratory ARREST Do not perform Intubation, CPR, defibrillation or ACLS   In the event of cardiac or respiratory ARREST Use medication by any route, position, wound care, and other measures to relive pain and suffering. May use oxygen, suction and manual treatment of airway obstruction as needed for comfort.      02/05/18 1956        Code Status History    This patient has a current code status but no historical code status.     Family Communication: Spoke to son's daughter and daughter-in-law at the bedside Disposition Plan: With atrial fibrillation today I did not want to send out of the hospital yet.  Likely disposition to rehab tomorrow.  Consultants:  Orthopedic surgery  Time spent: 29 minutes  Armari Fussell [employer]

## 2018-02-08 NOTE — Progress Notes (Signed)
Occupational Therapy Treatment Patient Details Name: Vanessa Brock MRN: [id number] DOB: 07/16/1922 Today's Date: 02/08/2018    History of present illness 85 y/o female here s/p L hip fx with ORIF repair 02/06/18.   OT comments  Pt tolerated treatment well this date. Still complaining of pretty significant (6/10 pain with movement) of L hip. She states she has always been independent and is motivated to stay that way so she is highly receptive to education re: LB dressing AE. OT facilitates visual demonstration as well as provides written information on each peace of equipment, the purpose and potential places to find said equipment. Pt and son who is preset at time of treatment are very receptive to education. Pt is assisted to EOB with OT reducing friction of L LE against sheets for sup to sit to R side of bed. On return demonstration of equipment use while seated EOB, pt does struggle a little bit to doff sock with reacher and don with sock aid requiring MIN verbal/tactile cues as well as MIN assist, but overall, demonstrates 80% understanding of task. Pt is returned to supine with MIN A to manage L LE. And is scooted up in bed with MOD A and pt use of bed rails to pull. Pt is left supine with family present and bed alarm on.    Follow Up Recommendations  SNF    Equipment Recommendations  3 in 1 bedside commode;Other (comment)    Recommendations for Other Services      Precautions / Restrictions Precautions Precautions: Fall Restrictions Weight Bearing Restrictions: No LLE Weight Bearing: Weight bearing as tolerated       Mobility Bed Mobility Overal bed mobility: Needs Assistance Bed Mobility: Supine to Sit     Supine to sit: Min assist Sit to supine: Min assist   General bed mobility comments: Pt requires assist to move L LE toward R EOB as well as to elevate back to bed level after sitting.   Transfers             Balance Overall balance assessment: Needs  assistance Sitting-balance support: Bilateral upper extremity supported;Feet supported Sitting balance-Leahy Scale: Good                               ADL either performed or assessed with clinical judgement   ADL Overall ADL's : Needs assistance/impaired                    Lower Body Dressing: Set up;Minimal assistance;With adaptive equipment;Sitting/lateral leans Lower Body Dressing Details (indicate cue type and reason): sitting EOB, pt participates in AE practice/training for LB dressing. She still requires MIN A to remove sock with reacher and don with sock aid.                     Vision Baseline Vision/History: Wears glasses Wears Glasses: At all times Patient Visual Report: No change from baseline     Perception     Praxis      Cognition Arousal/Alertness: Awake/alert Behavior During Therapy: WFL for tasks assessed/performed Overall Cognitive Status: Within Functional Limits for tasks assessed                                          Exercises Other Exercises: Pt and son who is present during tx  educated on components of hip kit, places to find the supplies as well as the steps to utilize the equipment and proper technique. Pt demonstrates 80% carryover, pt and son verbalize 100% understanding.    Shoulder Instructions       General Comments      Pertinent Vitals/ Pain       Pain Assessment: 0-10 Pain Score: 6  Pain Location: L hip and more down into thigh Pain Descriptors / Indicators: Aching;Grimacing Pain Intervention(s): Monitored during session;Limited activity within patient's tolerance  Home Living                                          Prior Functioning/Environment              Frequency  Min 1X/week        Progress Toward Goals  OT Goals(current goals can now be found in the care plan section)  Progress towards OT goals: Progressing toward goals  Acute Rehab OT  Goals Patient Stated Goal: get back to independent lifestyle OT Goal Formulation: With patient/family Time For Goal Achievement: 02/21/18 Potential to Achieve Goals: Good  Plan Discharge plan remains appropriate    Co-evaluation                 AM-PAC OT "6 Clicks" Daily Activity     Outcome Measure   Help from another person eating meals?: None Help from another person taking care of personal grooming?: A Little Help from another person toileting, which includes using toliet, bedpan, or urinal?: A Little Help from another person bathing (including washing, rinsing, drying)?: A Little Help from another person to put on and taking off regular upper body clothing?: A Little Help from another person to put on and taking off regular lower body clothing?: A Little 6 Click Score: 19    End of Session    OT Visit Diagnosis: Other abnormalities of gait and mobility (R26.89);Muscle weakness (generalized) (M62.81);History of falling (Z91.81);Pain Pain - Right/Left: Left Pain - part of body: Hip   Activity Tolerance Patient tolerated treatment well   Patient Left in bed;with call bell/phone within reach;with bed alarm set;with family/visitor present   Nurse Communication          Time: 0355-9741 OT Time Calculation (min): 36 min  Charges: OT General Charges $OT Visit: 1 Visit OT Treatments $Self Care/Home Management : 23-37 mins  Gerrianne Scale, MS, OTR/L ascom [phone] or [phone] 02/08/18, 4:43 PM

## 2018-02-08 NOTE — Progress Notes (Signed)
Physical Therapy Treatment Patient Details Name: Vanessa Brock MRN: [id number] DOB: 1922-08-09 Today's Date: 02/08/2018    History of Present Illness 85 y/o female here s/p L hip fx with ORIF repair 02/06/18.    PT Comments    Patient received in bed, agreeable to PT session. Performed supine LE strengthening exercises prior to ambulation requiring assist to complete properly. Patient requires min guard for bed mobility and transfers with cues for safety. Patient then ambulated 80 feet with step to pattern using rw. Antalgic due to left LE pain, decreased step length bilaterally. Patient will benefit from continued PT to improve functional independence, strength and safety.       Follow Up Recommendations  SNF     Equipment Recommendations  Rolling walker with 5" wheels    Recommendations for Other Services       Precautions / Restrictions Precautions Precautions: Fall Restrictions Weight Bearing Restrictions: No LLE Weight Bearing: Weight bearing as tolerated    Mobility  Bed Mobility Overal bed mobility: Needs Assistance Bed Mobility: Supine to Sit;Sit to Supine     Supine to sit: Min guard Sit to supine: Min guard   General bed mobility comments: patient required max +2 to scoot up to head of bed in supine  Transfers Overall transfer level: Needs assistance Equipment used: Rolling walker (2 wheeled) Transfers: Sit to/from Stand Sit to Stand: Min guard         General transfer comment: Patient requires cues for hand placement for effective transfer  Ambulation/Gait Ambulation/Gait assistance: Min guard Gait Distance (Feet): 80 Feet Assistive device: Rolling walker (2 wheeled) Gait Pattern/deviations: Step-to pattern;Decreased step length - left;Decreased step length - right;Decreased stance time - left;Antalgic;Narrow base of support Gait velocity: decreased       Stairs             Wheelchair Mobility    Modified Rankin (Stroke Patients Only)        Balance Overall balance assessment: Needs assistance;Mild deficits observed, not formally tested Sitting-balance support: Bilateral upper extremity supported Sitting balance-Leahy Scale: Good     Standing balance support: Bilateral upper extremity supported Standing balance-Leahy Scale: Fair Standing balance comment: heavy use of UEs becuase of L hip pain and limited tolerance                            Cognition Arousal/Alertness: Awake/alert Behavior During Therapy: WFL for tasks assessed/performed Overall Cognitive Status: Within Functional Limits for tasks assessed                                        Exercises Total Joint Exercises Ankle Circles/Pumps: AROM;10 reps;Both;Supine Quad Sets: 10 reps;Left;AROM;Supine Gluteal Sets: AROM;Left;10 reps;Supine Heel Slides: AAROM;10 reps;Left;Supine Hip ABduction/ADduction: AAROM;10 reps;Left;Supine    General Comments        Pertinent Vitals/Pain Pain Assessment: No/denies pain Pain Score: 7  Pain Location: L hip and more down into thigh Pain Descriptors / Indicators: Aching;Discomfort;Grimacing;Guarding;Operative site guarding Pain Intervention(s): Limited activity within patient's tolerance;Monitored during session;Repositioned    Home Living                      Prior Function            PT Goals (current goals can now be found in the care plan section) Acute Rehab PT Goals Patient Stated Goal:  get back to independent lifestyle PT Goal Formulation: With patient Time For Goal Achievement: 02/21/18 Potential to Achieve Goals: Good Progress towards PT goals: Progressing toward goals    Frequency    BID      PT Plan Current plan remains appropriate    Co-evaluation              AM-PAC PT "6 Clicks" Mobility   Outcome Measure  Help needed turning from your back to your side while in a flat bed without using bedrails?: A Little Help needed moving from lying  on your back to sitting on the side of a flat bed without using bedrails?: A Little Help needed moving to and from a bed to a chair (including a wheelchair)?: A Little Help needed standing up from a chair using your arms (e.g., wheelchair or bedside chair)?: A Little Help needed to walk in hospital room?: A Little Help needed climbing 3-5 steps with a railing? : A Lot 6 Click Score: 17    End of Session Equipment Utilized During Treatment: Gait belt Activity Tolerance: Patient tolerated treatment well;Patient limited by fatigue;Patient limited by pain Patient left: with call bell/phone within reach;in bed(patient having [place] of heart after PT so returned to bed. ) Nurse Communication: Mobility status PT Visit Diagnosis: Muscle weakness (generalized) (M62.81);Difficulty in walking, not elsewhere classified (R26.2);Pain Pain - Right/Left: Left Pain - part of body: Hip     Time: 9528-4132 PT Time Calculation (min) (ACUTE ONLY): 18 min  Charges:  $Gait Training: 8-22 mins                     Mayukha Symmonds, PT, GCS 02/08/18,10:46 AM

## 2018-02-08 NOTE — Progress Notes (Signed)
ANTICOAGULATION CONSULT NOTE - Initial Consult  Pharmacy Consult for apixaban Indication: AF  Allergies  Allergen Reactions  . Aspirin Nausea And Vomiting  . Sulfur Hives    Patient Measurements: Height: 5\' 8"  (172.7 cm) Weight: 87 lb 4.8 oz (39.6 kg) IBW/kg (Calculated) : 63.9 Heparin Dosing Weight:   Vital Signs: Temp: 98.2 F (36.8 C) (02/05 0753) Temp Source: Oral (02/05 0753) BP: 142/76 (02/05 0753) Pulse Rate: 72 (02/05 0753)  Labs: Recent Labs    02/05/18 1619 02/06/18 0342 02/07/18 0321  HGB 12.2 12.2 10.4*  HCT 36.5 36.7 30.4*  PLT 276 272 203  LABPROT 13.6  --   --   INR 1.05  --   --   CREATININE 1.37* 1.12* 0.81    Estimated Creatinine Clearance: 27.1 mL/min (by C-G formula based on SCr of 0.81 mg/dL).   Medical History: Past Medical History:  Diagnosis Date  . GERD (gastroesophageal reflux disease)   . Hypertension     Medications:  Infusions:    Assessment: 89 yof cc hip fracture after mechanical fall. Patient noted to have post op AF and pharmacy consulted to dose apixaban for AF. No LMWH administered so far this morning. CHA2DS2/VAS score is at least 3 (age >100, HTN). No PTA OAC noted.    Goal of Therapy:  Monitor platelets by anticoagulation protocol: Yes   Plan:  Apixaban 2.5 mg po BID. CBC and SCr Q3D while IP per protocol.   Carola Frost, PharmD, BCPS Clinical Pharmacist 02/08/2018,11:11 AM

## 2018-02-08 NOTE — Progress Notes (Signed)
*  PRELIMINARY RESULTS* Echocardiogram 2D Echocardiogram has been performed.  Cristela BlueHege, Zelpha Messing 02/08/2018, 10:46 AM

## 2018-02-08 NOTE — Progress Notes (Signed)
Initial Nutrition Assessment  DOCUMENTATION CODES:   Underweight  INTERVENTION:  Pro-stat BID, each supplement provides 100 kcals and 15 grams protein MVI Magic cup TID with meals, each supplement provides 290 kcal and 9 grams of protein  NUTRITION DIAGNOSIS:   Increased nutrient needs related to hip fracture, acute illness as evidenced by estimated needs.   GOAL:   Patient will meet greater than or equal to 90% of their needs  MONITOR:   PO intake, Weight trends, Supplement acceptance  REASON FOR ASSESSMENT:   Other (Comment)(low BMI)    ASSESSMENT:  85 year old female admitted with left hip fracture after mechanical fall with ORIF repair on 2/3. Past medical history includes HTN, GERD, abdominal hysterectomy, cholecystectomy  Patient experienced post op AF and will d/c to [facility] when medically stable.   Patient with son and family friend in room at visit. Patient reports good PO during stay, son elaborating that she has eaten more in the hospital than at home. Patient stated that she does not eat a whole lot and never has been a big eater and likes to drink 1 ensure/day. She reports 85lbs as her UBW.   RD encouraged continued PO intake during stay and talked about the importance of calories and protein for healing and energy for rehab. Patient agreeable to Pro-stat and [employer], but denied Ensure stating that she has her own.   Medications reviewed and include: Ca-vit D, ferrous sulfate, Reglan, MVI, Protonix, Oxycodone, Tramadol  Labs: non pertinent  2/5: ECHO NUTRITION - FOCUSED PHYSICAL EXAM:    Most Recent Value  Orbital Region  Mild depletion  Upper Arm Region  Mild depletion  Thoracic and Lumbar Region  Moderate depletion  Buccal Region  Mild depletion  Temple Region  Mild depletion  Clavicle Bone Region  Moderate depletion  Clavicle and Acromion Bone Region  Mild depletion  Scapular Bone Region  Mild depletion  Dorsal Hand  Mild depletion  Patellar  Region  Unable to assess  Anterior Thigh Region  Unable to assess  Posterior Calf Region  Unable to assess  Edema (RD Assessment)  None  Hair  Reviewed  Eyes  Reviewed  Mouth  Reviewed  Skin  Reviewed  Nails  Reviewed       Diet Order:   Diet Order            Diet regular Room service appropriate? Yes; Fluid consistency: Thin  Diet effective now              EDUCATION NEEDS:   No education needs have been identified at this time  Skin:  Skin Assessment: Reviewed RN Assessment(incision (closed); left hip)  Last BM:  2/5: Type 7, Green; Med  Height:   Ht Readings from Last 1 Encounters:  02/05/18 5\' 8"  (1.727 m)    Weight:   Wt Readings from Last 1 Encounters:  02/05/18 39.6 kg    Ideal Body Weight:  63.6 kg  BMI:  Body mass index is 13.27 kg/m.  Estimated Nutritional Needs:   Kcal:  4403-4742  Protein:  52-64 grams  Fluid:  1.2L/day    Lars Masson, RD, LDN  After Hours/Weekend Pager: [phone]

## 2018-02-08 NOTE — Progress Notes (Signed)
Physical Therapy Treatment Patient Details Name: Vanessa Brock MRN: [id number] DOB: [birth year] Today's Date: 02/08/2018    History of Present Illness 85 y/o female here s/p L hip fx with ORIF repair 02/06/18.    PT Comments    Patient received in bed, family present. Patient agreeable to PT. Reports " I already did this once today." Patient requiring min assist for bed mobility, min guard with cues for sit>< stand transfers, min guard for ambulation with RW 75 feet. Patient limited with ambulation distance due to pain. Patient will continue to benefit from skilled PT to address her functional limitations, pain and difficulty walking.    Follow Up Recommendations  SNF     Equipment Recommendations  Rolling walker with 5" wheels    Recommendations for Other Services       Precautions / Restrictions Precautions Precautions: Fall Restrictions Weight Bearing Restrictions: No LLE Weight Bearing: Weight bearing as tolerated    Mobility  Bed Mobility Overal bed mobility: Needs Assistance Bed Mobility: Supine to Sit     Supine to sit: Min assist Sit to supine: Min guard   General bed mobility comments: patient required max +2 to scoot up to head of bed in supine  Transfers Overall transfer level: Needs assistance Equipment used: Rolling walker (2 wheeled) Transfers: Sit to/from Stand Sit to Stand: Min guard         General transfer comment: Patient requires cues for hand placement for effective transfer  Ambulation/Gait Ambulation/Gait assistance: Min guard Gait Distance (Feet): 75 Feet Assistive device: Rolling walker (2 wheeled) Gait Pattern/deviations: Decreased step length - left;Decreased step length - right;Step-to pattern;Decreased weight shift to left;Antalgic Gait velocity: decreased       Stairs             Wheelchair Mobility    Modified Rankin (Stroke Patients Only)       Balance Overall balance assessment: Needs assistance;Mild deficits  observed, not formally tested Sitting-balance support: Bilateral upper extremity supported;Feet supported Sitting balance-Leahy Scale: Good     Standing balance support: Bilateral upper extremity supported Standing balance-Leahy Scale: Fair Standing balance comment: heavy use of UEs becuase of L hip pain and limited tolerance                            Cognition Arousal/Alertness: Awake/alert Behavior During Therapy: WFL for tasks assessed/performed Overall Cognitive Status: Within Functional Limits for tasks assessed                                        Exercises Total Joint Exercises Ankle Circles/Pumps: AROM;10 reps;Both;Supine Quad Sets: AROM;10 reps;Both;Supine Gluteal Sets: AROM;10 reps;Supine;Both Heel Slides: 10 reps;Supine;Left;AAROM Hip ABduction/ADduction: AAROM;Left;Supine;10 reps    General Comments        Pertinent Vitals/Pain Pain Assessment: 0-10 Pain Score: 8  Pain Location: L hip and more down into thigh Pain Descriptors / Indicators: Aching;Discomfort;Grimacing;Guarding Pain Intervention(s): Limited activity within patient's tolerance;Premedicated before session;Monitored during session;Repositioned    Home Living                      Prior Function            PT Goals (current goals can now be found in the care plan section) Acute Rehab PT Goals Patient Stated Goal: get back to independent lifestyle PT Goal Formulation: With patient  Time For Goal Achievement: 02/21/18 Potential to Achieve Goals: Good Progress towards PT goals: Progressing toward goals    Frequency    BID      PT Plan Current plan remains appropriate    Co-evaluation              AM-PAC PT "6 Clicks" Mobility   Outcome Measure  Help needed turning from your back to your side while in a flat bed without using bedrails?: A Little Help needed moving from lying on your back to sitting on the side of a flat bed without using  bedrails?: A Little Help needed moving to and from a bed to a chair (including a wheelchair)?: A Little Help needed standing up from a chair using your arms (e.g., wheelchair or bedside chair)?: A Little Help needed to walk in hospital room?: A Little Help needed climbing 3-5 steps with a railing? : A Lot 6 Click Score: 17    End of Session Equipment Utilized During Treatment: Gait belt Activity Tolerance: Patient tolerated treatment well;Patient limited by pain Patient left: in chair;with call bell/phone within reach;with chair alarm set Nurse Communication: Mobility status;Patient requests pain meds PT Visit Diagnosis: Muscle weakness (generalized) (M62.81);Difficulty in walking, not elsewhere classified (R26.2);Pain;Unsteadiness on feet (R26.81) Pain - Right/Left: Left Pain - part of body: Hip     Time: 1245-1310 PT Time Calculation (min) (ACUTE ONLY): 25 min  Charges:  $Gait Training: 8-22 mins $Therapeutic Exercise: 8-22 mins                     Sherlock Nancarrow, PT, GCS 02/08/18,1:47 PM

## 2018-02-08 NOTE — Progress Notes (Addendum)
   Subjective: 2 Days Post-Op Procedure(s) (LRB): INTRAMEDULLARY (IM) NAIL INTERTROCHANTRIC (Left) Patient reports pain as mild.   Patient is well, and has had no acute complaints or problems Patient did very well with therapy yesterday.  Was able to ambulate 75 feet Plan is to go Rehab after hospital stay. no nausea and no vomiting Patient denies any chest pains or shortness of breath. Objective: Vital signs in last 24 hours: Temp:  [97.8 F (36.6 C)-98 F (36.7 C)] 97.9 F (36.6 C) (02/04 2258) Pulse Rate:  [63-91] 88 (02/04 2258) Resp:  [18-19] 18 (02/04 1601) BP: (146-165)/(57-64) 163/60 (02/04 2258) SpO2:  [94 %-96 %] 95 % (02/04 2258) well approximated incision Heels are non tender and elevated off the bed using rolled towels Intake/Output from previous day: 02/04 0701 - 02/05 0700 In: 695.1 [P.O.:360; IV Piggyback:335.1] Out: -  Intake/Output this shift: No intake/output data recorded.  Recent Labs    02/05/18 1619 02/06/18 0342 02/07/18 0321  HGB 12.2 12.2 10.4*   Recent Labs    02/06/18 0342 02/07/18 0321  WBC 13.4* 9.6  RBC 3.80* 3.13*  HCT 36.7 30.4*  PLT 272 203   Recent Labs    02/06/18 0342 02/07/18 0321  NA 137 138  K 4.0 3.5  CL 104 107  CO2 29 26  BUN 25* 17  CREATININE 1.12* 0.81  GLUCOSE 178* 115*  CALCIUM 9.0 8.5*   Recent Labs    02/05/18 1619  INR 1.05    EXAM General - Patient is Alert, Appropriate and Oriented Extremity - Neurologically intact Neurovascular intact Sensation intact distally Intact pulses distally Dorsiflexion/Plantar flexion intact No cellulitis present Compartment soft Dressing - dressing C/D/I Motor Function - intact, moving foot and toes well on exam.    Past Medical History:  Diagnosis Date  . GERD (gastroesophageal reflux disease)   . Hypertension     Assessment/Plan: 2 Days Post-Op Procedure(s) (LRB): INTRAMEDULLARY (IM) NAIL INTERTROCHANTRIC (Left) Active Problems:   Hip fracture  (HCC)  Estimated body mass index is 13.27 kg/m as calculated from the following:   Height as of this encounter: 5\' 8"  (1.727 m).   Weight as of this encounter: 39.6 kg. Up with therapy Discharge to SNF  Labs: None DVT Prophylaxis - Lovenox, Foot Pumps and TED hose Weight-Bearing as tolerated to left leg Patient will need to follow-up in our clinic in 2 weeks. Continue Lovenox 2 weeks 30 mg twice daily.  If patient goes home before 2 weeks may change this to 40 mg daily for the remainder of the time. No shower until the staples are removed Continue TED stockings  Paulette Lynch R. [facility] PA [facility] 02/08/2018, 7:52 AM

## 2018-02-08 NOTE — Progress Notes (Signed)
Per MD patient is not stable for D/C today. Plan is for patient to D/C to [facility] when medically stable. [facility] admissions coordinator at [facility] is aware of above.   [employer], LCSW [phone]

## 2018-02-09 LAB — CBC
HCT: 29.5 % — ABNORMAL LOW (ref 36.0–46.0)
Hemoglobin: 9.6 g/dL — ABNORMAL LOW (ref 12.0–15.0)
MCH: 31.8 pg (ref 26.0–34.0)
MCHC: 32.5 g/dL (ref 30.0–36.0)
MCV: 97.7 fL (ref 80.0–100.0)
Platelets: 220 10*3/uL (ref 150–400)
RBC: 3.02 MIL/uL — ABNORMAL LOW (ref 3.87–5.11)
RDW: 13.1 % (ref 11.5–15.5)
WBC: 11.9 10*3/uL — ABNORMAL HIGH (ref 4.0–10.5)
nRBC: 0 % (ref 0.0–0.2)

## 2018-02-09 LAB — TSH: TSH: 0.709 u[IU]/mL (ref 0.350–4.500)

## 2018-02-09 LAB — BASIC METABOLIC PANEL
Anion gap: 4 — ABNORMAL LOW (ref 5–15)
BUN: 24 mg/dL — ABNORMAL HIGH (ref 8–23)
CO2: 30 mmol/L (ref 22–32)
Calcium: 8.8 mg/dL — ABNORMAL LOW (ref 8.9–10.3)
Chloride: 104 mmol/L (ref 98–111)
Creatinine, Ser: 0.96 mg/dL (ref 0.44–1.00)
GFR calc Af Amer: 59 mL/min — ABNORMAL LOW (ref >60)
GFR calc non Af Amer: 51 mL/min — ABNORMAL LOW (ref >60)
Glucose, Bld: 111 mg/dL — ABNORMAL HIGH (ref 70–99)
Potassium: 3.6 mmol/L (ref 3.5–5.1)
Sodium: 138 mmol/L (ref 135–145)

## 2018-02-09 MED ORDER — TRAMADOL HCL 50 MG PO TABS: 50.0000 mg | ORAL_TABLET | ORAL | 1 refills | Status: DC | PRN

## 2018-02-09 MED ORDER — METOPROLOL TARTRATE 25 MG PO TABS: 12.5000 mg | ORAL_TABLET | Freq: Two times a day (BID) | ORAL | Status: DC

## 2018-02-09 MED ORDER — OXYCODONE HCL 5 MG PO TABS: 5.0000 mg | ORAL_TABLET | ORAL | 0 refills | Status: DC | PRN

## 2018-02-09 MED ORDER — APIXABAN 2.5 MG PO TABS: 2.5000 mg | ORAL_TABLET | Freq: Two times a day (BID) | ORAL | 0 refills | Status: DC

## 2018-02-09 NOTE — Discharge Instructions (Signed)
INSTRUCTIONS AFTER Surgery  o Remove items at home which could result in a fall. This includes throw rugs or furniture in walking pathways o ICE to the affected joint every three hours while awake for 30 minutes at a time, for at least the first 3-5 days, and then as needed for pain and swelling.  Continue to use ice for pain and swelling. You may notice swelling that will progress down to the foot and ankle.  This is normal after surgery.  Elevate your leg when you are not up walking on it.   o Continue to use the breathing machine you got in the hospital (incentive spirometer) which will help keep your temperature down.  It is common for your temperature to cycle up and down following surgery, especially at night when you are not up moving around and exerting yourself.  The breathing machine keeps your lungs expanded and your temperature down.   DIET:  As you were doing prior to hospitalization, we recommend a well-balanced diet.  DRESSING / WOUND CARE / SHOWERING  Dressing is waterproof.  No showering.  Change as needed.  Staples will be removed in 2 weeks with application of Steri-Strips.  Able to shower after staples are removed.  ACTIVITY  o Increase activity slowly as tolerated, but follow the weight bearing instructions below.   o No driving for 6 weeks or until further direction given by your physician.  You cannot drive while taking narcotics.  o No lifting or carrying greater than 10 lbs. until further directed by your surgeon. o Avoid periods of inactivity such as sitting longer than an hour when not asleep. This helps prevent blood clots.  o You may return to work once you are authorized by your doctor.     WEIGHT BEARING  Weightbearing as tolerated   EXERCISES Gait training and ambulation with a walker  CONSTIPATION  Constipation is defined medically as fewer than three stools per week and severe constipation as less than one stool per week.  Even if you have a regular  bowel pattern at home, your normal regimen is likely to be disrupted due to multiple reasons following surgery.  Combination of anesthesia, postoperative narcotics, change in appetite and fluid intake all can affect your bowels.   YOU MUST use at least one of the following options; they are listed in order of increasing strength to get the job done.  They are all available over the counter, and you may need to use some, POSSIBLY even all of these options:    Drink plenty of fluids (prune juice may be helpful) and high fiber foods Colace 100 mg by mouth twice a day  Senokot for constipation as directed and as needed Dulcolax (bisacodyl), take with full glass of water  Miralax (polyethylene glycol) once or twice a day as needed.  If you have tried all these things and are unable to have a bowel movement in the first 3-4 days after surgery call either your surgeon or your primary doctor.    If you experience loose stools or diarrhea, hold the medications until you stool forms back up.  If your symptoms do not get better within 1 week or if they get worse, check with your doctor.  If you experience "the worst abdominal pain ever" or develop nausea or vomiting, please contact the office immediately for further recommendations for treatment.   ITCHING:  If you experience itching with your medications, try taking only a single pain pill, or even  half a pain pill at a time.  You can also use Benadryl over the counter for itching or also to help with sleep.   TED HOSE STOCKINGS:  Use stockings on both legs until for at least 2 weeks or as directed by physician office. They may be removed at night for sleeping.  MEDICATIONS:  See your medication summary on the After Visit Summary that nursing will review with you.  You may have some home medications which will be placed on hold until you complete the course of blood thinner medication.  It is important for you to complete the blood thinner medication as  prescribed.  PRECAUTIONS:  If you experience chest pain or shortness of breath - call 911 immediately for transfer to the hospital emergency department.   If you develop a fever greater that 101 F, purulent drainage from wound, increased redness or drainage from wound, foul odor from the wound/dressing, or calf pain - CONTACT YOUR SURGEON.                                                   FOLLOW-UP APPOINTMENTS:  If you do not already have a post-op appointment, please call the office for an appointment to be seen by your surgeon.  Guidelines for how soon to be seen are listed in your After Visit Summary, but are typically between 1-4 weeks after surgery.  OTHER INSTRUCTIONS:     MAKE SURE YOU:   Understand these instructions.   Get help right away if you are not doing well or get worse.    Thank you for letting us be a part of your medical care team.  It is a privilege we respect greatly.  We hope these instructions will help you stay on track for a fast and full recovery!

## 2018-02-09 NOTE — Progress Notes (Signed)
Physical Therapy Treatment Patient Details Name: Vanessa KocherLeo L Reifschneider MRN: [id number] DOB: February 29, 1922 Today's Date: 02/09/2018    History of Present Illness 85 y/o female here s/p L hip fx with ORIF repair 02/06/18.    PT Comments    Participated in exercises as described below.  Bed mobility and gait 7375' with walker and min guard/assist.  PT with poor hand placements and general safety deficits.  She required max verbal and tactile cues for placement.  Gait generally unsteady and requires hands on assist at all times.  Limited by general post op pain and soreness.  Remained in recliner after session.   Follow Up Recommendations  SNF     Equipment Recommendations  Rolling walker with 5" wheels    Recommendations for Other Services       Precautions / Restrictions Precautions Precautions: Fall Restrictions Weight Bearing Restrictions: No LLE Weight Bearing: Weight bearing as tolerated    Mobility  Bed Mobility Overal bed mobility: Needs Assistance Bed Mobility: Supine to Sit     Supine to sit: Min assist        Transfers Overall transfer level: Needs assistance Equipment used: Rolling walker (2 wheeled) Transfers: Sit to/from Stand Sit to Stand: Min guard;Min assist         General transfer comment: Patient requires cues for hand placement for effective transfer  Ambulation/Gait Ambulation/Gait assistance: Min guard;Min assist Gait Distance (Feet): 75 Feet Assistive device: Rolling walker (2 wheeled) Gait Pattern/deviations: Step-to pattern;Step-through pattern;Decreased step length - right;Narrow base of support Gait velocity: decreased   General Gait Details: irregular step pattern limtied by pain.  Unsteady with gait and requires +1 assist at all times for mobility.   Stairs             Wheelchair Mobility    Modified Rankin (Stroke Patients Only)       Balance Overall balance assessment: Needs assistance Sitting-balance support: Bilateral upper  extremity supported;Feet supported Sitting balance-Leahy Scale: Good     Standing balance support: Bilateral upper extremity supported Standing balance-Leahy Scale: Poor Standing balance comment: heavy use of UEs becuase of L hip pain and limited tolerance                            Cognition Arousal/Alertness: Awake/alert Behavior During Therapy: WFL for tasks assessed/performed Overall Cognitive Status: Within Functional Limits for tasks assessed                                        Exercises Total Joint Exercises Ankle Circles/Pumps: AROM;10 reps;Both;Supine Quad Sets: AROM;10 reps;Both;Supine Gluteal Sets: AROM;10 reps;Supine;Both Heel Slides: 10 reps;Supine;Left;AAROM Hip ABduction/ADduction: AAROM;Left;Supine;10 reps    General Comments        Pertinent Vitals/Pain Pain Assessment: 0-10 Pain Score: 5  Pain Location: L hip and more down into thigh Pain Descriptors / Indicators: Aching;Grimacing    Home Living                      Prior Function            PT Goals (current goals can now be found in the care plan section) Progress towards PT goals: Progressing toward goals    Frequency    BID      PT Plan Current plan remains appropriate    Co-evaluation  AM-PAC PT "6 Clicks" Mobility   Outcome Measure  Help needed turning from your back to your side while in a flat bed without using bedrails?: A Little Help needed moving from lying on your back to sitting on the side of a flat bed without using bedrails?: A Little Help needed moving to and from a bed to a chair (including a wheelchair)?: A Little Help needed standing up from a chair using your arms (e.g., wheelchair or bedside chair)?: A Little Help needed to walk in hospital room?: A Little Help needed climbing 3-5 steps with a railing? : A Lot 6 Click Score: 17    End of Session Equipment Utilized During Treatment: Gait belt Activity  Tolerance: Patient tolerated treatment well;Patient limited by pain Patient left: in chair;with call bell/phone within reach;with chair alarm set;with family/visitor present   Pain - Right/Left: Left Pain - part of body: Hip     Time: 1000-1016 PT Time Calculation (min) (ACUTE ONLY): 16 min  Charges:  $Gait Training: 8-22 mins                     Danielle Dess, PTA 02/09/18, 10:30 AM

## 2018-02-09 NOTE — Progress Notes (Signed)
   Subjective: 3 Days Post-Op Procedure(s) (LRB): INTRAMEDULLARY (IM) NAIL INTERTROCHANTRIC (Left) Patient reports pain as mild.   Patient is well, and has had no acute complaints or problems Patient did very well with therapy yesterday.  Was able to ambulate 75 feet Plan is to go Rehab after hospital stay. no nausea and no vomiting Patient denies any chest pains or shortness of breath. Objective: Vital signs in last 24 hours: Temp:  [97.5 F (36.4 C)-98.5 F (36.9 C)] 97.5 F (36.4 C) (02/05 2302) Pulse Rate:  [69-89] 69 (02/05 2302) Resp:  [16-20] 16 (02/05 1707) BP: (117-142)/(56-76) 117/59 (02/05 2302) SpO2:  [95 %-96 %] 96 % (02/05 2302) well approximated incision Heels are non tender and elevated off the bed using rolled towels Intake/Output from previous day: 02/05 0701 - 02/06 0700 In: -  Out: 100 [Urine:100] Intake/Output this shift: No intake/output data recorded.  Recent Labs    02/07/18 0321 02/09/18 0357  HGB 10.4* 9.6*   Recent Labs    02/07/18 0321 02/09/18 0357  WBC 9.6 11.9*  RBC 3.13* 3.02*  HCT 30.4* 29.5*  PLT 203 220   Recent Labs    02/07/18 0321 02/09/18 0357  NA 138 138  K 3.5 3.6  CL 107 104  CO2 26 30  BUN 17 24*  CREATININE 0.81 0.96  GLUCOSE 115* 111*  CALCIUM 8.5* 8.8*   No results for input(s): LABPT, INR in the last 72 hours.  EXAM General - Patient is Alert, Appropriate and Oriented Extremity - Neurologically intact Neurovascular intact Sensation intact distally Intact pulses distally Dorsiflexion/Plantar flexion intact No cellulitis present Compartment soft Dressing - dressing C/D/I Motor Function - intact, moving foot and toes well on exam.    Past Medical History:  Diagnosis Date  . GERD (gastroesophageal reflux disease)   . Hypertension     Assessment/Plan: 3 Days Post-Op Procedure(s) (LRB): INTRAMEDULLARY (IM) NAIL INTERTROCHANTRIC (Left) Active Problems:   Hip fracture (HCC)  Estimated body mass  index is 13.27 kg/m as calculated from the following:   Height as of this encounter: 5\' 8"  (1.727 m).   Weight as of this encounter: 39.6 kg. Up with therapy Discharge to SNF plan for today  Labs: None DVT Prophylaxis - Lovenox, Foot Pumps and TED hose Weight-Bearing as tolerated to left leg Patient will need to follow-up in our clinic in 2 weeks. Continue Lovenox 2 weeks 30 mg twice daily.  If patient goes home before 2 weeks may change this to 40 mg daily for the remainder of the time. No shower until the staples are removed Continue TED stockings  Dedra Skeens PA-C [facility] 02/09/2018, 6:04 AM

## 2018-02-09 NOTE — Discharge Summary (Signed)
[facility] - [place] at [facility]   PATIENT NAME: Vanessa Brock    MR#:  [id number]  DATE OF BIRTH:  07/04/1922  DATE OF ADMISSION:  02/05/2018 ADMITTING PHYSICIAN: Adrian Saran, MD  DATE OF DISCHARGE: 02/09/2018  PRIMARY CARE PHYSICIAN: Patrice Paradise, MD   ADMISSION DIAGNOSIS:  Closed fracture of neck of left femur, initial encounter (HCC) [S72.002A]  DISCHARGE DIAGNOSIS:  Active Problems:   Hip fracture (HCC)   SECONDARY DIAGNOSIS:   Past Medical History:  Diagnosis Date  . GERD (gastroesophageal reflux disease)   . Hypertension      ADMITTING HISTORY  HISTORY OF PRESENT ILLNESS:  Vanessa Brock  is a 85 y.o. female with a known history of hypertension who presents after mechanical fall. Patient arrived to the ER via EMS after she fell from a threshold.  She denies hitting her head.  She was complaining of left hip pain.  She is found to have a left hip fracture.  ER physician has spoken with the orthopedic surgeon who plans to take patient to the OR tomorrow.  HOSPITAL COURSE:   1. Atrial fibrillation with rapid ventricular response. Started post-op. Patient already on Cardizem CD at home.  Added low-dose metoprolol.  Put on telemetry monitoring.  Ordered an echocardiogram which showed EF 60% with nothing acute.  Started on low-dose Eliquis.  Will need cardiology f/u as OP. 2. Left femoral neck fracture requiring operative repair.  Doing well postoperatively. Pain meds PRN. PT. SNF at discharge 3. Essential hypertension on Metoprolol and diltiazem.  Added metoprolol low dose secondary to atrial fibrillation. 4. GERD on PPI 5. Acute kidney injury.  Improved with IV fluids.  Stopped IV fluids. 6. Impaired fasting glucose.  Patient not a diabetic at this time.  Stable for d/c to SNF  CONSULTS OBTAINED:  Treatment Team:  Signa Kell, MD Donato Heinz, MD  DRUG ALLERGIES:   Allergies  Allergen Reactions  . Aspirin Nausea And Vomiting  . Sulfur Hives     DISCHARGE MEDICATIONS:   Allergies as of 02/09/2018      Reactions   Aspirin Nausea And Vomiting   Sulfur Hives      Medication List    STOP taking these medications   cephALEXin 500 MG capsule Commonly known as:  KEFLEX   losartan 25 MG tablet Commonly known as:  COZAAR     TAKE these medications   apixaban 2.5 MG Tabs tablet Commonly known as:  ELIQUIS Take 1 tablet (2.5 mg total) by mouth 2 (two) times daily.   calcium-vitamin D 500-200 MG-UNIT tablet Take 1 tablet by mouth 2 (two) times daily.   diltiazem 360 MG 24 hr capsule Commonly known as:  TIAZAC Take 360 mg by mouth daily.   metoprolol tartrate 25 MG tablet Commonly known as:  LOPRESSOR Take 0.5 tablets (12.5 mg total) by mouth 2 (two) times daily.   multivitamin capsule Take 1 capsule by mouth daily.   omeprazole 20 MG capsule Commonly known as:  PRILOSEC Take 20 mg by mouth daily.   oxyCODONE 5 MG immediate release tablet Commonly known as:  Oxy IR/ROXICODONE Take 1 tablet (5 mg total) by mouth every 4 (four) hours as needed for severe pain (pain score 7-10).   traMADol 50 MG tablet Commonly known as:  ULTRAM Take 1 tablet (50 mg total) by mouth every 4 (four) hours as needed for moderate pain (pain score 4-6).       Today   VITAL SIGNS:  Blood pressure (!) 152/55, pulse 95, temperature 98.5 F (36.9 C), temperature source Oral, resp. rate 16, height 5\' 8"  (1.727 m), weight 39.6 kg, SpO2 96 %.  I/O:    Intake/Output Summary (Last 24 hours) at 02/09/2018 1029 Last data filed at 02/09/2018 0920 Gross per 24 hour  Intake 360 ml  Output 100 ml  Net 260 ml    PHYSICAL EXAMINATION:  Physical Exam  GENERAL:  85 y.o.-year-old patient lying in the bed with no acute distress.  LUNGS: Normal breath sounds bilaterally, no wheezing, rales,rhonchi or crepitation. No use of accessory muscles of respiration.  CARDIOVASCULAR: S1, S2 normal. No murmurs, rubs, or gallops.  ABDOMEN: Soft,  non-tender, non-distended. Bowel sounds present. No organomegaly or mass.  NEUROLOGIC: Moves all 4 extremities. PSYCHIATRIC: The patient is alert and oriented x 3.  SKIN: No obvious rash, lesion, or ulcer.   DATA REVIEW:   CBC Recent Labs  Lab 02/09/18 0357  WBC 11.9*  HGB 9.6*  HCT 29.5*  PLT 220    Chemistries  Recent Labs  Lab 02/09/18 0357  NA 138  K 3.6  CL 104  CO2 30  GLUCOSE 111*  BUN 24*  CREATININE 0.96  CALCIUM 8.8*    Cardiac Enzymes No results for input(s): TROPONINI in the last 168 hours.  Microbiology Results  Results for orders placed or performed during the hospital encounter of 02/05/18  Surgical pcr screen     Status: None   Collection Time: 02/05/18  8:17 PM  Result Value Ref Range Status   MRSA, PCR NEGATIVE NEGATIVE Final   Staphylococcus aureus NEGATIVE NEGATIVE Final    Comment: (NOTE) The Xpert SA Assay (FDA approved for NASAL specimens in patients 822 years of age and older), is one component of a comprehensive surveillance program. It is not intended to diagnose infection nor to guide or monitor treatment. Performed at [facility], [address] [place], [place] [address]   Urine Culture     Status: None   Collection Time: 02/06/18  2:10 PM  Result Value Ref Range Status   Specimen Description   Final    URINE, CATHETERIZED Performed at [facility], [address] [place], [place] [address]    Special Requests   Final    NONE Performed at [facility], [address] [place], [place] [address]    Culture   Final    NO GROWTH Performed at [facility], [address] [place], [place] [address]    Report Status 02/07/2018 FINAL  Final    RADIOLOGY:  No results found.  Follow up with PCP in 1 week.  Management plans discussed with the patient, family and they are in agreement.  CODE STATUS:     Code Status Orders  (From admission, onward)         Start     Ordered    02/05/18 1957  Do not attempt resuscitation (DNR)  Continuous    Question Answer Comment  In the event of cardiac or respiratory ARREST Do not call a "code blue"   In the event of cardiac or respiratory ARREST Do not perform Intubation, CPR, defibrillation or ACLS   In the event of cardiac or respiratory ARREST Use medication by any route, position, wound care, and other measures to relive pain and suffering. May use oxygen, suction and manual treatment of airway obstruction as needed for comfort.      02/05/18 1956  Code Status History    This patient has a current code status but no historical code status.      TOTAL TIME TAKING CARE OF THIS PATIENT ON DAY OF DISCHARGE: more than 30 minutes.   Molinda Bailiff Alberta Cairns M.D on 02/09/2018 at 10:29 AM  Between 7am to 6pm - Pager - [phone]  After 6pm go to [email or web address] - password [email or web address] [facility]  [facility] [facility]  Office  [phone]  CC: Primary care physician; Patrice Paradise, MD  Note: This dictation was prepared with Dragon dictation along with smaller phrase technology. Any transcriptional errors that result from this process are unintentional.

## 2018-02-09 NOTE — Clinical Social Work Placement (Signed)
   CLINICAL SOCIAL WORK PLACEMENT  NOTE  Date:  02/09/2018  Patient Details  Name: Vanessa Brock MRN: [id number] Date of Birth: 1922/09/20  Clinical Social Work is seeking post-discharge placement for this patient at the Skilled  Nursing Facility level of care (*CSW will initial, date and re-position this form in  chart as items are completed):  Yes   Patient/family provided with [facility] Clinical Social Work Department's list of facilities offering this level of care within the geographic area requested by the patient (or if unable, by the patient's family).  Yes   Patient/family informed of their freedom to choose among providers that offer the needed level of care, that participate in Medicare, Medicaid or managed care program needed by the patient, have an available bed and are willing to accept the patient.  Yes   Patient/family informed of 's ownership interest in [facility] and [facility], as well as of the fact that they are under no obligation to receive care at these facilities.  PASRR submitted to EDS on 02/06/18     PASRR number received on 02/06/18     Existing PASRR number confirmed on       FL2 transmitted to all facilities in geographic area requested by pt/family on 02/06/18     FL2 transmitted to all facilities within larger geographic area on       Patient informed that his/her managed care company has contracts with or will negotiate with certain facilities, including the following:        Yes   Patient/family informed of bed offers received.  Patient chooses bed at [facility] )     Physician recommends and patient chooses bed at      Patient to be transferred to [facility] ) on 02/09/18.  Patient to be transferred to facility by [facility] )     Patient family notified on 02/09/18 of transfer.  Name of family member notified:  (Patient's son [name] is at bedside and aware of D/C today. )     PHYSICIAN       Additional  Comment:    _______________________________________________ Voncille Simm, Darleen Crocker, LCSW 02/09/2018, 12:26 PM

## 2018-02-09 NOTE — Progress Notes (Signed)
Report called in to [place] at [facility]. EMS called for transport to [facility].   Suzan Slick, RN

## 2018-02-09 NOTE — Progress Notes (Signed)
Telemetry discontinued. PIV removed. VSS. All belongings packed up. EMS arrived to transport pt to [facility].   Suzan Slick, RN

## 2018-02-09 NOTE — Progress Notes (Signed)
Patient is medically stable for D/C to [facility] today. Per Sue Lush admissions coordinator at [facility] patient can come today to room 220. RN will call report at [phone] and arrange EMS for transport. Clinical Child psychotherapist (CSW) sent D/C orders to [facility] via [facility]. Patient is aware of above. Patient's son [name] is at bedside and aware of above. Please reconsult if future social work needs arise. CSW signing off.   [employer], LCSW [phone]

## 2018-02-14 DIAGNOSIS — S72001A Fracture of unspecified part of neck of right femur, initial encounter for closed fracture: Secondary | ICD-10-CM

## 2018-02-14 DIAGNOSIS — I10 Essential (primary) hypertension: Secondary | ICD-10-CM

## 2018-02-14 DIAGNOSIS — I480 Paroxysmal atrial fibrillation: Secondary | ICD-10-CM

## 2018-02-14 DIAGNOSIS — K219 Gastro-esophageal reflux disease without esophagitis: Secondary | ICD-10-CM | POA: Diagnosis not present

## 2018-03-08 ENCOUNTER — Ambulatory Visit (INDEPENDENT_AMBULATORY_CARE_PROVIDER_SITE_OTHER): Payer: Medicare Other | Admitting: Nurse Practitioner

## 2018-03-08 ENCOUNTER — Encounter: Payer: Self-pay | Admitting: Nurse Practitioner

## 2018-03-08 VITALS — BP 130/60 | HR 73 | Ht 63.0 in | Wt 82.8 lb

## 2018-03-08 DIAGNOSIS — I10 Essential (primary) hypertension: Secondary | ICD-10-CM

## 2018-03-08 DIAGNOSIS — I480 Paroxysmal atrial fibrillation: Secondary | ICD-10-CM

## 2018-03-08 NOTE — Patient Instructions (Signed)
Medication Instructions:  Your physician recommends that you continue on your current medications as directed. Please refer to the Current Medication list given to you today.  If you need a refill on your cardiac medications before your next appointment, please call your pharmacy.   Lab work: Your physician recommends that you have lab work today(CBC, BMET)   If you have labs (blood work) drawn today and your tests are completely normal, you will receive your results only by: Marland Kitchen MyChart Message (if you have MyChart) OR . A paper copy in the mail If you have any lab test that is abnormal or we need to change your treatment, we will call you to review the results.  Testing/Procedures: None ordered   Follow-Up: At [facility], you and your health needs are our priority.  As part of our continuing mission to provide you with exceptional heart care, we have created designated Provider Care Teams.  These Care Teams include your primary Cardiologist (physician) and Advanced Practice Providers (APPs -  Physician Assistants and Nurse Practitioners) who all work together to provide you with the care you need, when you need it. You will need a follow up appointment in 2-3 months.  You may see Julien Nordmann, MD or Nicolasa Ducking, NP.

## 2018-03-08 NOTE — Progress Notes (Signed)
Cardiology Clinic Note   Patient Name: Vanessa Brock Date of Encounter: 03/08/2018  Primary Care Provider:  Patrice Paradise, MD Primary Cardiologist:  Julien Nordmann, MD  Patient Profile    85 year old female with a history of hypertension and GERD who was recently admitted for left femoral neck fracture status post ORIF, he developed A. fib postoperatively, who presents to establish cardiology care.  Past Medical History    Past Medical History:  Diagnosis Date  . AKI (acute kidney injury) (HCC)    a. 02/2018 in setting of hip fx -->improved w/ IVF.  Marland Kitchen Closed displaced fracture of left femoral neck (HCC)    a. 02/2018 s/p ORIF.  Marland Kitchen Diastolic dysfunction    a. 02/2018 Limited Echo: EF > 65%, impaired diast relaxation. Nl RV fxn.   Marland Kitchen GERD (gastroesophageal reflux disease)   . Hypertension   . Impaired fasting glucose   . Persistent atrial fibrillation    a. 02/2018 Dx following ORIF L hip 2/2 fx; b. CHA2DS2VASc = 4-->Eliquis.   Past Surgical History:  Procedure Laterality Date  . ABDOMINAL HYSTERECTOMY    . CHOLECYSTECTOMY    . INTRAMEDULLARY (IM) NAIL INTERTROCHANTERIC Left 02/06/2018   Procedure: INTRAMEDULLARY (IM) NAIL INTERTROCHANTRIC;  Surgeon: Donato Heinz, MD;  Location: [facility] ORS;  Service: Orthopedics;  Laterality: Left;    Allergies  Allergies  Allergen Reactions  . Aspirin Nausea And Vomiting  . Sulfur Hives    History of Present Illness    85 year old female with the above past medical history including hypertension and GERD.  She has no prior cardiac history.  Historically, she is only ever taken diltiazem and Prilosec.  She had been living locally by herself and was very active around her house and in her yard.  On February 05, 2018, she was at her daughter's house and was walking into the garage when her foot caught the lip of the entryway between the home in the garage, and she fell forward.  She landed on her left side and had significant pain.  She was  taken to [facility] where she was found to have a left femoral neck fracture.  She underwent ORIF of the left hip without any significant complications.  Postoperatively, she did develop rate controlled atrial fibrillation.  Metoprolol and Eliquis were added to her regimen.  She was completely asymptomatic.  Echocardiogram was performed and showed normal LV function with impaired diastolic relaxation.  She was subsequently discharged to rehab.  Since her discharge, she has remained at [facility], where she has been receiving physical therapy.  She notes steady improvement and is using a walker.  She does not have any significant pain in her left leg.  She denies any prior history of chest pain, palpitations, dyspnea, PND, orthopnea, dizziness, syncope, edema, or early satiety.  She is in sinus rhythm today. Home Medications    Prior to Admission medications   Medication Sig Start Date End Date Taking? Authorizing Provider  apixaban (ELIQUIS) 2.5 MG TABS tablet Take 1 tablet (2.5 mg total) by mouth 2 (two) times daily. 02/09/18  Yes Sudini, Wardell Heath, MD  Calcium Carb-Cholecalciferol (CALCIUM-VITAMIN D) 500-200 MG-UNIT tablet Take 1 tablet by mouth 2 (two) times daily.   Yes [provider]  diltiazem (TIAZAC) 360 MG 24 hr capsule Take 360 mg by mouth daily. 01/15/18  Yes [provider]  metoprolol tartrate (LOPRESSOR) 25 MG tablet Take 0.5 tablets (12.5 mg total) by mouth 2 (two) times daily. 02/09/18  Yes Milagros Loll, MD  Multiple Vitamin (MULTIVITAMIN) capsule Take 1 capsule by mouth daily.   Yes [provider]  omeprazole (PRILOSEC) 20 MG capsule Take 20 mg by mouth daily. 01/15/18  Yes [provider]  oxyCODONE (OXY IR/ROXICODONE) 5 MG immediate release tablet Take 1 tablet (5 mg total) by mouth every 4 (four) hours as needed for severe pain (pain score 7-10). 02/09/18  Yes Dedra Skeens, PA-C  traMADol (ULTRAM) 50 MG tablet Take 1 tablet (50 mg total) by mouth  every 4 (four) hours as needed for moderate pain (pain score 4-6). 02/09/18  Yes Dedra Skeens, PA-C    Family History    Family History  Problem Relation Age of Onset  . Heart disease Mother        died @ 43 - ? heart issues  . Other Father        died @ 17  . Other Sister        60   She indicated that her mother is deceased. She indicated that her father is deceased. She indicated that her sister is alive.  Social History    Social History   Socioeconomic History  . Marital status: Divorced    Spouse name: Not on file  . Number of children: Not on file  . Years of education: Not on file  . Highest education level: Not on file  Occupational History  . Not on file  Social Needs  . Financial resource strain: Not on file  . Food insecurity:    Worry: Not on file    Inability: Not on file  . Transportation needs:    Medical: Not on file    Non-medical: Not on file  Tobacco Use  . Smoking status: Never Smoker  . Smokeless tobacco: Never Used  Substance and Sexual Activity  . Alcohol use: No  . Drug use: No  . Sexual activity: Never  Lifestyle  . Physical activity:    Days per week: Not on file    Minutes per session: Not on file  . Stress: Not on file  Relationships  . Social connections:    Talks on phone: Not on file    Gets together: Not on file    Attends religious service: Not on file    Active member of club or organization: Not on file    Attends meetings of clubs or organizations: Not on file    Relationship status: Not on file  . Intimate partner violence:    Fear of current or ex partner: Not on file    Emotionally abused: Not on file    Physically abused: Not on file    Forced sexual activity: Not on file  Other Topics Concern  . Not on file  Social History Narrative   Was living by herself in [place] - family nearby.  Currently in [facility] s/p ORIF of L hip.     Review of Systems    General:  No chills, fever, night sweats or weight changes.    Cardiovascular:  No chest pain, dyspnea on exertion, edema, orthopnea, palpitations, paroxysmal nocturnal dyspnea. Dermatological: No rash, lesions/masses Respiratory: No cough, dyspnea Urologic: No hematuria, dysuria Abdominal:   No nausea, vomiting, diarrhea, bright red blood per rectum, melena, or hematemesis Neurologic:  No visual changes, wkns, changes in mental status. MSK: Still somewhat slow to get around with minimal left hip pain. All other systems reviewed and are otherwise negative except as noted above.  Physical  Exam    VS:  BP 130/60 (BP Location: Left Arm, Patient Position: Sitting, Cuff Size: Normal)   Pulse 73   Ht  (1.6 m)   Wt 82 lb 12 oz (37.5 kg)   BMI 14.66 kg/m  , BMI Body mass index is 14.66 kg/m. GEN: Frail, in no acute distress. HEENT: normal. Neck: Supple, no JVD, carotid bruits, or masses. Cardiac: RRR, no murmurs, rubs, or gallops. No clubbing, cyanosis, edema.  Radials/DP/PT 2+ and equal bilaterally.  Respiratory:  Respirations regular and unlabored, clear to auscultation bilaterally. GI: Soft, nontender, nondistended, BS + x 4. MS: no deformity or atrophy. Skin: warm and dry, no rash. Neuro:  Strength and sensation are intact. Psych: Normal affect.  Accessory Clinical Findings    ECG personally reviewed by me today-regular sinus rhythm, 73, PVC, right bundle branch block, LVH- No acute changes  Lab Results  Component Value Date   WBC 11.9 (H) 02/09/2018   HGB 9.6 (L) 02/09/2018   HCT 29.5 (L) 02/09/2018   MCV 97.7 02/09/2018   PLT 220 02/09/2018   Lab Results  Component Value Date   CREATININE 0.96 02/09/2018   BUN 24 (H) 02/09/2018   NA 138 02/09/2018   K 3.6 02/09/2018   CL 104 02/09/2018   CO2 30 02/09/2018   Lab Results  Component Value Date   TSH 0.709 02/09/2018    Assessment & Plan   1.  Paroxysmal atrial fibrillation: Patient recently admitted with left hip fracture and developed rate controlled A. fib  postoperatively.  Hospital EKGs reviewed.  She was asymptomatic and placed on beta-blocker and diltiazem therapy.  Eliquis was added at 2.5 mg twice daily in the setting of a CHA2DS2VASc of 4 with weight less than 60 kg and age greater than 80.  Echocardiogram during admission showed normal LV function.  TSH was within normal limits at the time of diagnosis.  She has been doing well at [facility] where she continues to rehab following hip surgery.  She is in sinus rhythm today.  She denies any palpitations, chest pain, dyspnea, presyncope.  Further, she has not had any melena, hematuria, or other known bleeding complications.  Her daughter is present with her today and we had a long discussion about the diagnosis and management of atrial fibrillation.  I will follow-up a CBC and basic metabolic panel today.  Continue AV nodal blocking agents as prescribed.  2.  Essential hypertension: Stable on beta-blocker and calcium channel blocker therapy.  3.  GERD: Stable on PPI therapy.  4.  Status post left femoral neck fracture: Status post ORIF.  Continues to rehab and has been receiving physical therapy.  Hopes to be discharged to home within the next month.  5.  Disposition: Follow-up CBC and basic metabolic panel today in the setting of Eliquis therapy.  Follow-up in clinic in approximately 2 to 3 months or sooner if necessary.  Nicolasa Ducking, NP 03/08/2018, 1:45 PM

## 2018-03-09 LAB — CBC
Hematocrit: 36.2 % (ref 34.0–46.6)
Hemoglobin: 12.5 g/dL (ref 11.1–15.9)
MCH: 33.4 pg — ABNORMAL HIGH (ref 26.6–33.0)
MCHC: 34.5 g/dL (ref 31.5–35.7)
MCV: 97 fL (ref 79–97)
PLATELETS: 243 10*3/uL (ref 150–450)
RBC: 3.74 x10E6/uL — ABNORMAL LOW (ref 3.77–5.28)
RDW: 12.6 % (ref 11.7–15.4)
WBC: 9.4 10*3/uL (ref 3.4–10.8)

## 2018-03-09 LAB — BASIC METABOLIC PANEL
BUN/Creatinine Ratio: 19 (ref 12–28)
BUN: 22 mg/dL (ref 10–36)
CO2: 24 mmol/L (ref 20–29)
Calcium: 9.9 mg/dL (ref 8.7–10.3)
Chloride: 103 mmol/L (ref 96–106)
Creatinine, Ser: 1.17 mg/dL — ABNORMAL HIGH (ref 0.57–1.00)
GFR calc Af Amer: 46 mL/min/{1.73_m2} — ABNORMAL LOW (ref >59)
GFR calc non Af Amer: 40 mL/min/{1.73_m2} — ABNORMAL LOW (ref >59)
GLUCOSE: 123 mg/dL — AB (ref 65–99)
POTASSIUM: 4.6 mmol/L (ref 3.5–5.2)
Sodium: 140 mmol/L (ref 134–144)

## 2018-03-10 ENCOUNTER — Telehealth: Payer: Self-pay | Admitting: Nurse Practitioner

## 2018-03-10 NOTE — Telephone Encounter (Signed)
Pt daughter states Eliquis is too expensive and would like to discuss an alternative. Please call to discuss

## 2018-03-13 NOTE — Telephone Encounter (Signed)
Contacted the pt daughter Jasmine December. DPR on file. Jasmine December sts that she was able to locate a coupon for Eliquis online and has submitted it to the patient's pharmacy. Jasmine December sts that if approved it would bring the cost down significantly, she plan on f/u with the pharmacy today. Adv her that Xarelto could possibly be an alternative. Adv her to contact the pt pharmacy to see if Xarelto would be more cost effective then Eliquis. If so, she should call back to let us know. Adv her that Coumadin is also an option, it is generic and more cost effective, but does require monitoring. Jasmine December says that she would like to avoid Coumadin if possible. Adv her that if needed could try to apply for patients assistance for Eliquis. Jasmine December will call back if further assistance is needed.

## 2018-05-25 ENCOUNTER — Telehealth: Payer: Self-pay

## 2018-05-25 NOTE — Telephone Encounter (Signed)
Called patient.  No answer. LMOV.  Need to change appointment to Telehealth visit.

## 2018-06-06 ENCOUNTER — Ambulatory Visit: Payer: Medicare Other | Admitting: Cardiovascular Disease

## 2018-06-23 ENCOUNTER — Telehealth: Payer: Self-pay | Admitting: Cardiovascular Disease

## 2018-06-23 NOTE — Telephone Encounter (Signed)
Daughter [name] would like to be on the visitor list for upcoming appt as patient is 58 HOH and she helps with care.

## 2018-06-23 NOTE — Telephone Encounter (Signed)
To Dr. Rockey Situ to review- ok for daughter to come to appt? Not scheduled until 07/25/18.   Had refused e-visit.

## 2018-06-26 NOTE — Telephone Encounter (Signed)
Appointment notes updated. Daughter notified.

## 2018-06-26 NOTE — Telephone Encounter (Signed)
That would be fine 

## 2018-07-11 ENCOUNTER — Ambulatory Visit: Payer: Medicare Other | Admitting: Cardiovascular Disease

## 2018-07-17 ENCOUNTER — Other Ambulatory Visit: Payer: Self-pay | Admitting: *Deleted

## 2018-07-17 ENCOUNTER — Other Ambulatory Visit: Payer: Self-pay | Admitting: Cardiovascular Disease

## 2018-07-17 MED ORDER — METOPROLOL TARTRATE 25 MG PO TABS: 12.5000 mg | ORAL_TABLET | Freq: Two times a day (BID) | ORAL | 1 refills | Status: DC

## 2018-07-17 NOTE — Telephone Encounter (Signed)
Please advise if ok to refill Metoprolol Tartrate 25 mg 0.5 mg tablet bid.  90 day supply to [facility].

## 2018-07-17 NOTE — Telephone Encounter (Signed)
Please see anti-coag refill request below.

## 2018-07-17 NOTE — Telephone Encounter (Signed)
Pt's wt is 37.5 kg, age 85, SCr 1.17, CrCl 17.41 based on labs from March.   Per protocol, I have to contact MD for alternative anticoagulant if CrCl is < 25 (based on last 2 results) Pt is currently on Eliquis 2.5 mg BID.  Please advise.

## 2018-07-17 NOTE — Telephone Encounter (Signed)
°*  STAT* If patient is at the pharmacy, call can be transferred to refill team.   1. Which medications need to be refilled? (please list name of each medication and dose if known) Eliquis 2.5 MG - 1 tablet 2 times daily  metoprolol tartrate 25 MG 0.5 tablets 2 times daily   2. Which pharmacy/location (including street and city if local pharmacy) [facility] [place] [facility] [place] - no longer [facility] [employer]  3. Do they need a 30 day or 90 day supply? 90 day

## 2018-07-19 NOTE — Telephone Encounter (Signed)
Ok to reflil eliquis 2.5 BID

## 2018-07-20 ENCOUNTER — Other Ambulatory Visit: Payer: Self-pay | Admitting: Cardiovascular Disease

## 2018-07-20 MED ORDER — APIXABAN 2.5 MG PO TABS: 2.5000 mg | ORAL_TABLET | Freq: Two times a day (BID) | ORAL | 1 refills | Status: DC

## 2018-07-20 NOTE — Telephone Encounter (Signed)
°*  STAT* If patient is at the pharmacy, call can be transferred to refill team.   1. Which medications need to be refilled? (please list name of each medication and dose if known) Eliquis 2.5 mg po BID   2. Which pharmacy/location (including street and city if local pharmacy) is medication to be sent to? [facility] [address]   3. Do they need a 30 day or 90 day supply? [place]

## 2018-07-20 NOTE — Telephone Encounter (Signed)
Per Dr. Rockey Situ okay to send as requested. Please refer to documentation from [facility], RN to Dr. Rockey Situ for more details.   Eliquis 2.5mg  refill request received; pt is 85 yrs old, wt-37.5kg, Crea-1.17 on 03/08/2018, last seen by Ignacia Bayley on 03/08/2018, diagnosis AFIB; will send in refill to requested pharmacy.

## 2018-07-20 NOTE — Addendum Note (Signed)
Addended by: Derrel Nip B on: 07/20/2018 10:07 AM   Modules accepted: Orders

## 2018-07-20 NOTE — Telephone Encounter (Signed)
Refill Request.  

## 2018-07-20 NOTE — Telephone Encounter (Signed)
Eliquis 2.5mg  refill request received; pt is 85 yrs old, wt-37.5kg, Crea-1.17 on 03/08/2018, last seen by Ignacia Bayley on 03/08/2018, diagnosis AFIB; will send in refill to requested pharmacy.

## 2018-07-21 DIAGNOSIS — I480 Paroxysmal atrial fibrillation: Secondary | ICD-10-CM | POA: Insufficient documentation

## 2018-07-21 NOTE — Progress Notes (Signed)
Cardiology Office Note  Date:  07/25/2018   ID:  Vanessa Brock, DOB [birth year], MRN [id number]  PCP:  Marinda Elk, MD   Chief Complaint  Patient presents with  . Other    3 month follow up. Patient denies chest pain and SOB. Meds reviewed verbally with patient.     HPI:  85 year old female with a history of hypertension  GERD left femoral neck fracture status post ORIF, 02/2018 developed A. fib postoperatively,  who presents for follow-up of her atrial fibrillation  February 05, 2018, was walking into the garage when her foot caught the lip of the entryway between the home in the garage, and she fell forward.    left femoral neck fracture.   She underwent ORIF of the left hip  Postoperatively, she did develop rate controlled atrial fibrillation.   Metoprolol and Eliquis were added to her regimen.  completely asymptomatic.    Echocardiogram was performed and showed normal LV function with impaired diastolic relaxation.  She was subsequently discharged to rehab.  Spent some time recovering at [facility],   physical therapy.  On last office visit several months ago was maintaining normal sinus rhythm  BP elevated today, anxious Weight down 87 to 83 pounds since 02/2018 Reports she is eating well Lives at home, independent Family nearby  No recent falls Denies any leg edema shortness of breath tachycardia or palpitations concerning for arrhythmia  EKG personally reviewed by myself on todays visit Shows normal sinus rhythm rate 65 bpm right bundle branch block   PMH:   has a past medical history of AKI (acute kidney injury) ([place]), Closed displaced fracture of left femoral neck ([place]), Diastolic dysfunction, GERD (gastroesophageal reflux disease), Hypertension, Impaired fasting glucose, and Persistent atrial fibrillation.  PSH:    Past Surgical History:  Procedure Laterality Date  . ABDOMINAL HYSTERECTOMY    . CHOLECYSTECTOMY    . HIP SURGERY  02/06/2018  .  INTRAMEDULLARY (IM) NAIL INTERTROCHANTERIC Left 02/06/2018   Procedure: INTRAMEDULLARY (IM) NAIL INTERTROCHANTRIC;  Surgeon: Dereck Leep, MD;  Location: [facility] ORS;  Service: Orthopedics;  Laterality: Left;    Current Outpatient Medications  Medication Sig Dispense Refill  . apixaban (ELIQUIS) 2.5 MG TABS tablet Take 1 tablet (2.5 mg total) by mouth 2 (two) times daily. 180 tablet 1  . Calcium Carb-Cholecalciferol (CALCIUM-VITAMIN D) 500-200 MG-UNIT tablet Take 1 tablet by mouth 2 (two) times daily.    Marland Kitchen diltiazem (TIAZAC) 360 MG 24 hr capsule Take 360 mg by mouth daily.    . metoprolol tartrate (LOPRESSOR) 25 MG tablet Take 0.5 tablets (12.5 mg total) by mouth 2 (two) times daily. 90 tablet 1  . Multiple Vitamin (MULTIVITAMIN) capsule Take 1 capsule by mouth daily.    Marland Kitchen omeprazole (PRILOSEC) 20 MG capsule Take 20 mg by mouth daily.    Marland Kitchen oxyCODONE (OXY IR/ROXICODONE) 5 MG immediate release tablet Take 1 tablet (5 mg total) by mouth every 4 (four) hours as needed for severe pain (pain score 7-10). 30 tablet 0  . traMADol (ULTRAM) 50 MG tablet Take 1 tablet (50 mg total) by mouth every 4 (four) hours as needed for moderate pain (pain score 4-6). 30 tablet 1   No current facility-administered medications for this visit.      Allergies:   Aspirin and Sulfur   Social History:  The patient  reports that she has never smoked. She has never used smokeless tobacco. She reports that she does not drink alcohol or use drugs.  Family History:   family history includes Heart disease in her mother; Other in her father and sister.    Review of Systems: Review of Systems  Constitutional: Negative.   HENT: Negative.   Respiratory: Negative.   Cardiovascular: Negative.   Gastrointestinal: Negative.   Musculoskeletal: Negative.   Neurological: Negative.   Psychiatric/Behavioral: Negative.   All other systems reviewed and are negative.   PHYSICAL EXAM: VS:  BP (!) 160/76 (BP Location: Left Arm,  Patient Position: Sitting, Cuff Size: Normal)   Pulse 65   Ht 5\' 3"  (1.6 m)   Wt 83 lb (37.6 kg)   BMI 14.70 kg/m  , BMI Body mass index is 14.7 kg/m. GEN: Well nourished, well developed, in no acute distress HEENT: normal Neck: no JVD, carotid bruits, or masses Cardiac: RRR; no murmurs, rubs, or gallops,no edema  Respiratory:  clear to auscultation bilaterally, normal work of breathing GI: soft, nontender, nondistended, + BS MS: no deformity or atrophy Skin: warm and dry, no rash Neuro:  Strength and sensation are intact Psych: euthymic mood, full affect   Recent Labs: 02/09/2018: TSH 0.709 03/08/2018: BUN 22; Creatinine, Ser 1.17; Hemoglobin 12.5; Platelets 243; Potassium 4.6; Sodium 140    Lipid Panel No results found for: CHOL, HDL, LDLCALC, TRIG    Wt Readings from Last 3 Encounters:  07/25/18 83 lb (37.6 kg)  03/08/18 82 lb 12 oz (37.5 kg)  02/05/18 87 lb 4.8 oz (39.6 kg)      ASSESSMENT AND PLAN:  Problem List Items Addressed This Visit      Cardiology Problems   Paroxysmal atrial fibrillation (HCC) - Primary   Relevant Orders   EKG 12-Lead     Other   Hip fracture (HCC)     Maintaining normal sinus rhythm We will continue her current medications She will monitor blood pressure at home, started to come down on recheck today No recent falls Discussed her low body weight, need for increased nutrition Family in the room, all questions answered  Disposition:   F/U  12 months   Total encounter time more than 25 minutes  Greater than 50% was spent in counseling and coordination of care with the patient    Signed, Dossie Arbourim Olaoluwa Grieder, M.D., Ph.D. [facility], [place] [phone]

## 2018-07-24 ENCOUNTER — Telehealth: Payer: Self-pay | Admitting: Cardiovascular Disease

## 2018-07-24 NOTE — Telephone Encounter (Signed)

## 2018-07-25 ENCOUNTER — Encounter: Payer: Self-pay | Admitting: Cardiovascular Disease

## 2018-07-25 ENCOUNTER — Ambulatory Visit (INDEPENDENT_AMBULATORY_CARE_PROVIDER_SITE_OTHER): Payer: Medicare Other | Admitting: Cardiovascular Disease

## 2018-07-25 ENCOUNTER — Other Ambulatory Visit: Payer: Self-pay

## 2018-07-25 VITALS — BP 148/76 | HR 65 | Ht 63.0 in | Wt 83.0 lb

## 2018-07-25 DIAGNOSIS — S72009S Fracture of unspecified part of neck of unspecified femur, sequela: Secondary | ICD-10-CM | POA: Diagnosis not present

## 2018-07-25 DIAGNOSIS — I480 Paroxysmal atrial fibrillation: Secondary | ICD-10-CM | POA: Diagnosis not present

## 2018-07-25 NOTE — Patient Instructions (Signed)

## 2018-08-14 ENCOUNTER — Telehealth: Payer: Self-pay | Admitting: Cardiovascular Disease

## 2018-08-14 NOTE — Telephone Encounter (Signed)
Called and spoke with patient's daughter, ok per DPR. Patient has been complaining of leg pain and not being able to sleep. Patient broke her hip earlier this year and was in the nursing home until recently. Patient is concerned her Eliquis and metoprolol are causing the issues because she's seen the side effects on TV. Advised that Eliquis dose cannot be changed as it is the recommended level. Daughter thinks the leg pain is from hip fracture and compensation when walking. Advised her to call patient's PCP for evaluation first to see if these symptoms are coming from something else rather than the medications.  She is agreeable with plan and will make those arrangements with PCP.

## 2018-08-14 NOTE — Telephone Encounter (Signed)
Patient daughter checking on status of advice

## 2018-08-14 NOTE — Telephone Encounter (Signed)
Patient daughter calling in regarding her mothers lack of sleep and leg pain. Patient is currently on Eliquis and Lopressor and is wondering if she can reduce them both by half.  Patients daughter states that her mother has also been watching eliquis commercials and paying close attention to the side effects

## 2018-11-22 ENCOUNTER — Other Ambulatory Visit: Payer: Self-pay | Admitting: Cardiovascular Disease

## 2019-01-13 ENCOUNTER — Other Ambulatory Visit: Payer: Self-pay | Admitting: Cardiovascular Disease

## 2019-01-15 NOTE — Telephone Encounter (Signed)
Pt's age 86, wt 37.6 kg, SCr 1.17, CrCl 17.45, last ov w/ Dr. Mariah Milling 07/25/18.

## 2019-07-11 ENCOUNTER — Other Ambulatory Visit: Payer: Self-pay | Admitting: Cardiovascular Disease

## 2019-07-11 NOTE — Telephone Encounter (Signed)
Attempted to schedule no ans no vm  

## 2019-07-11 NOTE — Telephone Encounter (Signed)
Please schedule 12 month F/U appointment with Dr. Gollan. Thank you! 

## 2019-07-21 ENCOUNTER — Other Ambulatory Visit: Payer: Self-pay | Admitting: Cardiovascular Disease

## 2019-07-23 NOTE — Telephone Encounter (Signed)
Refill Request.  

## 2019-08-13 ENCOUNTER — Other Ambulatory Visit: Payer: Self-pay | Admitting: Cardiovascular Disease

## 2019-08-23 ENCOUNTER — Other Ambulatory Visit: Payer: Self-pay | Admitting: Cardiovascular Disease

## 2019-08-23 NOTE — Progress Notes (Signed)
Cardiology Office Note  Date:  08/24/2019   ID:  Vanessa Brock, DOB 02-22-1922, MRN [id number]  PCP:  Patrice Paradise, MD   Chief Complaint  Patient presents with  . Other    12 month follow up. patient c/o being weak - wonders if its medication. Meds reviewed verbally with patient.     HPI:  86 year old female with a history of hypertension  GERD left femoral neck fracture status post ORIF, 02/2018 developed A. fib postoperatively,  who presents for follow-up of her atrial fibrillation  Last seen in clinic July 2020 Recovering from hip fracture at that time Was maintaining normal sinus rhythm  In follow-up today reports that she feels tired, wonders if she is on too much medication Thinks her blood pressure has been running low at home, would like to cut back Has bruising but denies any other complications from the Eliquis  Denies any tachycardia or palpitations concerning for arrhythmia Presents today with family  Weight today stable 84 pounds  Denies any recent falls  EKG personally reviewed by myself on todays visit Normal sinus rhythm rate 63 bpm right bundle branch block  Other past medical history reviewed February 05, 2018, was walking into the garage when her foot caught the lip of the entryway between the home in the garage, and she fell forward.    left femoral neck fracture.   She underwent ORIF of the left hip  Postoperatively, she did develop rate controlled atrial fibrillation.   Metoprolol and Eliquis were added to her regimen.  completely asymptomatic.    Echocardiogram was performed and showed normal LV function with impaired diastolic relaxation.  She was subsequently discharged to rehab.   PMH:   has a past medical history of AKI (acute kidney injury) (HCC), Closed displaced fracture of left femoral neck (HCC), Diastolic dysfunction, GERD (gastroesophageal reflux disease), Hypertension, Impaired fasting glucose, and Persistent atrial fibrillation  (HCC).  PSH:    Past Surgical History:  Procedure Laterality Date  . ABDOMINAL HYSTERECTOMY    . CHOLECYSTECTOMY    . HIP SURGERY  02/06/2018  . INTRAMEDULLARY (IM) NAIL INTERTROCHANTERIC Left 02/06/2018   Procedure: INTRAMEDULLARY (IM) NAIL INTERTROCHANTRIC;  Surgeon: Donato Heinz, MD;  Location: [facility] ORS;  Service: Orthopedics;  Laterality: Left;    Current Outpatient Medications  Medication Sig Dispense Refill  . apixaban (ELIQUIS) 2.5 MG TABS tablet Take 1 tablet (2.5 mg total) by mouth 2 (two) times daily. Need appt w/ Dr. Mariah Milling for further refills. 60 tablet 0  . Calcium Carb-Cholecalciferol (CALCIUM-VITAMIN D) 500-200 MG-UNIT tablet Take 1 tablet by mouth 2 (two) times daily.    Marland Kitchen diltiazem (TIAZAC) 360 MG 24 hr capsule Take 360 mg by mouth daily.    . metoprolol tartrate (LOPRESSOR) 25 MG tablet TAKE 1/2 OF A TABLET BY MOUTH TWICE A DAY --CALL OFFICE TO SCHEDULE APPT BEFORE RUN OUT OF TABS 30 tablet 0  . Multiple Vitamin (MULTIVITAMIN) capsule Take 1 capsule by mouth daily.    Marland Kitchen omeprazole (PRILOSEC) 20 MG capsule Take 20 mg by mouth daily.    Marland Kitchen oxyCODONE (OXY IR/ROXICODONE) 5 MG immediate release tablet Take 1 tablet (5 mg total) by mouth every 4 (four) hours as needed for severe pain (pain score 7-10). 30 tablet 0  . traMADol (ULTRAM) 50 MG tablet Take 1 tablet (50 mg total) by mouth every 4 (four) hours as needed for moderate pain (pain score 4-6). 30 tablet 1   No current facility-administered medications for this  visit.     Allergies:   Aspirin and Sulfur   Social History:  The patient  reports that she has never smoked. She has never used smokeless tobacco. She reports that she does not drink alcohol and does not use drugs.   Family History:   family history includes Heart disease in her mother; Other in her father and sister.    Review of Systems: Review of Systems  Constitutional: Negative.   HENT: Negative.   Respiratory: Negative.   Cardiovascular: Negative.    Gastrointestinal: Negative.   Musculoskeletal: Negative.   Neurological: Negative.   Psychiatric/Behavioral: Negative.   All other systems reviewed and are negative.   PHYSICAL EXAM: VS:  BP (!) 142/60 (BP Location: Left Arm, Patient Position: Sitting, Cuff Size: Normal)   Pulse 63   Ht 5\' 3"  (1.6 m)   Wt 84 lb (38.1 kg)   BMI 14.88 kg/m  , BMI Body mass index is 14.88 kg/m. Constitutional:  oriented to person, place, and time. No distress.  HENT:  Head: Grossly normal Eyes:  no discharge. No scleral icterus.  Neck: No JVD, no carotid bruits  Cardiovascular: Regular rate and rhythm, no murmurs appreciated Pulmonary/Chest: Clear to auscultation bilaterally, no wheezes or rails Abdominal: Soft.  no distension.  no tenderness.  Musculoskeletal: Normal range of motion Neurological:  normal muscle tone. Coordination normal. No atrophy Skin: Skin warm and dry Psychiatric: normal affect, pleasant   Recent Labs: No results found for requested labs within last 8760 hours.    Lipid Panel No results found for: CHOL, HDL, LDLCALC, TRIG    Wt Readings from Last 3 Encounters:  08/24/19 84 lb (38.1 kg)  07/25/18 83 lb (37.6 kg)  03/08/18 82 lb 12 oz (37.5 kg)      ASSESSMENT AND PLAN:  Problem List Items Addressed This Visit      Cardiology Problems   Paroxysmal atrial fibrillation (HCC) - Primary     Other   Hip fracture (HCC)    Other Visit Diagnoses    Essential hypertension         Paroxysmal atrial fibrillation Maintaining normal sinus rhythm She is concerned about low blood pressure, will decrease the diltiazem ER down to 300 daily Continue Eliquis 2.5 twice daily No recent tachycardia palpitations concerning for arrhythmia  Gait instability Recommended walking program for leg strengthening  Essential hypertension Decreased diltiazem ER as detailed above 300 daily   Total encounter time more than 25 minutes  Greater than 50% was spent in counseling  and coordination of care with the patient    Signed, 05/08/18, M.D., Ph.D. [facility] [place], [place]

## 2019-08-23 NOTE — Telephone Encounter (Signed)
Please review for refill, Thanks !  

## 2019-08-24 ENCOUNTER — Ambulatory Visit (INDEPENDENT_AMBULATORY_CARE_PROVIDER_SITE_OTHER): Payer: Medicare Other | Admitting: Cardiovascular Disease

## 2019-08-24 ENCOUNTER — Other Ambulatory Visit: Payer: Self-pay

## 2019-08-24 ENCOUNTER — Encounter: Payer: Self-pay | Admitting: Cardiovascular Disease

## 2019-08-24 VITALS — BP 142/60 | HR 63 | Ht 63.0 in | Wt 84.0 lb

## 2019-08-24 DIAGNOSIS — S72009S Fracture of unspecified part of neck of unspecified femur, sequela: Secondary | ICD-10-CM | POA: Diagnosis not present

## 2019-08-24 DIAGNOSIS — I10 Essential (primary) hypertension: Secondary | ICD-10-CM

## 2019-08-24 DIAGNOSIS — I480 Paroxysmal atrial fibrillation: Secondary | ICD-10-CM

## 2019-08-24 MED ORDER — DILTIAZEM HCL ER BEADS 300 MG PO CP24: 300.0000 mg | ORAL_CAPSULE | Freq: Every day | ORAL | 3 refills | Status: DC

## 2019-08-24 MED ORDER — APIXABAN 2.5 MG PO TABS: 2.5000 mg | ORAL_TABLET | Freq: Two times a day (BID) | ORAL | 11 refills | Status: DC

## 2019-08-24 NOTE — Patient Instructions (Signed)
Medication Instructions:  Please decrease the diltiazem ER down to 300 mg daily Stay on eliquis 2.5 twice a day  If you need a refill on your cardiac medications before your next appointment, please call your pharmacy.    Lab work: No new labs needed   If you have labs (blood work) drawn today and your tests are completely normal, you will receive your results only by: Marland Kitchen MyChart Message (if you have MyChart) OR . A paper copy in the mail If you have any lab test that is abnormal or we need to change your treatment, we will call you to review the results.   Testing/Procedures: No new testing needed   Follow-Up: At [facility], you and your health needs are our priority.  As part of our continuing mission to provide you with exceptional heart care, we have created designated Provider Care Teams.  These Care Teams include your primary Cardiologist (physician) and Advanced Practice Providers (APPs -  Physician Assistants and Nurse Practitioners) who all work together to provide you with the care you need, when you need it.  . You will need a follow up appointment in 6 months   . Providers on your designated Care Team:   . Nicolasa Ducking, NP . Eula Listen, PA-C . Marisue Ivan, PA-C  Any Other Special Instructions Will Be Listed Below (If Applicable).  COVID-19 Vaccine Information can be found at: [email or web address] For questions related to vaccine distribution or appointments, please email [email or web address] or call [phone].

## 2019-08-27 NOTE — Telephone Encounter (Signed)
Pt's age 86, wt 38.1 kg, SCr 1.3, CrCl 15.57, last ov w/ TG 08/24/19. Pt is currently on Eliquis 2.5 mg BID due to age (>80) & wt (<60kg). Per protocol, "contact MD for alternative anticoagulant if any of the following exist:  CrCl less than 72ml/min". Pt's CrCl is 15.57. Routing to Dr. Mariah Milling for his input.

## 2019-08-27 NOTE — Telephone Encounter (Signed)
You  Antonieta Iba, MD 2 hours ago (11:37 AM)     Pt's age 86, wt 38.1 kg, SCr 1.3, CrCl 15.57, last ov w/ TG 08/24/19. Pt is currently on Eliquis 2.5 mg BID due to age (>80) & wt (<60kg). Per protocol, "contact MD for alternative anticoagulant if any of the following exist:  CrCl less than 39ml/min". Pt's CrCl is 15.57. Routing to Dr. Mariah Milling for his input.

## 2019-08-29 NOTE — Telephone Encounter (Signed)
Okay to refill Eliquis 2.5 twice daily

## 2019-09-10 ENCOUNTER — Other Ambulatory Visit: Payer: Self-pay | Admitting: Cardiovascular Disease

## 2020-02-24 NOTE — Progress Notes (Signed)
Cardiology Office Note  Date:  02/25/2020   ID:  Vanessa Brock, DOB November 01, 1922, MRN [id number]  PCP:  Patrice Paradise, MD   Chief Complaint  Patient presents with  . Follow-up    6 month F/U    HPI:  87 year old female with a history of hypertension  GERD left femoral neck fracture status post ORIF, 02/2018 developed A. fib postoperatively,  who presents for follow-up of her atrial fibrillation  Last seen in clinic 08/2019 Denies any tachypalpitations, feels well Independent, lives alone, no falls Works in yard especially spring and summer Good balance she reports Presents with family today, they report she is doing well Likes to rake leaves, do other jobs in the garden  Denies chest pain shortness of breath palpitations no near syncope or syncope Weight stable  EKG personally reviewed by myself on todays visit Normal sinus rhythm rate 68 bpm right bundle branch block  Other past medical history reviewed February 05, 2018, was walking into the garage when her foot caught the lip of the entryway between the home in the garage, and she fell forward.    left femoral neck fracture.   She underwent ORIF of the left hip  Postoperatively, she did develop rate controlled atrial fibrillation.   Metoprolol and Eliquis were added to her regimen.  completely asymptomatic.    Echocardiogram was performed and showed normal LV function with impaired diastolic relaxation.  She was subsequently discharged to rehab.   PMH:   has a past medical history of AKI (acute kidney injury) (HCC), Closed displaced fracture of left femoral neck (HCC), Diastolic dysfunction, GERD (gastroesophageal reflux disease), Hypertension, Impaired fasting glucose, and Persistent atrial fibrillation (HCC).  PSH:    Past Surgical History:  Procedure Laterality Date  . ABDOMINAL HYSTERECTOMY    . CHOLECYSTECTOMY    . HIP SURGERY  02/06/2018  . INTRAMEDULLARY (IM) NAIL INTERTROCHANTERIC Left 02/06/2018    Procedure: INTRAMEDULLARY (IM) NAIL INTERTROCHANTRIC;  Surgeon: Donato Heinz, MD;  Location: [facility] ORS;  Service: Orthopedics;  Laterality: Left;    Current Outpatient Medications  Medication Sig Dispense Refill  . apixaban (ELIQUIS) 2.5 MG TABS tablet Take 1 tablet (2.5 mg total) by mouth 2 (two) times daily. 60 tablet 6  . Calcium Carb-Cholecalciferol (CALCIUM-VITAMIN D) 500-200 MG-UNIT tablet Take 1 tablet by mouth 2 (two) times daily.    Marland Kitchen diltiazem (TIAZAC) 300 MG 24 hr capsule Take 1 capsule (300 mg total) by mouth daily. 90 capsule 3  . metoprolol tartrate (LOPRESSOR) 25 MG tablet Take 0.5 tablets (12.5 mg total) by mouth 2 (two) times daily. 30 tablet 5  . Multiple Vitamin (MULTIVITAMIN) capsule Take 1 capsule by mouth daily.    Marland Kitchen omeprazole (PRILOSEC) 20 MG capsule Take 20 mg by mouth daily.    Marland Kitchen oxyCODONE (OXY IR/ROXICODONE) 5 MG immediate release tablet Take 1 tablet (5 mg total) by mouth every 4 (four) hours as needed for severe pain (pain score 7-10). 30 tablet 0  . traMADol (ULTRAM) 50 MG tablet Take 1 tablet (50 mg total) by mouth every 4 (four) hours as needed for moderate pain (pain score 4-6). 30 tablet 1   No current facility-administered medications for this visit.     Allergies:   Aspirin and Elemental sulfur   Social History:  The patient  reports that she has never smoked. She has never used smokeless tobacco. She reports that she does not drink alcohol and does not use drugs.   Family History:  family history includes Heart disease in her mother; Other in her father and sister.    Review of Systems: Review of Systems  Constitutional: Negative.   HENT: Negative.   Respiratory: Negative.   Cardiovascular: Negative.   Gastrointestinal: Negative.   Musculoskeletal: Negative.   Neurological: Negative.   Psychiatric/Behavioral: Negative.   All other systems reviewed and are negative.   PHYSICAL EXAM: VS:  BP 134/64 (BP Location: Left Arm, Patient Position:  Sitting, Cuff Size: Normal)   Pulse 68   Ht 5\' 3"  (1.6 m)   Wt 86 lb (39 kg)   SpO2 97%   BMI 15.23 kg/m  , BMI Body mass index is 15.23 kg/m. Constitutional:  oriented to person, place, and time. No distress.  Thin HENT:  Head: Grossly normal Eyes:  no discharge. No scleral icterus.  Neck: No JVD, no carotid bruits  Cardiovascular: Regular rate and rhythm, no murmurs appreciated Pulmonary/Chest: Clear to auscultation bilaterally, no wheezes or rails Abdominal: Soft.  no distension.  no tenderness.  Musculoskeletal: Normal range of motion Neurological:  normal muscle tone. Coordination normal. No atrophy Skin: Skin warm and dry Psychiatric: normal affect, pleasant   Recent Labs: No results found for requested labs within last 8760 hours.    Lipid Panel No results found for: CHOL, HDL, LDLCALC, TRIG    Wt Readings from Last 3 Encounters:  02/25/20 86 lb (39 kg)  08/24/19 84 lb (38.1 kg)  07/25/18 83 lb (37.6 kg)      ASSESSMENT AND PLAN:  Problem List Items Addressed This Visit      Cardiology Problems   Paroxysmal atrial fibrillation (HCC) - Primary    Other Visit Diagnoses    Essential hypertension         Paroxysmal atrial fibrillation Normal sinus rhythm, denies any tachypalpitations Continue current medications including diltiazem extended release and Eliquis Eliquis 2.5 twice daily No falls  Gait instability Active at baseline no falls   Essential hypertension Blood pressure is well controlled on today's visit. No changes made to the medications.    Total encounter time more than 25 minutes  Greater than 50% was spent in counseling and coordination of care with the patient    Signed, 07/27/18, M.D., Ph.D. [facility] [place], [place]

## 2020-02-25 ENCOUNTER — Encounter: Payer: Self-pay | Admitting: Cardiovascular Disease

## 2020-02-25 ENCOUNTER — Ambulatory Visit (INDEPENDENT_AMBULATORY_CARE_PROVIDER_SITE_OTHER): Payer: Medicare Other | Admitting: Cardiovascular Disease

## 2020-02-25 ENCOUNTER — Other Ambulatory Visit: Payer: Self-pay

## 2020-02-25 VITALS — BP 134/64 | HR 68 | Ht 63.0 in | Wt 86.0 lb

## 2020-02-25 DIAGNOSIS — I10 Essential (primary) hypertension: Secondary | ICD-10-CM

## 2020-02-25 DIAGNOSIS — I480 Paroxysmal atrial fibrillation: Secondary | ICD-10-CM

## 2020-02-25 NOTE — Patient Instructions (Signed)
Medication Instructions:  No changes  If you need a refill on your cardiac medications before your next appointment, please call your pharmacy.    Lab work: No new labs needed   If you have labs (blood work) drawn today and your tests are completely normal, you will receive your results only by: . MyChart Message (if you have MyChart) OR . A paper copy in the mail If you have any lab test that is abnormal or we need to change your treatment, we will call you to review the results.   Testing/Procedures: No new testing needed   Follow-Up: At [facility], you and your health needs are our priority.  As part of our continuing mission to provide you with exceptional heart care, we have created designated Provider Care Teams.  These Care Teams include your primary Cardiologist (physician) and Advanced Practice Providers (APPs -  Physician Assistants and Nurse Practitioners) who all work together to provide you with the care you need, when you need it.  . You will need a follow up appointment in 6 months  . Providers on your designated Care Team:   . Christopher Berge, NP . Ryan Dunn, PA-C . Jacquelyn Visser, PA-C  Any Other Special Instructions Will Be Listed Below (If Applicable).  COVID-19 Vaccine Information can be found at: [email or web address] For questions related to vaccine distribution or appointments, please email [email or web address] or call [phone].     

## 2020-06-17 ENCOUNTER — Emergency Department
Admission: EM | Admit: 2020-06-17 | Discharge: 2020-06-17 | Disposition: A | Discharge status: Other Acute Inpatient Hospital | Payer: Medicare Other | Attending: Emergency Medicine | Admitting: Emergency Medicine

## 2020-06-17 ENCOUNTER — Emergency Department: Payer: Medicare Other

## 2020-06-17 ENCOUNTER — Other Ambulatory Visit: Payer: Self-pay

## 2020-06-17 DIAGNOSIS — Z7901 Long term (current) use of anticoagulants: Secondary | ICD-10-CM | POA: Insufficient documentation

## 2020-06-17 DIAGNOSIS — Z79899 Other long term (current) drug therapy: Secondary | ICD-10-CM | POA: Diagnosis not present

## 2020-06-17 DIAGNOSIS — W19XXXA Unspecified fall, initial encounter: Secondary | ICD-10-CM

## 2020-06-17 DIAGNOSIS — I110 Hypertensive heart disease with heart failure: Secondary | ICD-10-CM | POA: Insufficient documentation

## 2020-06-17 DIAGNOSIS — S3993XA Unspecified injury of pelvis, initial encounter: Secondary | ICD-10-CM | POA: Diagnosis present

## 2020-06-17 DIAGNOSIS — S32811A Multiple fractures of pelvis with unstable disruption of pelvic ring, initial encounter for closed fracture: Secondary | ICD-10-CM | POA: Diagnosis not present

## 2020-06-17 DIAGNOSIS — W06XXXA Fall from bed, initial encounter: Secondary | ICD-10-CM | POA: Insufficient documentation

## 2020-06-17 DIAGNOSIS — I5030 Unspecified diastolic (congestive) heart failure: Secondary | ICD-10-CM | POA: Diagnosis not present

## 2020-06-17 DIAGNOSIS — M25552 Pain in left hip: Secondary | ICD-10-CM | POA: Insufficient documentation

## 2020-06-17 DIAGNOSIS — Z20822 Contact with and (suspected) exposure to covid-19: Secondary | ICD-10-CM | POA: Insufficient documentation

## 2020-06-17 DIAGNOSIS — I480 Paroxysmal atrial fibrillation: Secondary | ICD-10-CM | POA: Diagnosis not present

## 2020-06-17 LAB — CBC
HCT: 36.8 % (ref 36.0–46.0)
Hemoglobin: 12.9 g/dL (ref 12.0–15.0)
MCH: 33.2 pg (ref 26.0–34.0)
MCHC: 35.1 g/dL (ref 30.0–36.0)
MCV: 94.6 fL (ref 80.0–100.0)
Platelets: 226 10*3/uL (ref 150–400)
RBC: 3.89 MIL/uL (ref 3.87–5.11)
RDW: 13.2 % (ref 11.5–15.5)
WBC: 20 10*3/uL — ABNORMAL HIGH (ref 4.0–10.5)
nRBC: 0 % (ref 0.0–0.2)

## 2020-06-17 LAB — RESP PANEL BY RT-PCR (FLU A&B, COVID) ARPGX2
Influenza A by PCR: NEGATIVE
Influenza B by PCR: NEGATIVE
SARS Coronavirus 2 by RT PCR: NEGATIVE

## 2020-06-17 LAB — PROTIME-INR
INR: 1.2 (ref 0.8–1.2)
Prothrombin Time: 15.2 seconds (ref 11.4–15.2)

## 2020-06-17 LAB — BASIC METABOLIC PANEL
Anion gap: 9 (ref 5–15)
BUN: 30 mg/dL — ABNORMAL HIGH (ref 8–23)
CO2: 25 mmol/L (ref 22–32)
Calcium: 9.1 mg/dL (ref 8.9–10.3)
Chloride: 103 mmol/L (ref 98–111)
Creatinine, Ser: 1.31 mg/dL — ABNORMAL HIGH (ref 0.44–1.00)
GFR, Estimated: 37 mL/min — ABNORMAL LOW (ref >60)
Glucose, Bld: 134 mg/dL — ABNORMAL HIGH (ref 70–99)
Potassium: 3.7 mmol/L (ref 3.5–5.1)
Sodium: 137 mmol/L (ref 135–145)

## 2020-06-17 LAB — TYPE AND SCREEN
ABO/RH(D): O POS
Antibody Screen: NEGATIVE

## 2020-06-17 MED ORDER — FENTANYL CITRATE (PF) 100 MCG/2ML IJ SOLN
25.0000 ug | Freq: Once | INTRAMUSCULAR | Status: AC
  Administered 2020-06-17: 25 ug via INTRAVENOUS
  Filled 2020-06-17: qty 2

## 2020-06-17 MED ORDER — ONDANSETRON HCL 4 MG/2ML IJ SOLN
4.0000 mg | Freq: Once | INTRAMUSCULAR | Status: AC
  Administered 2020-06-17: 4 mg via INTRAVENOUS
  Filled 2020-06-17: qty 2

## 2020-06-17 MED ORDER — IOHEXOL 300 MG/ML  SOLN
75.0000 mL | Freq: Once | INTRAMUSCULAR | Status: AC | PRN
  Administered 2020-06-17: 75 mL via INTRAVENOUS

## 2020-06-17 NOTE — ED Triage Notes (Signed)
EMS brought in from home for fall about an hour pta. Pt reports rolling out of bed while sleeping, landing on her left buttock. Denies head injury. Takes Eliquis. Complaining of left hip/pelvic pain with movement. No obvious deformity noted to same. Took 2 Tylenol PM at 2130. AAOx4.

## 2020-06-17 NOTE — ED Notes (Signed)
Transfer consent signed on printed copy and placed into medical records file bin.

## 2020-06-17 NOTE — ED Provider Notes (Signed)
Vanessa Brock  ____________________________________________  Time seen: Approximately 12:54 AM  I have reviewed the triage vital signs and the nursing notes.   HISTORY  Chief Complaint Fall   HPI EDIT Vanessa Brock is a 87 y.o. female with a history of left hip fracture, diastolic CHF, hypertension, A. fib on Eliquis who presents after a mechanical fall.  Patient lives independently at home.  She reports that she got up to go to the bathroom.  She came back and laid in bed and was trying to get the sheet untangled when she rolled out of bed. She fell onto her L hip and is complaining of L posterior hip pain. She denies hitting her head, no LOC, no back pain, no neck pain, no HA, no CP, no abdominal pain or shortness of breath.  Her pain is sharp and constant, worse with movement of the leg.  She took 2 Tylenol p.m. before bedtime.  Past Medical History:  Diagnosis Date   AKI (acute kidney injury) (HCC)    a. 02/2018 in setting of hip fx -->improved w/ IVF.   Closed displaced fracture of left femoral neck (HCC)    a. 02/2018 s/p ORIF.   Diastolic dysfunction    a. 02/2018 Limited Echo: EF > 65%, impaired diast relaxation. Nl RV fxn.    GERD (gastroesophageal reflux disease)    Hypertension    Impaired fasting glucose    Persistent atrial fibrillation (HCC)    a. 02/2018 Dx following ORIF L hip 2/2 fx; b. CHA2DS2VASc = 4-->Eliquis.    Patient Active Problem List   Diagnosis Date Noted   Paroxysmal atrial fibrillation (HCC) 07/21/2018   Hip fracture (HCC) 02/05/2018    Past Surgical History:  Procedure Laterality Date   ABDOMINAL HYSTERECTOMY     CHOLECYSTECTOMY     HIP SURGERY  02/06/2018   INTRAMEDULLARY (IM) NAIL INTERTROCHANTERIC Left 02/06/2018   Procedure: INTRAMEDULLARY (IM) NAIL INTERTROCHANTRIC;  Surgeon: Donato Heinz, MD;  Location: [facility] ORS;  Service: Orthopedics;  Laterality: Left;    Prior to Admission  medications   Medication Sig Start Date End Date Taking? Authorizing Provider  apixaban (ELIQUIS) 2.5 MG TABS tablet Take 1 tablet (2.5 mg total) by mouth 2 (two) times daily. 08/30/19   Antonieta Iba, MD  Calcium Carb-Cholecalciferol (CALCIUM-VITAMIN D) 500-200 MG-UNIT tablet Take 1 tablet by mouth 2 (two) times daily.    [provider]  diltiazem (TIAZAC) 300 MG 24 hr capsule Take 1 capsule (300 mg total) by mouth daily. 08/24/19   Antonieta Iba, MD  metoprolol tartrate (LOPRESSOR) 25 MG tablet Take 0.5 tablets (12.5 mg total) by mouth 2 (two) times daily. 09/11/19   Antonieta Iba, MD  Multiple Vitamin (MULTIVITAMIN) capsule Take 1 capsule by mouth daily.    [provider]  omeprazole (PRILOSEC) 20 MG capsule Take 20 mg by mouth daily. 01/15/18   [provider]  omeprazole (PRILOSEC) 20 MG capsule Take 20 mg by mouth daily. 06/09/20   [provider]  oxyCODONE (OXY IR/ROXICODONE) 5 MG immediate release tablet Take 1 tablet (5 mg total) by mouth every 4 (four) hours as needed for severe pain (pain score 7-10). 02/09/18   Dedra Skeens, PA-C  TIADYLT ER 360 MG 24 hr capsule Take 360 mg by mouth daily as needed. 06/09/20   [provider]  traMADol (ULTRAM) 50 MG tablet Take 1 tablet (50 mg total) by mouth every 4 (four) hours as  needed for moderate pain (pain score 4-6). 02/09/18   Dedra Skeens, PA-C    Allergies Aspirin and Elemental sulfur  Family History  Problem Relation Age of Onset   Heart disease Mother        died @ 71 - ? heart issues   Other Father        died @ 77   Other Sister        42    Social History Social History   Tobacco Use   Smoking status: Never   Smokeless tobacco: Never  Substance Use Topics   Alcohol use: No   Drug use: No    Review of Systems  Constitutional: Negative for fever. Eyes: Negative for visual changes. ENT: Negative for facial injury or neck injury Cardiovascular: Negative for chest  injury. Respiratory: Negative for shortness of breath. Negative for chest wall injury. Gastrointestinal: Negative for abdominal pain or injury. Genitourinary: Negative for dysuria. Musculoskeletal: Negative for back injury, + L hip pain. Skin: Negative for laceration/abrasions. Neurological: Negative for head injury.   ____________________________________________   PHYSICAL EXAM:  VITAL SIGNS: ED Triage Vitals  Enc Vitals Group     BP 06/17/20 0028 (!) 168/78     Pulse Rate 06/17/20 0028 83     Resp 06/17/20 0028 18     Temp 06/17/20 0028 97.8 F (36.6 C)     Temp Source 06/17/20 0028 Oral     SpO2 06/17/20 0027 97 %     Weight 06/17/20 0028 85 lb (38.6 kg)     Height 06/17/20 0028 5\' 3"  (1.6 m)     Head Circumference --      Peak Flow --      Pain Score --      Pain Loc --      Pain Edu? --      Excl. in GC? --     Full spinal precautions maintained throughout the trauma exam. Constitutional: Alert and oriented. No acute distress. Does not appear intoxicated. HEENT Head: Normocephalic and atraumatic. Face: No facial bony tenderness. Stable midface Ears: No hemotympanum bilaterally. No Battle sign Eyes: No eye injury. PERRL. No raccoon eyes Nose: Nontender. No epistaxis. No rhinorrhea Mouth/Throat: Mucous membranes are moist. No oropharyngeal blood. No dental injury. Airway patent without stridor. Normal voice. Neck: no C-collar. No midline c-spine tenderness.  Cardiovascular: Normal rate, regular rhythm. Normal and symmetric distal pulses are present in all extremities. Pulmonary/Chest: Chest wall is stable and nontender to palpation/compression. Normal respiratory effort. Breath sounds are normal. No crepitus.  Abdominal: Soft, nontender, non distended. Musculoskeletal: Tender to palpation over the L proximal femur area with no obvious deformities. Nontender with normal full range of motion in all other extremities. No deformities. No thoracic or lumbar midline spinal  tenderness. Pelvis is stable. Skin: Skin is warm, dry and intact. No abrasions or contutions. Psychiatric: Speech and behavior are appropriate. Neurological: Normal speech and language. Moves all extremities to command. No gross focal neurologic deficits are appreciated.  Glascow Coma Score: 4 - Opens eyes on own 6 - Follows simple motor commands 5 - Alert and oriented GCS: 15   ____________________________________________   LABS (all labs ordered are listed, but only abnormal results are displayed)  Labs Reviewed  CBC - Abnormal; Notable for the following components:      Result Value   WBC 20.0 (*)    All other components within normal limits  BASIC METABOLIC PANEL - Abnormal; Notable for the following components:  Glucose, Bld 134 (*)    BUN 30 (*)    Creatinine, Ser 1.31 (*)    GFR, Estimated 37 (*)    All other components within normal limits  RESP PANEL BY RT-PCR (FLU A&B, COVID) ARPGX2  PROTIME-INR  URINALYSIS, COMPLETE (UACMP) WITH MICROSCOPIC  TYPE AND SCREEN   ____________________________________________  EKG  ED ECG REPORT I, Nita Sickle, the attending physician, personally viewed and interpreted this ECG.  Sinus rhythm, rate of 82, first-degree AV block, RBBB, L PFB, no ST elevations or depressions.  Unchanged from prior ____________________________________________  RADIOLOGY  I have personally reviewed the images performed during this visit and I agree with the Radiologist's read.   Interpretation by Radiologist:  CT ABDOMEN PELVIS W CONTRAST  Addendum Date: 06/17/2020   ADDENDUM REPORT: 06/17/2020 04:35 ADDENDUM: Study discussed by telephone with Dr. Nita Sickle on 06/17/2020 at 0423 hours. She advised the patient was hemodynamically stable at that time. Electronically Signed   By: Odessa Fleming M.D.   On: 06/17/2020 04:35   Result Date: 06/17/2020 CLINICAL DATA:  87 year old female status post fall out of bed landing on left buttock. Pain.  EXAM: CT ABDOMEN AND PELVIS WITH CONTRAST TECHNIQUE: Multidetector CT imaging of the abdomen and pelvis was performed using the standard protocol following bolus administration of intravenous contrast. CONTRAST:  75mL OMNIPAQUE IOHEXOL 300 MG/ML  SOLN COMPARISON:  CT Abdomen 07/29/2015 and CT Abdomen and Pelvis 07/19/2010 FINDINGS: Lower chest: Stable pectus excavatum. Cardiac size is at the upper limits of normal. Mild tortuosity and mild to moderate atherosclerosis of the descending thoracic aorta. No pericardial or pleural effusion. Respiratory motion, but lung bases appear clear. Hepatobiliary: Chronically absent gallbladder. Stable liver with chronic bile duct enlargement. Pancreas: Negative. Spleen: Stable, negative. Adrenals/Urinary Tract: Adrenal glands are stable and within normal limits. Kidneys are nonobstructed, with chronic bilateral renal cysts and left midpole nephrolithiasis. Renal enhancement and contrast excretion is symmetric and within normal limits. Normal proximal ureters. The urinary bladder is distended in the pelvis (380 mL) but otherwise unremarkable. Stomach/Bowel: No dilated large or small bowel. Diverticulosis of the sigmoid colon. No bowel inflammation identified. No free air or free fluid identified. Vascular/Lymphatic: Aortoiliac calcified atherosclerosis. Major arterial structures in the abdomen and pelvis remain patent. There is some active contrast extravasation into the proximal left hip flexor muscles as seen on series 2, image 71. Portal venous system is patent. Reproductive: Absent uterus. 18 mm simple fluid density right ovarian cyst on series 2, image 48 appears inconsequential. Other: Positive for left pelvic sidewall and proximal left hip flexor intramuscular hematoma (series 2, images 67 and 69). No pelvic free fluid. Musculoskeletal: Diffuse osteopenia. Motion artifact in the lower chest. No acute lower rib fracture identified. Lower thoracic and lumbar vertebrae appear  stable since 2017. Capacious spinal canal. Minimally displaced left sacral ala fracture most apparent on series 5, image 54. SI joints remain symmetric. Oblique mildly comminuted and displaced left inferior pubic ramus fracture (series 2, image 74). Comminuted and mildly displaced left superior pubic ramus fracture (series 5, image 25). Pubic symphysis remains aligned. No acetabular or iliac wing fracture identified. Previous left femur ORIF. No acute femur fracture identified. There is active contrast extravasation within intramuscular hematoma of the proximal anterior left hip flexors as seen on series 2 images 69 and 71. Intramuscular hematoma continues medial to the proximal left femur on image 84, with no active extravasation there. Additional left pelvic sidewall hematoma on image 67. No superficial hematoma identified. IMPRESSION: 1.  Positive for multiple acute left pelvic fractures (#2) with associated pelvic sidewall and hip flexor intramuscular hematoma- including a small area of ACTIVE EXTRAVASATION of contrast on series 2, image 71. 2. Comminuted and mildly displaced left superior and inferior pubic ramus fractures. Comminuted minimally displaced left sacral ala fracture. 3. No acute traumatic injury identified in the abdomen or lower chest. 4. Distended urinary bladder (380 mL). 5. Aortic Atherosclerosis (ICD10-I70.0). Electronically Signed: By: Odessa FlemingH  Hall M.D. On: 06/17/2020 04:20   DG Hip Unilat W or Wo Pelvis 2-3 Views Left  Result Date: 06/17/2020 CLINICAL DATA:  Fall EXAM: DG HIP (WITH OR WITHOUT PELVIS) 2-3V LEFT COMPARISON:  None. FINDINGS: Fractures through the left pubic bone, superior and inferior pubic rami. Hardware in the proximal left femur for from remote injury. No acute femoral fracture. Early spurring in the hip joints bilaterally. IMPRESSION: Fractures through the left pubic bone, superior and inferior pubic rami. Electronically Signed   By: Charlett NoseKevin  Dover M.D.   On: 06/17/2020 01:02     ____________________________________________   PROCEDURES  Procedure(s) performed:yes .1-3 Lead EKG Interpretation  Date/Time: 06/17/2020 5:05 AM Performed by: Nita SickleVeronese, [place], MD Authorized by: Nita SickleVeronese, [place], MD     Interpretation: non-specific     ECG rate assessment: normal     Rhythm: sinus rhythm     Ectopy: PVCs     Conduction: abnormal     Critical Care performed: yes  CRITICAL CARE Performed by: Nita Sicklearolina Dara Beidleman  ?  Total critical care time: 30 min  Critical care time was exclusive of separately billable procedures and treating other patients.  Critical care was necessary to treat or prevent imminent or life-threatening deterioration.  Critical care was time spent personally by me on the following activities: development of treatment plan with patient and/or surrogate as well as nursing, discussions with consultants, evaluation of patient's response to treatment, examination of patient, obtaining history from patient or surrogate, ordering and performing treatments and interventions, ordering and review of laboratory studies, ordering and review of radiographic studies, pulse oximetry and re-evaluation of patient's condition.  ____________________________________________   INITIAL IMPRESSION / ASSESSMENT AND PLAN / ED COURSE  87 y.o. female with a history of left hip fracture, diastolic CHF, hypertension, A. fib on Eliquis who presents after a mechanical fall.  Patient rolled out of bed and fell onto her left hip.  She is complaining of pain on the left hip.  Denies any head trauma or LOC.  Patient lives alone and is independent.  She is tender to palpation over the left pelvis/proximal femur region with no obvious deformity or bruising.  There is no midline CT no spine tenderness.  Abdomen soft and nontender.  Head atraumatic.  We will send patient for an x-ray of her pelvis and hip.   _________________________ 1:22 AM on  06/17/2020 ----------------------------------------- X-ray consistent with several pelvic fractures.  Since patient is on Eliquis we will send patient for CT with contrast to rule out active bleeding.  Patient remains hemodynamically stable.  Will order blood work.  _________________________ 2:43 AM on 06/17/2020 ----------------------------------------- Labs show stable hemoglobin at 12.9.  White count of 20.  Creatinine of 1.31.  CT pending.  Patient remains hemodynamically stable.  Pain improved after 25 mics of fentanyl  _________________________ 4:43 AM on 06/17/2020 ----------------------------------------- CT concerning intramuscular active contrast extravasation in the setting of several pelvic fractures.  Pelvic binder was applied.  Patient remains hemodynamically stable.  Current BP of 175/74.  Hemoglobin of 12.9.  Discussed with  Dr. Truman Hayward from [facility] who accepted patient as a trauma transfer. Patient and daughter informed of need of transfer.       ____________________________________________  Please Brock:  Patient was evaluated in Emergency Department today for the symptoms described in the history of present illness. Patient was evaluated in the context of the global COVID-19 pandemic, which necessitated consideration that the patient might be at risk for infection with the SARS-CoV-2 virus that causes COVID-19. Institutional protocols and algorithms that pertain to the evaluation of patients at risk for COVID-19 are in a state of rapid change based on information released by regulatory bodies including the CDC and federal and state organizations. These policies and algorithms were followed during the patient's care in the ED.  Some ED evaluations and interventions may be delayed as a result of limited staffing during the pandemic.   ____________________________________________   FINAL CLINICAL IMPRESSION(S) / ED DIAGNOSES   Final diagnoses:  Fall, initial encounter   Multiple closed fractures of pelvis with unstable disruption of pelvic ring, initial encounter (HCC)      NEW MEDICATIONS STARTED DURING THIS VISIT:  ED Discharge Orders     None        Brock:  This document was prepared using Dragon voice recognition software and may include unintentional dictation errors.     Don Perking, [place], MD 06/17/20 [phone]

## 2020-06-17 NOTE — ED Notes (Signed)
Pt taken to CT.

## 2020-06-25 ENCOUNTER — Encounter: Payer: Self-pay | Admitting: Internal Medicine

## 2020-06-25 ENCOUNTER — Non-Acute Institutional Stay (SKILLED_NURSING_FACILITY): Payer: Medicare Other | Admitting: Internal Medicine

## 2020-06-25 DIAGNOSIS — W19XXXD Unspecified fall, subsequent encounter: Secondary | ICD-10-CM | POA: Diagnosis not present

## 2020-06-25 DIAGNOSIS — N1832 Chronic kidney disease, stage 3b: Secondary | ICD-10-CM

## 2020-06-25 DIAGNOSIS — D72829 Elevated white blood cell count, unspecified: Secondary | ICD-10-CM

## 2020-06-25 DIAGNOSIS — I480 Paroxysmal atrial fibrillation: Secondary | ICD-10-CM

## 2020-06-25 DIAGNOSIS — W19XXXA Unspecified fall, initial encounter: Secondary | ICD-10-CM | POA: Insufficient documentation

## 2020-06-25 DIAGNOSIS — N189 Chronic kidney disease, unspecified: Secondary | ICD-10-CM | POA: Insufficient documentation

## 2020-06-25 NOTE — Assessment & Plan Note (Addendum)
06/25/2020 clinically she exhibits A. fib with slight tachycardia.  Eliquis will be reinitiated at low doses because she is essentially immobile in bed. PT/OT supervision w ambulation.

## 2020-06-25 NOTE — Patient Instructions (Signed)
See assessment and plan under each diagnosis in the problem list and acutely for this visit 

## 2020-06-25 NOTE — Assessment & Plan Note (Signed)
PT/OT at SNF with weightbearing under supervision only.

## 2020-06-25 NOTE — Assessment & Plan Note (Addendum)
06/25/2020 she denies any infectious symptoms.  CBC and differential will be repeated in the morning to establish current WBC & H/H  as the Eliquis will be restarted.

## 2020-06-25 NOTE — Progress Notes (Signed)
NURSING HOME LOCATION:  [facility] ROOM NUMBER:  [id number]  CODE STATUS:  DNR  PCP:  Maurine Minister MD  This is a comprehensive admission note to this SNFperformed on this date less than 30 days from date of admission. Included are preadmission medical/surgical history; reconciled medication list; family history; social history and comprehensive review of systems.  Corrections and additions to the records were documented. Comprehensive physical exam was also performed. Additionally a clinical summary was entered for each active diagnosis pertinent to this admission in the Problem List to enhance continuity of care.  HPI: Patient was hospitalized 6/14 - 06/24/2020 @ [facility] presenting as a yellow trauma after a mechanical fall from standing.  She sustained left pelvic sidewall and proximal left hip flexor intramuscular hematoma with a minimally displaced left sacral ala fracture.  An oblique mildly comminuted and displaced left inferior pubic ramus fracture was present along with a comminuted and mildly displaced left superior pubic ramus fracture. Orthopedics consulted and recommended weightbearing with PT/OT supervision.  Surgical intervention was not pursued. She was on Eliquis for A. fib which was held.  Heparin 5000 units every 12 hours was initiated for DVT prophylaxis on hospital day 2.  Diltiazem and metoprolol which were initially held were restarted on the second hospital day as well.  At discharge Eliquis was held until she could be evaluated by her PCP/Cardiologist because of the risk of bleeding in the context of her instability with increased risk of falling. Imaging revealed no cervical spine, thoracic, or lumbar spine injuries.  Pain was adequately controlled with p.o. medications. Trauma surgery team saw the patient the day of discharge.  Labs and vital signs were stable and reported as within normal limits.  Wounds were examined and it was felt that the  erythema was improving clinically.  She was discharged to the SNF for PT/OT.  Past medical and surgical history: Includes a history of AKI, diastolic dysfunction, GERD, essential hypertension, and persistent A. fib.  Surgeries and procedures include abdominal hysterectomy, cholecystectomy, and left IM intertrochanteric nailing.  Social history: Nondrinker; never smoked.  Family history: Noncontributory due to advanced age.   Review of systems: She denies any neurologic or cardiac prodrome prior to the fall.  She states that she had come from the bathroom and simply slipped off the bed landing on her left hip.  She continues to have back pain with position change which keeps her essentially immobile in the bed.  There is some radicular character to the pain down the back of the legs.  She denies any stool or urinary incontinence. She denies any bleeding dyscrasias.  She also denies any symptoms or signs of infection.  Constitutional: No fever, significant weight change, fatigue  Eyes: No redness, discharge, pain, vision change ENT/mouth: No nasal congestion, purulent discharge, earache, change in hearing, sore throat  Cardiovascular: No chest pain, palpitations, paroxysmal nocturnal dyspnea, claudication, edema  Respiratory: No cough, sputum production, hemoptysis, DOE, significant snoring, apnea Gastrointestinal: No heartburn, dysphagia, abdominal pain, nausea /vomiting, rectal bleeding, melena, change in bowels Genitourinary: No dysuria, hematuria, pyuria, incontinence, nocturia Dermatologic: No rash, pruritus, change in appearance of skin Neurologic: No dizziness, headache, syncope, seizures, numbness, tingling Psychiatric: No significant anxiety, depression, insomnia, anorexia Endocrine: No change in hair/skin/nails, excessive thirst, excessive hunger, excessive urination  Hematologic/lymphatic: No significant bruising, lymphadenopathy, abnormal bleeding Allergy/immunology: No itchy/watery  eyes, significant sneezing, urticaria, angioedema  Physical exam:  Pertinent or positive findings: She is thin; she appears younger than  her stated age.  She is hard of hearing.  She exhibits a slight tachyarrhythmia.  An aortic bruit is present.  I cannot appreciate an aortic aneurysm.  The pedal pulses are surprisingly strong.  The limbs are thin and atrophic.  She also has interosseous wasting.  Strength to opposition was not tested in the legs because of the fractures.  General appearance: no acute distress, increased work of breathing is present.   Lymphatic: No lymphadenopathy about the head, neck, axilla. Eyes: No conjunctival inflammation or lid edema is present. There is no scleral icterus. Ears:  External ear exam shows no significant lesions or deformities.   Nose:  External nasal examination shows no deformity or inflammation. Nasal mucosa are pink and moist without lesions, exudates Oral exam: Lips and gums are healthy appearing.There is no oropharyngeal erythema or exudate. Neck:  No thyromegaly, masses, tenderness noted.    Heart:  No murmur, click, rub.  Lungs: Chest clear to auscultation without wheezes, rhonchi, rales, rubs. Abdomen: Bowel sounds are normal.  Abdomen is soft and nontender with no organomegaly, hernias, masses. GU: Deferred  Extremities:  No cyanosis, clubbing, edema. Neurologic exam: Balance, Rhomberg, finger to nose testing could not be completed due to clinical state Skin: Warm & dry w/o tenting. No significant lesions or rash.  See clinical summary under each active problem in the Problem List with associated updated therapeutic plan

## 2020-06-25 NOTE — Assessment & Plan Note (Addendum)
Medication List reviewed; no nephrotoxic agents identified. BMET will be rechecked in the morning as it was last checked at admission 6/14.

## 2020-07-02 ENCOUNTER — Encounter: Payer: Self-pay | Admitting: Internal Medicine

## 2020-07-02 DIAGNOSIS — D539 Nutritional anemia, unspecified: Secondary | ICD-10-CM | POA: Insufficient documentation

## 2020-07-08 ENCOUNTER — Encounter: Payer: Self-pay | Admitting: Adult Health

## 2020-07-08 ENCOUNTER — Non-Acute Institutional Stay (SKILLED_NURSING_FACILITY): Payer: Medicare Other | Admitting: Adult Health

## 2020-07-08 DIAGNOSIS — I480 Paroxysmal atrial fibrillation: Secondary | ICD-10-CM

## 2020-07-08 DIAGNOSIS — K219 Gastro-esophageal reflux disease without esophagitis: Secondary | ICD-10-CM

## 2020-07-08 DIAGNOSIS — S3289XD Fracture of other parts of pelvis, subsequent encounter for fracture with routine healing: Secondary | ICD-10-CM

## 2020-07-08 NOTE — Progress Notes (Signed)
Location:  [facility] Room Number: 215 A Place of Service:  SNF (31) Provider:  Kenard Gower, DNP, FNP-BC  Patient Care Team: Patrice Paradise, MD as PCP - General (Physician Assistant) Antonieta Iba, MD as PCP - Cardiology (Cardiology)  Extended Emergency Contact Information Primary Emergency Contact: Sharmon Revere Address: [address]          [place], [place] [address] [place] Home Phone: [phone] Work Phone: [phone] Relation: Daughter  Code Status:    Goals of care: Advanced Directive information Advanced Directives 02/05/2018  Does Patient Have a Medical Advance Directive? No  Would patient like information on creating a medical advance directive? No - Patient declined     Chief Complaint  Patient presents with   Discharge Note    For discharge to [facility] on 07/09/20    HPI:  Pt is a 87 y.o. female who is for discharge to [facility] on 07/09/20 with Home health PT and OT.  She was admitted to [facility] on 06/24/20 post [facility] post fall from standing sustaining left pelvic sidewall and proximal left hip flexor intramuscular hematoma, minimally displaced left sacral ala fracture and comminuted and mildly displaced left superior ramus fracture.  Orthopedic was consulted and recommended weightbearing as tolerated with PT and OT supervision.  Patient was admitted to this facility for short-term rehabilitation after the patient's recent hospitalization.  Patient has completed SNF rehabilitation and therapy has cleared the patient for discharge.   Past Medical History:  Diagnosis Date   AKI (acute kidney injury) (HCC)    a. 02/2018 in setting of hip fx -->improved w/ IVF.   Closed displaced fracture of left femoral neck (HCC)    a. 02/2018 s/p ORIF.   Diastolic dysfunction    a. 02/2018 Limited Echo: EF > 65%, impaired diast relaxation. Nl RV fxn.    GERD  (gastroesophageal reflux disease)    Hypertension    Impaired fasting glucose    Persistent atrial fibrillation (HCC)    a. 02/2018 Dx following ORIF L hip 2/2 fx; b. CHA2DS2VASc = 4-->Eliquis.   Past Surgical History:  Procedure Laterality Date   ABDOMINAL HYSTERECTOMY     CHOLECYSTECTOMY     HIP SURGERY  02/06/2018   INTRAMEDULLARY (IM) NAIL INTERTROCHANTERIC Left 02/06/2018   Procedure: INTRAMEDULLARY (IM) NAIL INTERTROCHANTRIC;  Surgeon: Donato Heinz, MD;  Location: [facility] ORS;  Service: Orthopedics;  Laterality: Left;    Allergies  Allergen Reactions   Aspirin Nausea And Vomiting   Elemental Sulfur Hives    Outpatient Encounter Medications as of 07/08/2020  Medication Sig   apixaban (ELIQUIS) 2.5 MG TABS tablet Take 1 tablet (2.5 mg total) by mouth 2 (two) times daily.   Calcium Carb-Cholecalciferol (CALCIUM-VITAMIN D) 500-200 MG-UNIT tablet Take 1 tablet by mouth 2 (two) times daily.   diltiazem (TIAZAC) 300 MG 24 hr capsule Take 1 capsule (300 mg total) by mouth daily.   metoprolol tartrate (LOPRESSOR) 25 MG tablet Take 0.5 tablets (12.5 mg total) by mouth 2 (two) times daily.   Multiple Vitamin (MULTIVITAMIN) capsule Take 1 capsule by mouth daily.   omeprazole (PRILOSEC) 20 MG capsule Take 20 mg by mouth daily.   omeprazole (PRILOSEC) 20 MG capsule Take 20 mg by mouth daily.   oxyCODONE (OXY IR/ROXICODONE) 5 MG immediate release tablet Take 1 tablet (5 mg total) by mouth every 4 (four) hours as needed for severe pain (pain score 7-10).  TIADYLT ER 360 MG 24 hr capsule Take 360 mg by mouth daily as needed.   traMADol (ULTRAM) 50 MG tablet Take 1 tablet (50 mg total) by mouth every 4 (four) hours as needed for moderate pain (pain score 4-6).   No facility-administered encounter medications on file as of 07/08/2020.    Review of Systems  GENERAL: No change in appetite, no fatigue, no weight changes, no fever, chills or weakness MOUTH and THROAT: Denies oral discomfort,  gingival pain or bleeding RESPIRATORY: no cough, SOB, DOE, wheezing, hemoptysis CARDIAC: No chest pain, edema or palpitations GI: No abdominal pain, diarrhea, constipation, heart burn, nausea or vomiting GU: Denies dysuria, frequency, hematuria, incontinence, or discharge NEUROLOGICAL: Denies dizziness, syncope, numbness, or headache PSYCHIATRIC: Denies feelings of depression or anxiety. No report of hallucinations, insomnia, paranoia, or agitation   Immunization History  Administered Date(s) Administered   PFIZER Comirnaty(Gray Top)Covid-19 Tri-Sucrose Vaccine 06/23/2020   Pertinent  Health Maintenance Due  Topic Date Due   URINE MICROALBUMIN  Never done   DEXA SCAN  Never done   INFLUENZA VACCINE  08/04/2020   PNA vac Low Risk Adult (2 of 2 - PCV13) 12/12/2020   No flowsheet data found.   Vitals:   07/08/20 1000  BP: (!) 108/58  Pulse: 61  Resp: 19  Temp: 97.7 F (36.5 C)  Weight: 83 lb 6.4 oz (37.8 kg)  Height: 5\' 3"  (1.6 m)   Body mass index is 14.77 kg/m.  Physical Exam  GENERAL APPEARANCE:  In no acute distress.  SKIN:  Skin is warm and dry.  MOUTH and THROAT: Lips are without lesions. Oral mucosa is moist and without lesions.  RESPIRATORY: Breathing is even & unlabored, BS CTAB CARDIAC: RRR, no murmur,no extra heart sounds, no edema GI: Abdomen soft, normal BS, no masses, no tenderness EXTREMITIES:  Able to move X 4 extremities NEUROLOGICAL: There is no tremor. Speech is clear. Alert and oriented X 3. PSYCHIATRIC:  Affect and behavior are appropriate  Labs reviewed: Recent Labs    06/17/20 0207  NA 137  K 3.7  CL 103  CO2 25  GLUCOSE 134*  BUN 30*  CREATININE 1.31*  CALCIUM 9.1    Recent Labs    06/17/20 0207  WBC 20.0*  HGB 12.9  HCT 36.8  MCV 94.6  PLT 226   Lab Results  Component Value Date   TSH 0.709 02/09/2018   Lab Results  Component Value Date   HGBA1C 6.1 (H) 02/06/2018   No results found for: CHOL, HDL, LDLCALC,  LDLDIRECT, TRIG, CHOLHDL  Significant Diagnostic Results in last 30 days:  CT ABDOMEN PELVIS W CONTRAST  Addendum Date: 06/17/2020   ADDENDUM REPORT: 06/17/2020 04:35 ADDENDUM: Study discussed by telephone with Dr. 06/19/2020 on 06/17/2020 at 0423 hours. She advised the patient was hemodynamically stable at that time. Electronically Signed   By: 06/19/2020 M.D.   On: 06/17/2020 04:35   Result Date: 06/17/2020 CLINICAL DATA:  87 year old female status post fall out of bed landing on left buttock. Pain. EXAM: CT ABDOMEN AND PELVIS WITH CONTRAST TECHNIQUE: Multidetector CT imaging of the abdomen and pelvis was performed using the standard protocol following bolus administration of intravenous contrast. CONTRAST:  47mL OMNIPAQUE IOHEXOL 300 MG/ML  SOLN COMPARISON:  CT Abdomen 07/29/2015 and CT Abdomen and Pelvis 07/19/2010 FINDINGS: Lower chest: Stable pectus excavatum. Cardiac size is at the upper limits of normal. Mild tortuosity and mild to moderate atherosclerosis of the descending thoracic aorta. No pericardial  or pleural effusion. Respiratory motion, but lung bases appear clear. Hepatobiliary: Chronically absent gallbladder. Stable liver with chronic bile duct enlargement. Pancreas: Negative. Spleen: Stable, negative. Adrenals/Urinary Tract: Adrenal glands are stable and within normal limits. Kidneys are nonobstructed, with chronic bilateral renal cysts and left midpole nephrolithiasis. Renal enhancement and contrast excretion is symmetric and within normal limits. Normal proximal ureters. The urinary bladder is distended in the pelvis (380 mL) but otherwise unremarkable. Stomach/Bowel: No dilated large or small bowel. Diverticulosis of the sigmoid colon. No bowel inflammation identified. No free air or free fluid identified. Vascular/Lymphatic: Aortoiliac calcified atherosclerosis. Major arterial structures in the abdomen and pelvis remain patent. There is some active contrast extravasation into the  proximal left hip flexor muscles as seen on series 2, image 71. Portal venous system is patent. Reproductive: Absent uterus. 18 mm simple fluid density right ovarian cyst on series 2, image 48 appears inconsequential. Other: Positive for left pelvic sidewall and proximal left hip flexor intramuscular hematoma (series 2, images 67 and 69). No pelvic free fluid. Musculoskeletal: Diffuse osteopenia. Motion artifact in the lower chest. No acute lower rib fracture identified. Lower thoracic and lumbar vertebrae appear stable since 2017. Capacious spinal canal. Minimally displaced left sacral ala fracture most apparent on series 5, image 54. SI joints remain symmetric. Oblique mildly comminuted and displaced left inferior pubic ramus fracture (series 2, image 74). Comminuted and mildly displaced left superior pubic ramus fracture (series 5, image 25). Pubic symphysis remains aligned. No acetabular or iliac wing fracture identified. Previous left femur ORIF. No acute femur fracture identified. There is active contrast extravasation within intramuscular hematoma of the proximal anterior left hip flexors as seen on series 2 images 69 and 71. Intramuscular hematoma continues medial to the proximal left femur on image 84, with no active extravasation there. Additional left pelvic sidewall hematoma on image 67. No superficial hematoma identified. IMPRESSION: 1. Positive for multiple acute left pelvic fractures (#2) with associated pelvic sidewall and hip flexor intramuscular hematoma- including a small area of ACTIVE EXTRAVASATION of contrast on series 2, image 71. 2. Comminuted and mildly displaced left superior and inferior pubic ramus fractures. Comminuted minimally displaced left sacral ala fracture. 3. No acute traumatic injury identified in the abdomen or lower chest. 4. Distended urinary bladder (380 mL). 5. Aortic Atherosclerosis (ICD10-I70.0). Electronically Signed: By: Odessa Fleming M.D. On: 06/17/2020 04:20   DG Hip  Unilat W or Wo Pelvis 2-3 Views Left  Result Date: 06/17/2020 CLINICAL DATA:  Fall EXAM: DG HIP (WITH OR WITHOUT PELVIS) 2-3V LEFT COMPARISON:  None. FINDINGS: Fractures through the left pubic bone, superior and inferior pubic rami. Hardware in the proximal left femur for from remote injury. No acute femoral fracture. Early spurring in the hip joints bilaterally. IMPRESSION: Fractures through the left pubic bone, superior and inferior pubic rami. Electronically Signed   By: Charlett Nose M.D.   On: 06/17/2020 01:02    Assessment/Plan  1. Closed fracture of other part of pelvis with routine healing, subsequent encounter -   Orthopedics was consulted and no surgery was recommended -  WBAT -  for PT and OT, for therapeutic strengthening exercises - lidocaine (LIDODERM) 5 %; Place 2 patches onto the skin daily. Apply to pelvis. Remove & Discard patch within 12 hours or as directed by MD  Dispense: 60 patch; Refill: 0  2. Paroxysmal atrial fibrillation (HCC) - apixaban (ELIQUIS) 2.5 MG TABS tablet; Take 1 tablet (2.5 mg total) by mouth 2 (two) times daily.  Dispense: 60 tablet; Refill: 0 - metoprolol tartrate (LOPRESSOR) 25 MG tablet; Take 1 tablet (25 mg total) by mouth 2 (two) times daily.  Dispense: 30 tablet; Refill: 0 - TIADYLT ER 360 MG 24 hr capsule; Take 1 capsule (360 mg total) by mouth daily.  Dispense: 30 capsule; Refill: 0  3. Gastroesophageal reflux disease without esophagitis - omeprazole (PRILOSEC) 20 MG capsule; Take 1 capsule (20 mg total) by mouth daily.  Dispense: 30 capsule; Refill: 0    I have filled out patient's discharge paperwork and e-prescribed medications.  Patient will receive home health PT and OT.  DME provided:  None  Total discharge time: Greater than 30 minutes Greater than 50% was spent in counseling and coordination of care.   Discharge time involved coordination of the discharge process with social worker, nursing staff and therapy department. Medical  justification for home health services verified.   Kenard GowerMonina Medina-Vargas, DNP, MSN, FNP-BC [facility] [phone] (Monday-Friday 8:00 a.m. - 5:00 p.m.) [phone] (after hours)

## 2020-07-09 MED ORDER — METOPROLOL TARTRATE 25 MG PO TABS: 25.0000 mg | ORAL_TABLET | Freq: Two times a day (BID) | ORAL | 0 refills | Status: DC

## 2020-07-09 MED ORDER — APIXABAN 2.5 MG PO TABS: 2.5000 mg | ORAL_TABLET | Freq: Two times a day (BID) | ORAL | 0 refills | Status: DC

## 2020-07-09 MED ORDER — LIDOCAINE 5 % EX PTCH: 2.0000 | MEDICATED_PATCH | CUTANEOUS | 0 refills | Status: DC

## 2020-07-09 MED ORDER — TIADYLT ER 360 MG PO CP24: 360.0000 mg | ORAL_CAPSULE | Freq: Every day | ORAL | 0 refills | Status: DC

## 2020-07-09 MED ORDER — OMEPRAZOLE 20 MG PO CPDR: 20.0000 mg | DELAYED_RELEASE_CAPSULE | Freq: Every day | ORAL | 0 refills | Status: AC

## 2020-08-26 ENCOUNTER — Other Ambulatory Visit: Payer: Self-pay

## 2020-08-26 ENCOUNTER — Encounter: Payer: Self-pay | Admitting: Cardiovascular Disease

## 2020-08-26 ENCOUNTER — Ambulatory Visit (INDEPENDENT_AMBULATORY_CARE_PROVIDER_SITE_OTHER): Payer: Medicare Other | Admitting: Cardiovascular Disease

## 2020-08-26 VITALS — BP 132/60 | HR 64 | Ht 64.0 in | Wt 84.0 lb

## 2020-08-26 DIAGNOSIS — I480 Paroxysmal atrial fibrillation: Secondary | ICD-10-CM | POA: Diagnosis not present

## 2020-08-26 DIAGNOSIS — I10 Essential (primary) hypertension: Secondary | ICD-10-CM

## 2020-08-26 NOTE — Progress Notes (Signed)
20 cardiology Office Note  Date:  08/26/2020   ID:  Vanessa Brock, Vanessa Brock 18-Jun-1922, MRN [id number]  PCP:  Patrice Paradise, MD   Chief Complaint  Patient presents with   6 month follow up     Patient took a fall on Jun13, 2022; was treated at [facility] and is at [facility] currently. Medications reviewed by the patient's medication list by Diamantina Monks.     HPI:  87 year old female with a history of hypertension  GERD left femoral neck fracture status post ORIF, 02/2018 developed A. fib postoperatively,  who presents for follow-up of her atrial fibrillation  Last seen in clinic February 2022  Recently fell out of bed, Pelvic fracture - left pelvic sidewall and proximal left hip flexor intramuscular hematoma - minimally displaced left sacral ala fracture - oblique mildly comminuted and displaced left inferior pubic ramus fracture - comminuted and mildly displaced left superior pubic ramus fracture Medical management recommended She participated in physical therapy, rehab, now living at [facility] Denies any significant pain  Tolerating diltiazem 360 mg daily, metoprolol tartrate 25 twice daily and Eliquis Denies any tachypalpitations concerning for recurrent arrhythmia  EKG personally reviewed by myself on todays visit Normal sinus rhythm rate 64 bpm right bundle branch block   Other past medical history reviewed February 05, 2018, was walking into the garage when her foot caught the lip of the entryway between the home in the garage, and she fell forward.    left femoral neck fracture.   She underwent ORIF of the left hip  Postoperatively, she did develop rate controlled atrial fibrillation.   Metoprolol and Eliquis were added to her regimen.  completely asymptomatic.    Echocardiogram was performed and showed normal LV function with impaired diastolic relaxation.  She was subsequently discharged to rehab.   PMH:   has a past medical history of AKI (acute kidney injury)  (HCC), Closed displaced fracture of left femoral neck (HCC), Diastolic dysfunction, GERD (gastroesophageal reflux disease), Hypertension, Impaired fasting glucose, and Persistent atrial fibrillation (HCC).  PSH:    Past Surgical History:  Procedure Laterality Date   ABDOMINAL HYSTERECTOMY     CHOLECYSTECTOMY     HIP SURGERY  02/06/2018   INTRAMEDULLARY (IM) NAIL INTERTROCHANTERIC Left 02/06/2018   Procedure: INTRAMEDULLARY (IM) NAIL INTERTROCHANTRIC;  Surgeon: Donato Heinz, MD;  Location: [facility] ORS;  Service: Orthopedics;  Laterality: Left;    Current Outpatient Medications  Medication Sig Dispense Refill   apixaban (ELIQUIS) 2.5 MG TABS tablet Take 1 tablet (2.5 mg total) by mouth 2 (two) times daily. 60 tablet 0   Calcium Carb-Cholecalciferol (CALCIUM-VITAMIN D) 500-200 MG-UNIT tablet Take 1 tablet by mouth 2 (two) times daily.     metoprolol tartrate (LOPRESSOR) 25 MG tablet Take 1 tablet (25 mg total) by mouth 2 (two) times daily. 30 tablet 0   Multiple Vitamin (MULTIVITAMIN) capsule Take 1 capsule by mouth daily.     omeprazole (PRILOSEC) 20 MG capsule Take 1 capsule (20 mg total) by mouth daily. 30 capsule 0   TIADYLT ER 360 MG 24 hr capsule Take 1 capsule (360 mg total) by mouth daily. 30 capsule 0   lidocaine (LIDODERM) 5 % Place 2 patches onto the skin daily. Apply to pelvis. Remove & Discard patch within 12 hours or as directed by MD (Patient not taking: Reported on 08/26/2020) 60 patch 0   oxyCODONE (OXY IR/ROXICODONE) 5 MG immediate release tablet Take 1 tablet (5 mg total) by mouth every 4 (  four) hours as needed for severe pain (pain score 7-10). (Patient not taking: Reported on 08/26/2020) 30 tablet 0   traMADol (ULTRAM) 50 MG tablet Take 1 tablet (50 mg total) by mouth every 4 (four) hours as needed for moderate pain (pain score 4-6). (Patient not taking: Reported on 08/26/2020) 30 tablet 1   No current facility-administered medications for this visit.     Allergies:   Sulfur,  Elemental sulfur, and Aspirin   Social History:  The patient  reports that she has never smoked. She has never used smokeless tobacco. She reports that she does not drink alcohol and does not use drugs.   Family History:   family history includes Heart disease in her mother; Other in her father and sister.    Review of Systems: Review of Systems  Constitutional: Negative.   HENT: Negative.    Respiratory: Negative.    Cardiovascular: Negative.   Gastrointestinal: Negative.   Musculoskeletal: Negative.   Neurological: Negative.   Psychiatric/Behavioral: Negative.    All other systems reviewed and are negative.  PHYSICAL EXAM: VS:  BP 132/60 (BP Location: Left Arm, Patient Position: Sitting, Cuff Size: Normal)   Pulse 64   Ht 5\' 4"  (1.626 m)   Wt 84 lb (38.1 kg)   SpO2 98%   BMI 14.42 kg/m  , BMI Body mass index is 14.42 kg/m. Constitutional:  oriented to person, place, and time. No distress.  Thin HENT:  Head: Grossly normal Eyes:  no discharge. No scleral icterus.  Neck: No JVD, no carotid bruits  Cardiovascular: Regular rate and rhythm, no murmurs appreciated Pulmonary/Chest: Clear to auscultation bilaterally, no wheezes or rails Abdominal: Soft.  no distension.  no tenderness.  Musculoskeletal: Normal range of motion Neurological:  normal muscle tone. Coordination normal. No atrophy Skin: Skin warm and dry Psychiatric: normal affect, pleasant   Recent Labs: 06/17/2020: BUN 30; Creatinine, Ser 1.31; Hemoglobin 12.9; Platelets 226; Potassium 3.7; Sodium 137    Lipid Panel No results found for: CHOL, HDL, LDLCALC, TRIG    Wt Readings from Last 3 Encounters:  08/26/20 84 lb (38.1 kg)  07/08/20 83 lb 6.4 oz (37.8 kg)  06/17/20 85 lb (38.6 kg)      ASSESSMENT AND PLAN:  Problem List Items Addressed This Visit       Cardiology Problems   Paroxysmal atrial fibrillation (HCC) - Primary   Other Visit Diagnoses     Essential hypertension         Paroxysmal  atrial fibrillation Normal sinus rhythm, denies any tachypalpitations Continue current medications including diltiazem extended release and Eliquis Eliquis 2.5 twice daily No falls  Gait instability Recent fall out of bed Fracture pelvis  Essential hypertension Blood pressure is well controlled on today's visit. No changes made to the medications.    Total encounter time more than 25 minutes  Greater than 50% was spent in counseling and coordination of care with the patient    Signed, 06/19/20, M.D., Ph.D. [facility] [place], [place]

## 2020-08-26 NOTE — Patient Instructions (Signed)
Medication Instructions:   Your physician recommends that you continue on your current medications as directed. Please refer to the Current Medication list given to you today.  *If you need a refill on your cardiac medications before your next appointment, please call your pharmacy*   Lab Work:  None ordered  Testing/Procedures:  None ordered   Follow-Up: At [facility], you and your health needs are our priority.  As part of our continuing mission to provide you with exceptional heart care, we have created designated Provider Care Teams.  These Care Teams include your primary Cardiologist (physician) and Advanced Practice Providers (APPs -  Physician Assistants and Nurse Practitioners) who all work together to provide you with the care you need, when you need it.  We recommend signing up for the patient portal called "MyChart".  Sign up information is provided on this After Visit Summary.  MyChart is used to connect with patients for Virtual Visits (Telemedicine).  Patients are able to view lab/test results, encounter notes, upcoming appointments, etc.  Non-urgent messages can be sent to your provider as well.   To learn more about what you can do with MyChart, go to [email or web address].    Your next appointment:   6 - 9 month(s)  The format for your next appointment:   In Person  Provider:   You may see Julien Nordmann, MD or one of the following Advanced Practice Providers on your designated Care Team:   Nicolasa Ducking, NP Eula Listen, PA-C Marisue Ivan, PA-C Cadence Simpson, [place]

## 2020-09-06 ENCOUNTER — Other Ambulatory Visit: Payer: Self-pay

## 2020-09-06 ENCOUNTER — Emergency Department
Admission: EM | Admit: 2020-09-06 | Discharge: 2020-09-06 | Disposition: A | Discharge status: Home / Self Care | Payer: Medicare Other | Attending: Emergency Medicine | Admitting: Emergency Medicine

## 2020-09-06 ENCOUNTER — Emergency Department: Payer: Medicare Other

## 2020-09-06 DIAGNOSIS — I129 Hypertensive chronic kidney disease with stage 1 through stage 4 chronic kidney disease, or unspecified chronic kidney disease: Secondary | ICD-10-CM | POA: Insufficient documentation

## 2020-09-06 DIAGNOSIS — N3000 Acute cystitis without hematuria: Secondary | ICD-10-CM

## 2020-09-06 DIAGNOSIS — Z79899 Other long term (current) drug therapy: Secondary | ICD-10-CM | POA: Diagnosis not present

## 2020-09-06 DIAGNOSIS — R42 Dizziness and giddiness: Secondary | ICD-10-CM | POA: Diagnosis present

## 2020-09-06 DIAGNOSIS — R1033 Periumbilical pain: Secondary | ICD-10-CM | POA: Insufficient documentation

## 2020-09-06 DIAGNOSIS — Z7901 Long term (current) use of anticoagulants: Secondary | ICD-10-CM | POA: Diagnosis not present

## 2020-09-06 DIAGNOSIS — Z20822 Contact with and (suspected) exposure to covid-19: Secondary | ICD-10-CM | POA: Diagnosis not present

## 2020-09-06 DIAGNOSIS — R112 Nausea with vomiting, unspecified: Secondary | ICD-10-CM | POA: Diagnosis not present

## 2020-09-06 DIAGNOSIS — R1013 Epigastric pain: Secondary | ICD-10-CM | POA: Diagnosis not present

## 2020-09-06 DIAGNOSIS — N189 Chronic kidney disease, unspecified: Secondary | ICD-10-CM | POA: Insufficient documentation

## 2020-09-06 LAB — CBC WITH DIFFERENTIAL/PLATELET
Abs Immature Granulocytes: 0.1 10*3/uL — ABNORMAL HIGH (ref 0.00–0.07)
Basophils Absolute: 0.1 10*3/uL (ref 0.0–0.1)
Basophils Relative: 0 %
Eosinophils Absolute: 0 10*3/uL (ref 0.0–0.5)
Eosinophils Relative: 0 %
HCT: 40.4 % (ref 36.0–46.0)
Hemoglobin: 13.8 g/dL (ref 12.0–15.0)
Immature Granulocytes: 1 %
Lymphocytes Relative: 6 %
Lymphs Abs: 1.1 10*3/uL (ref 0.7–4.0)
MCH: 33.7 pg (ref 26.0–34.0)
MCHC: 34.2 g/dL (ref 30.0–36.0)
MCV: 98.5 fL (ref 80.0–100.0)
Monocytes Absolute: 0.8 10*3/uL (ref 0.1–1.0)
Monocytes Relative: 5 %
Neutro Abs: 15.1 10*3/uL — ABNORMAL HIGH (ref 1.7–7.7)
Neutrophils Relative %: 88 %
Platelets: 308 10*3/uL (ref 150–400)
RBC: 4.1 MIL/uL (ref 3.87–5.11)
RDW: 12.6 % (ref 11.5–15.5)
WBC: 17.1 10*3/uL — ABNORMAL HIGH (ref 4.0–10.5)
nRBC: 0 % (ref 0.0–0.2)

## 2020-09-06 LAB — COMPREHENSIVE METABOLIC PANEL
ALT: 23 U/L (ref 0–44)
AST: 36 U/L (ref 15–41)
Albumin: 4 g/dL (ref 3.5–5.0)
Alkaline Phosphatase: 123 U/L (ref 38–126)
Anion gap: 10 (ref 5–15)
BUN: 28 mg/dL — ABNORMAL HIGH (ref 8–23)
CO2: 26 mmol/L (ref 22–32)
Calcium: 9.9 mg/dL (ref 8.9–10.3)
Chloride: 99 mmol/L (ref 98–111)
Creatinine, Ser: 0.99 mg/dL (ref 0.44–1.00)
GFR, Estimated: 52 mL/min — ABNORMAL LOW (ref >60)
Glucose, Bld: 210 mg/dL — ABNORMAL HIGH (ref 70–99)
Potassium: 4.2 mmol/L (ref 3.5–5.1)
Sodium: 135 mmol/L (ref 135–145)
Total Bilirubin: 0.6 mg/dL (ref 0.3–1.2)
Total Protein: 6.8 g/dL (ref 6.5–8.1)

## 2020-09-06 LAB — URINALYSIS, COMPLETE (UACMP) WITH MICROSCOPIC
Bilirubin Urine: NEGATIVE
Glucose, UA: NEGATIVE mg/dL
Ketones, ur: NEGATIVE mg/dL
Nitrite: NEGATIVE
Protein, ur: NEGATIVE mg/dL
Specific Gravity, Urine: 1.02 (ref 1.005–1.030)
Squamous Epithelial / HPF: NONE SEEN (ref 0–5)
pH: 7 (ref 5.0–8.0)

## 2020-09-06 LAB — TROPONIN I (HIGH SENSITIVITY)
Troponin I (High Sensitivity): 6 ng/L (ref <18)
Troponin I (High Sensitivity): 6 ng/L (ref <18)

## 2020-09-06 LAB — CBC
HCT: 40 % (ref 36.0–46.0)
Hemoglobin: 13.8 g/dL (ref 12.0–15.0)
MCH: 33.9 pg (ref 26.0–34.0)
MCHC: 34.5 g/dL (ref 30.0–36.0)
MCV: 98.3 fL (ref 80.0–100.0)
Platelets: 295 10*3/uL (ref 150–400)
RBC: 4.07 MIL/uL (ref 3.87–5.11)
RDW: 12.5 % (ref 11.5–15.5)
WBC: 17.4 10*3/uL — ABNORMAL HIGH (ref 4.0–10.5)
nRBC: 0 % (ref 0.0–0.2)

## 2020-09-06 LAB — LIPASE, BLOOD: Lipase: 46 U/L (ref 11–51)

## 2020-09-06 LAB — RESP PANEL BY RT-PCR (FLU A&B, COVID) ARPGX2
Influenza A by PCR: NEGATIVE
Influenza B by PCR: NEGATIVE
SARS Coronavirus 2 by RT PCR: NEGATIVE

## 2020-09-06 MED ORDER — SODIUM CHLORIDE 0.9 % IV SOLN
2.0000 g | Freq: Once | INTRAVENOUS | Status: AC
  Administered 2020-09-06: 2 g via INTRAVENOUS
  Filled 2020-09-06: qty 20

## 2020-09-06 MED ORDER — ONDANSETRON HCL 4 MG/2ML IJ SOLN
4.0000 mg | Freq: Once | INTRAMUSCULAR | Status: AC
  Administered 2020-09-06: 4 mg via INTRAVENOUS
  Filled 2020-09-06: qty 2

## 2020-09-06 MED ORDER — ONDANSETRON 4 MG PO TBDP: 4.0000 mg | ORAL_TABLET | Freq: Three times a day (TID) | ORAL | 0 refills | Status: DC | PRN

## 2020-09-06 MED ORDER — CEPHALEXIN 500 MG PO CAPS: 500.0000 mg | ORAL_CAPSULE | Freq: Three times a day (TID) | ORAL | 0 refills | Status: AC

## 2020-09-06 MED ORDER — ACETAMINOPHEN 500 MG PO TABS
1000.0000 mg | ORAL_TABLET | Freq: Once | ORAL | Status: AC
  Administered 2020-09-06: 1000 mg via ORAL
  Filled 2020-09-06: qty 2

## 2020-09-06 MED ORDER — LACTATED RINGERS IV BOLUS
1000.0000 mL | Freq: Once | INTRAVENOUS | Status: AC
  Administered 2020-09-06: 1000 mL via INTRAVENOUS

## 2020-09-06 NOTE — ED Triage Notes (Signed)
Pt states that she has vomited 2-3 times today- pt states she feels shaky and dizzy- pt states it started this morning and did not eat breakfast- pt having upper abdominal pain

## 2020-09-06 NOTE — ED Provider Notes (Signed)
[facility] Emergency Department Provider Note ____________________________________________   Event Date/Time   First MD Initiated Contact with Patient 09/06/20 1225     (approximate)  I have reviewed the triage vital signs and the nursing notes.  HISTORY  Chief Complaint Dizziness   HPI Vanessa Brock is a 87 y.o. femalewho presents to the ED for evaluation of dizziness.  Chart review indicates mechanical fall with associated pelvic fractures managed medically, 2.5 months ago.  Otherwise history of HTN, GERD, atrial fibrillation on Eliquis, diltiazem and metoprolol.  Patient resides at a local SNF and typically ambulates with a walker these days.  She presents to the ED, accompanied by her daughter, for evaluation of epigastric discomfort and emesis starting this morning.  Daughter saw her on Thursday and spoke with her on the phone yesterday and she seemed fine then.  She reports feeling okay when she awoke this morning, but developed epigastric discomfort and 3 episodes of nonbloody nonbilious emesis while walking to breakfast this morning.  She reports dizziness and presyncope without syncope.  Denies vertigo.  Reports pressure behind her eyes as well bilaterally, but without vision changes.  They further report that patient has had diffuse tremulousness this morning which is unusual for her.  Denies chest pain, shortness of breath, syncope or falls.  Past Medical History:  Diagnosis Date   AKI (acute kidney injury) (HCC)    a. 02/2018 in setting of hip fx -->improved w/ IVF.   Closed displaced fracture of left femoral neck (HCC)    a. 02/2018 s/p ORIF.   Diastolic dysfunction    a. 02/2018 Limited Echo: EF > 65%, impaired diast relaxation. Nl RV fxn.    GERD (gastroesophageal reflux disease)    Hypertension    Impaired fasting glucose    Persistent atrial fibrillation (HCC)    a. 02/2018 Dx following ORIF L hip 2/2 fx; b. CHA2DS2VASc = 4-->Eliquis.     Patient Active Problem List   Diagnosis Date Noted   Macrocytic anemia 07/02/2020   Fall 06/25/2020   CKD (chronic kidney disease) 06/25/2020   Leukocytosis 06/25/2020   Paroxysmal atrial fibrillation (HCC) 07/21/2018   Hip fracture (HCC) 02/05/2018    Past Surgical History:  Procedure Laterality Date   ABDOMINAL HYSTERECTOMY     CHOLECYSTECTOMY     HIP SURGERY  02/06/2018   INTRAMEDULLARY (IM) NAIL INTERTROCHANTERIC Left 02/06/2018   Procedure: INTRAMEDULLARY (IM) NAIL INTERTROCHANTRIC;  Surgeon: Donato Heinz, MD;  Location: [facility] ORS;  Service: Orthopedics;  Laterality: Left;    Prior to Admission medications   Medication Sig Start Date End Date Taking? Authorizing Provider  apixaban (ELIQUIS) 2.5 MG TABS tablet Take 1 tablet (2.5 mg total) by mouth 2 (two) times daily. 07/09/20   Medina-Vargas, Monina C, NP  Calcium Carb-Cholecalciferol (CALCIUM-VITAMIN D) 500-200 MG-UNIT tablet Take 1 tablet by mouth 2 (two) times daily.    [provider]  diltiazem (CARDIZEM CD) 360 MG 24 hr capsule Take 1 capsule (360 mg total) by mouth daily. 08/26/20   Antonieta Iba, MD  lidocaine (LIDODERM) 5 % Place 2 patches onto the skin daily. Apply to pelvis. Remove & Discard patch within 12 hours or as directed by MD Patient not taking: Reported on 08/26/2020 07/09/20   Medina-Vargas, Monina C, NP  metoprolol tartrate (LOPRESSOR) 25 MG tablet Take 1 tablet (25 mg total) by mouth 2 (two) times daily. 07/09/20   Medina-Vargas, Monina C, NP  Multiple Vitamin (MULTIVITAMIN) capsule Take 1 capsule  by mouth daily.    [provider]  omeprazole (PRILOSEC) 20 MG capsule Take 1 capsule (20 mg total) by mouth daily. 07/09/20   Medina-Vargas, Monina C, NP  oxyCODONE (OXY IR/ROXICODONE) 5 MG immediate release tablet Take 1 tablet (5 mg total) by mouth every 4 (four) hours as needed for severe pain (pain score 7-10). Patient not taking: Reported on 08/26/2020 02/09/18   Dedra Skeens, PA-C  traMADol  (ULTRAM) 50 MG tablet Take 1 tablet (50 mg total) by mouth every 4 (four) hours as needed for moderate pain (pain score 4-6). Patient not taking: Reported on 08/26/2020 02/09/18   Dedra Skeens, PA-C    Allergies Sulfur, Elemental sulfur, and Aspirin  Family History  Problem Relation Age of Onset   Heart disease Mother        died @ 38 - ? heart issues   Other Father        died @ 14   Other Sister        24    Social History Social History   Tobacco Use   Smoking status: Never   Smokeless tobacco: Never  Substance Use Topics   Alcohol use: No   Drug use: No    Review of Systems  Constitutional: No fever/chills Eyes: No visual changes. ENT: No sore throat. Cardiovascular: Denies chest pain. Respiratory: Denies shortness of breath. Gastrointestinal: Positive for abdominal pain, nausea and vomiting.  No diarrhea.  No constipation. Genitourinary: Negative for dysuria. Musculoskeletal: Negative for back pain. Skin: Negative for rash. Neurological: Negative for headaches, focal weakness or numbness.  ____________________________________________   PHYSICAL EXAM:  VITAL SIGNS: Vitals:   09/06/20 1230 09/06/20 1400  BP: (!) 146/56 (!) 149/62  Pulse: 66 63  Resp: 16 16  Temp: 97.7 F (36.5 C)   SpO2: 95% 96%     Constitutional: Alert and oriented. Well appearing and in no acute distress.  Quite frail appearing. Eyes: Conjunctivae are normal. PERRL. EOMI. Head: Atraumatic. Nose: No congestion/rhinnorhea. Mouth/Throat: Mucous membranes are moist.  Oropharynx non-erythematous. Neck: No stridor. No cervical spine tenderness to palpation. Cardiovascular: Normal rate, regular rhythm. Grossly normal heart sounds.  Good peripheral circulation. Respiratory: Normal respiratory effort.  No retractions. Lungs CTAB. Gastrointestinal: Soft , nondistended. No CVA tenderness. Epigastric and periumbilical tenderness without peritoneal features.  Lower abdomen is Musculoskeletal:  No lower extremity tenderness nor edema.  No joint effusions. No signs of acute trauma. Neurologic:  Normal speech and language. No gross focal neurologic deficits are appreciated.  Skin:  Skin is warm, dry and intact. No rash noted. Psychiatric: Mood and affect are normal. Speech and behavior are normal.  ____________________________________________   LABS (all labs ordered are listed, but only abnormal results are displayed)  Labs Reviewed  CBC - Abnormal; Notable for the following components:      Result Value   WBC 17.4 (*)    All other components within normal limits  COMPREHENSIVE METABOLIC PANEL - Abnormal; Notable for the following components:   Glucose, Bld 210 (*)    BUN 28 (*)    GFR, Estimated 52 (*)    All other components within normal limits  RESP PANEL BY RT-PCR (FLU A&B, COVID) ARPGX2  LIPASE, BLOOD  URINALYSIS, COMPLETE (UACMP) WITH MICROSCOPIC  DIFFERENTIAL  TROPONIN I (HIGH SENSITIVITY)  TROPONIN I (HIGH SENSITIVITY)   ____________________________________________  12 Lead EKG  Sinus rhythm the rate is 65 bpm.  Normal axis.  Right bundle.  No STEMI. ____________________________________________  RADIOLOGY  ED MD  interpretation: 2 view CXR reviewed by me without evidence of acute cardiopulmonary pathology. CT head reviewed by me without evidence of acute intracranial pathology.  Official radiology report(s): CT ABDOMEN PELVIS WO CONTRAST  Result Date: 09/06/2020 CLINICAL DATA:  Upper abdominal pain, vomiting, shaking and dizziness. EXAM: CT ABDOMEN AND PELVIS WITHOUT CONTRAST TECHNIQUE: Multidetector CT imaging of the abdomen and pelvis was performed following the standard protocol without IV contrast. COMPARISON:  Prior CT of the abdomen and pelvis with contrast on 06/17/2020 FINDINGS: Lower chest: No acute abnormality. Hepatobiliary: No focal liver abnormality is seen. Status post cholecystectomy. Stable chronic extrahepatic biliary dilatation post  cholecystectomy. Pancreas: Unremarkable. No pancreatic ductal dilatation or surrounding inflammatory changes. Spleen: Normal in size without focal abnormality. Adrenals/Urinary Tract: No adrenal masses. Stable bilateral renal cysts and nonobstructing left renal calculi. No ureteral or bladder calculi. The bladder is less distended compared to the prior study and deep at the pelvic floor with persistent evidence of pelvic floor relaxation. Stomach/Bowel: Bowel shows no evidence of obstruction, ileus, inflammation or visible lesion. No free intraperitoneal air identified. Diverticulosis of the sigmoid colon without evidence of acute diverticulitis. Appendix is not discretely visualized. Vascular/Lymphatic: Stable aortic atherosclerosis. No enlarged abdominal or pelvic lymph nodes. Reproductive: Status post hysterectomy. No adnexal masses. Other: No abdominal wall hernia or abnormality. No focal abscess or ascites. Musculoskeletal: Osteopenia without evidence of fracture or bony lesions. Prior ORIF of the proximal left femur. IMPRESSION: 1. No acute findings in the abdomen or pelvis. 2. Stable dilatation of the extrahepatic bile ducts after prior cholecystectomy. 3. Stable nonobstructing left renal calculi. 4. Sigmoid diverticulosis without evidence of diverticulitis. Electronically Signed   By: Irish Lack M.D.   On: 09/06/2020 14:15   DG Chest 2 View  Result Date: 09/06/2020 CLINICAL DATA:  Vomiting. EXAM: CHEST - 2 VIEW COMPARISON:  Chest radiograph 07/19/2010. FINDINGS: Monitoring leads overlie the patient. Stable cardiac and mediastinal contours. Aortic atherosclerosis. Emphysematous change. Lungs are hyperexpanded. No large area pulmonary consolidation. No pleural effusion or pneumothorax. IMPRESSION: No acute cardiopulmonary process. Electronically Signed   By: Annia Belt M.D.   On: 09/06/2020 13:31   CT HEAD WO CONTRAST ( )  Result Date: 09/06/2020 CLINICAL DATA:  Headache, dizziness, vomiting  EXAM: CT HEAD WITHOUT CONTRAST TECHNIQUE: Contiguous axial images were obtained from the base of the skull through the vertex without intravenous contrast. COMPARISON:  None. FINDINGS: Brain: Age related atrophy pattern and chronic white matter microvascular changes throughout both cerebral hemispheres. No acute intracranial hemorrhage, new mass lesion, acute infarction, midline shift, herniation, hydrocephalus, or extra-axial fluid collection. No focal mass effect or edema. Cisterns are patent. Cerebellar atrophy as well. Vascular: Intracranial atherosclerosis at the skull base. No hyperdense vessel. Skull: Normal. Negative for fracture or focal lesion. Sinuses/Orbits: No acute finding. Other: None. IMPRESSION: Atrophy and white matter microvascular ischemic changes. No acute intracranial abnormality by noncontrast CT. Electronically Signed   By: Judie Petit.  Shick M.D.   On: 09/06/2020 13:53    ____________________________________________   PROCEDURES and INTERVENTIONS  Procedure(s) performed (including Critical Care):  .1-3 Lead EKG Interpretation  Date/Time: 09/06/2020 1:01 PM Performed by: Delton Prairie, MD Authorized by: Delton Prairie, MD     Interpretation: normal     ECG rate:  68   ECG rate assessment: normal     Rhythm: sinus rhythm     Ectopy: none     Conduction: normal    Medications  lactated ringers bolus 1,000 mL (1,000 mLs Intravenous New Bag/Given 09/06/20 1359)  ondansetron (ZOFRAN) injection 4 mg (4 mg Intravenous Given 09/06/20 1359)  acetaminophen (TYLENOL) tablet 1,000 mg (1,000 mg Oral Given 09/06/20 1359)    ____________________________________________   MDM / ED COURSE   87 year old woman presents to the ED after developing midline abdominal discomfort, emesis and dizziness earlier today.  Normal vitals and examination with some periumbilical and epigastric tenderness to palpation without peritoneal features.  She overall looks well without evidence of trauma, neurologic or  vascular deficits.  CT of her head without evidence of ICH or stigmata of CVA.  CXR without infiltration and CT abdomen/pelvis without evidence of acute intra-abdominal pathology.  Lab work demonstrates leukocytosis to 17,000, and is otherwise unremarkable.  Evidence of pancreatitis or ACS.  Suspicion for the possibility of acute cystitis causing her symptoms.  Awaiting cath urine.  Patient signed out to oncoming provider to follow-up on her urine and second troponin.  Denies any remarkable rise in her troponin or new features here in the ED, anticipate she be suitable to return to her facility, plus or minus antibiotics depending on her urinalysis.  Clinical Course as of 09/06/20 1520  Sat Sep 06, 2020  1439 Reassessed.  Patient comfortably asleep with normal vitals.  I discussed CBC results with daughter at the bedside and we discussed imaging results that are reassuring.  We discussed need for urine and the possibility of UTI as well as pending metabolic panel. [DS]    Clinical Course User Index [DS] Delton PrairieSmith, Teeghan Hammer, MD    ____________________________________________   FINAL CLINICAL IMPRESSION(S) / ED DIAGNOSES  Final diagnoses:  Dizziness  Non-intractable vomiting with nausea, unspecified vomiting type     ED Discharge Orders     None        Vietta Bonifield   Note:  This document was prepared using Dragon voice recognition software and may include unintentional dictation errors.    Delton PrairieSmith, Ed Rayson, MD 09/06/20 1520

## 2020-09-06 NOTE — ED Provider Notes (Signed)
Assumed care from Dr. Katrinka Blazing at 3 PM. Briefly, the patient is a 87 y.o. female with PMHx of  has a past medical history of AKI (acute kidney injury) (HCC), Closed displaced fracture of left femoral neck (HCC), Diastolic dysfunction, GERD (gastroesophageal reflux disease), Hypertension, Impaired fasting glucose, and Persistent atrial fibrillation (HCC). here with generalized weakness, dizziness. Labs notable for mild leukocytosis, but o/w unremarkable. Slightly elevated BUN. IVF given. Plan to f/u UA. CXR, CTs reviewed and are unremarkable.  Labs Reviewed  CBC - Abnormal; Notable for the following components:      Result Value   WBC 17.4 (*)    All other components within normal limits  URINALYSIS, COMPLETE (UACMP) WITH MICROSCOPIC - Abnormal; Notable for the following components:   Hgb urine dipstick TRACE (*)    Leukocytes,Ua MODERATE (*)    Bacteria, UA RARE (*)    All other components within normal limits  COMPREHENSIVE METABOLIC PANEL - Abnormal; Notable for the following components:   Glucose, Bld 210 (*)    BUN 28 (*)    GFR, Estimated 52 (*)    All other components within normal limits  CBC WITH DIFFERENTIAL/PLATELET - Abnormal; Notable for the following components:   WBC 17.1 (*)    Neutro Abs 15.1 (*)    Abs Immature Granulocytes 0.10 (*)    All other components within normal limits  RESP PANEL BY RT-PCR (FLU A&B, COVID) ARPGX2  URINE CULTURE  LIPASE, BLOOD  DIFFERENTIAL  TROPONIN I (HIGH SENSITIVITY)  TROPONIN I (HIGH SENSITIVITY)    Course of Care: UA returned positive for what appears to be UTI.  The patient feels markedly improved after receiving fluids and I have given her dose of Rocephin.  She feels much improved.  She is tolerating p.o.  She lives in an assisted living facility but has someone that brings all of her medications and meals to her.  She feels comfortable with attempt to manage as an outpatient and family is in agreement.  Will discharge with Keflex,  Zofran, and good return precautions.     Shaune Pollack, MD 09/06/20 [phone]

## 2020-09-08 LAB — URINE CULTURE: Culture: NO GROWTH

## 2020-11-20 ENCOUNTER — Telehealth: Payer: Self-pay | Admitting: Cardiovascular Disease

## 2020-11-20 NOTE — Telephone Encounter (Signed)
Please call to discuss Eliquis. Daughter, Jasmine December states that this prescription has went to a higher tier and will be more expensive. She asks if patient can be switched to Xarelto

## 2020-11-21 NOTE — Telephone Encounter (Signed)
Attempted to reach back out to pt's daughter Vanessa Brock [facility] approved) Unsuccessful, phone ring twice then to VM [facility]

## 2020-11-21 NOTE — Telephone Encounter (Signed)
Attempted to reach out again to [facility], unable to make contact. LMTCB  Advised on Eliquis PA, see if Mrs. Mealor can be covered by calling [phone]). Learn more about Prescription Coverage Assistance.

## 2020-11-24 NOTE — Telephone Encounter (Signed)
Attempted to reach out again to pt's daughter Rings twice, then to VM [facility] if still needing help with Eliquis

## 2020-11-25 NOTE — Telephone Encounter (Signed)
Attempted to reach out to pt's daughter (3rd attempt) Unsuccessful to make contact LMTCB if still needing help with Eliquis  Mailing Eliquis PA application to address on file

## 2020-12-21 ENCOUNTER — Other Ambulatory Visit: Payer: Self-pay

## 2020-12-21 ENCOUNTER — Emergency Department: Payer: Medicare Other

## 2020-12-21 DIAGNOSIS — S0990XA Unspecified injury of head, initial encounter: Secondary | ICD-10-CM | POA: Diagnosis present

## 2020-12-21 DIAGNOSIS — I129 Hypertensive chronic kidney disease with stage 1 through stage 4 chronic kidney disease, or unspecified chronic kidney disease: Secondary | ICD-10-CM | POA: Insufficient documentation

## 2020-12-21 DIAGNOSIS — N189 Chronic kidney disease, unspecified: Secondary | ICD-10-CM | POA: Insufficient documentation

## 2020-12-21 DIAGNOSIS — Z7901 Long term (current) use of anticoagulants: Secondary | ICD-10-CM | POA: Insufficient documentation

## 2020-12-21 DIAGNOSIS — W010XXA Fall on same level from slipping, tripping and stumbling without subsequent striking against object, initial encounter: Secondary | ICD-10-CM | POA: Insufficient documentation

## 2020-12-21 DIAGNOSIS — I480 Paroxysmal atrial fibrillation: Secondary | ICD-10-CM | POA: Insufficient documentation

## 2020-12-21 DIAGNOSIS — Z79899 Other long term (current) drug therapy: Secondary | ICD-10-CM | POA: Insufficient documentation

## 2020-12-21 MED ORDER — ACETAMINOPHEN 325 MG PO TABS
650.0000 mg | ORAL_TABLET | Freq: Once | ORAL | Status: AC
  Administered 2020-12-21: 22:00:00 650 mg via ORAL
  Filled 2020-12-21: qty 2

## 2020-12-21 NOTE — ED Triage Notes (Addendum)
Pt to ED via EMS from [facility] assisted living, pt states that she was walking to bathroom, the lights were off and she tripped and fell. Pt states the left side of her butt is sore, ambulatory on scene. Pt does take eliquis and states she hit her head when she fell. NIH negative. Pt denies any neuro symptoms or headache, scalp intact no hematoma.

## 2020-12-21 NOTE — ED Notes (Signed)
Pt assisted to bedpan at this time.

## 2020-12-22 ENCOUNTER — Emergency Department
Admission: EM | Admit: 2020-12-22 | Discharge: 2020-12-22 | Disposition: A | Discharge status: Home / Self Care | Payer: Medicare Other | Attending: Emergency Medicine | Admitting: Emergency Medicine

## 2020-12-22 DIAGNOSIS — W19XXXA Unspecified fall, initial encounter: Secondary | ICD-10-CM

## 2020-12-22 DIAGNOSIS — S0990XA Unspecified injury of head, initial encounter: Secondary | ICD-10-CM

## 2020-12-22 NOTE — Discharge Instructions (Addendum)
You may take Tylenol as needed for pain.  Return to the ER for worsening symptoms, persistent vomiting, lethargy or other concerns. °

## 2020-12-22 NOTE — ED Provider Notes (Signed)
[facility] Emergency Department Provider Note   ____________________________________________   Event Date/Time   First MD Initiated Contact with Patient 12/22/20 [phone]     (approximate)  I have reviewed the triage vital signs and the nursing notes.   HISTORY  Chief Complaint Fall    HPI Vanessa Brock is a 87 y.o. female brought to the ED via EMS from [facility] assisted living facility status post mechanical fall with minor head injury.  Patient reports she was ambulating with her walker to the restroom with the lights off and she tripped and fell, striking the left side of her head.  Denies LOC.  Patient does take Eliquis.  Also reports the left side of her buttocks is sore but she was ambulatory on the scene.  Denies headache after Tylenol was administered, vision changes, neck pain, chest pain, shortness of breath, abdominal pain, nausea, vomiting, dysuria or dizziness.     Past Medical History:  Diagnosis Date   AKI (acute kidney injury) (HCC)    a. 02/2018 in setting of hip fx -->improved w/ IVF.   Closed displaced fracture of left femoral neck (HCC)    a. 02/2018 s/p ORIF.   Diastolic dysfunction    a. 02/2018 Limited Echo: EF > 65%, impaired diast relaxation. Nl RV fxn.    GERD (gastroesophageal reflux disease)    Hypertension    Impaired fasting glucose    Persistent atrial fibrillation (HCC)    a. 02/2018 Dx following ORIF L hip 2/2 fx; b. CHA2DS2VASc = 4-->Eliquis.    Patient Active Problem List   Diagnosis Date Noted   Macrocytic anemia 07/02/2020   Fall 06/25/2020   CKD (chronic kidney disease) 06/25/2020   Leukocytosis 06/25/2020   Paroxysmal atrial fibrillation (HCC) 07/21/2018   Hip fracture (HCC) 02/05/2018    Past Surgical History:  Procedure Laterality Date   ABDOMINAL HYSTERECTOMY     CHOLECYSTECTOMY     HIP SURGERY  02/06/2018   INTRAMEDULLARY (IM) NAIL INTERTROCHANTERIC Left 02/06/2018   Procedure: INTRAMEDULLARY (IM) NAIL  INTERTROCHANTRIC;  Surgeon: Donato Heinz, MD;  Location: [facility] ORS;  Service: Orthopedics;  Laterality: Left;    Prior to Admission medications   Medication Sig Start Date End Date Taking? Authorizing Provider  apixaban (ELIQUIS) 2.5 MG TABS tablet Take 1 tablet (2.5 mg total) by mouth 2 (two) times daily. 07/09/20   Medina-Vargas, Monina C, NP  Calcium Carb-Cholecalciferol (CALCIUM-VITAMIN D) 500-200 MG-UNIT tablet Take 1 tablet by mouth 2 (two) times daily.    [provider]  diltiazem (CARDIZEM CD) 360 MG 24 hr capsule Take 1 capsule (360 mg total) by mouth daily. 08/26/20   Antonieta Iba, MD  lidocaine (LIDODERM) 5 % Place 2 patches onto the skin daily. Apply to pelvis. Remove & Discard patch within 12 hours or as directed by MD Patient not taking: Reported on 08/26/2020 07/09/20   Medina-Vargas, Monina C, NP  metoprolol tartrate (LOPRESSOR) 25 MG tablet Take 1 tablet (25 mg total) by mouth 2 (two) times daily. 07/09/20   Medina-Vargas, Monina C, NP  Multiple Vitamin (MULTIVITAMIN) capsule Take 1 capsule by mouth daily.    [provider]  omeprazole (PRILOSEC) 20 MG capsule Take 1 capsule (20 mg total) by mouth daily. 07/09/20   Medina-Vargas, Monina C, NP  ondansetron (ZOFRAN ODT) 4 MG disintegrating tablet Take 1 tablet (4 mg total) by mouth every 8 (eight) hours as needed for nausea or vomiting. 09/06/20   Shaune Pollack, MD  oxyCODONE (OXY IR/ROXICODONE) 5 MG immediate release tablet Take 1 tablet (5 mg total) by mouth every 4 (four) hours as needed for severe pain (pain score 7-10). Patient not taking: Reported on 08/26/2020 02/09/18   Dedra Skeens, PA-C  traMADol (ULTRAM) 50 MG tablet Take 1 tablet (50 mg total) by mouth every 4 (four) hours as needed for moderate pain (pain score 4-6). Patient not taking: Reported on 08/26/2020 02/09/18   Dedra Skeens, PA-C    Allergies Sulfur, Elemental sulfur, and Aspirin  Family History  Problem Relation Age of Onset   Heart disease  Mother        died @ 35 - ? heart issues   Other Father        died @ 34   Other Sister        67    Social History Social History   Tobacco Use   Smoking status: Never   Smokeless tobacco: Never  Substance Use Topics   Alcohol use: No   Drug use: No    Review of Systems  Constitutional: Positive for mechanical fall.  No fever/chills Eyes: No visual changes. ENT: No sore throat. Cardiovascular: Denies chest pain. Respiratory: Denies shortness of breath. Gastrointestinal: No abdominal pain.  No nausea, no vomiting.  No diarrhea.  No constipation. Genitourinary: Negative for dysuria. Musculoskeletal: Negative for back pain. Skin: Negative for rash. Neurological: Positive for minor head injury.  Negative for headaches, focal weakness or numbness.   ____________________________________________   PHYSICAL EXAM:  VITAL SIGNS: ED Triage Vitals [12/21/20 2218]  Enc Vitals Group     BP (!) 171/74     Pulse Rate 78     Resp 18     Temp 97.8 F (36.6 C)     Temp Source Oral     SpO2 94 %     Weight 108 lb 4.8 oz (49.1 kg)     Height 5\' 3"  (1.6 m)     Head Circumference      Peak Flow      Pain Score 0     Pain Loc      Pain Edu?      Excl. in GC?     Constitutional: Alert and oriented.  Elderly appearing and in no acute distress. Eyes: Conjunctivae are normal. PERRL. EOMI. Head: Atraumatic.  No abrasion, hematoma, laceration or active bleeding. Nose: No congestion/rhinnorhea. Mouth/Throat: Mucous membranes are mildly dry. Neck: No stridor.  No cervical spine tenderness to palpation. Cardiovascular: Normal rate, regular rhythm. Grossly normal heart sounds.  Good peripheral circulation. Respiratory: Normal respiratory effort.  No retractions. Lungs CTAB. Gastrointestinal: Soft and nontender. No distention. No abdominal bruits. No CVA tenderness. Musculoskeletal: No lower extremity tenderness nor edema.  No joint effusions. Neurologic:  Normal speech and  language. No gross focal neurologic deficits are appreciated. MAEx4. Skin:  Skin is warm, dry and intact. No rash noted. Psychiatric: Mood and affect are normal. Speech and behavior are normal.  ____________________________________________   LABS (all labs ordered are listed, but only abnormal results are displayed)  Labs Reviewed - No data to display ____________________________________________  EKG  None ____________________________________________  RADIOLOGY I, Renwick Asman J, personally viewed and evaluated these images (plain radiographs) as part of my medical decision making, as well as reviewing the written report by the radiologist.  ED MD interpretation: No ICH  Official radiology report(s): CT Head Wo Contrast  Result Date: 12/21/2020 CLINICAL DATA:  Status post fall. EXAM: CT HEAD WITHOUT CONTRAST TECHNIQUE: Contiguous axial  images were obtained from the base of the skull through the vertex without intravenous contrast. COMPARISON:  September 06, 2020 FINDINGS: Brain: There is mild cerebral atrophy with widening of the extra-axial spaces and ventricular dilatation. There are areas of decreased attenuation within the white matter tracts of the supratentorial brain, consistent with microvascular disease changes. Vascular: No hyperdense vessel or unexpected calcification. Skull: A chronic fracture of the right mandibular condyle is seen. Sinuses/Orbits: No acute finding. Other: None. IMPRESSION: 1. Generalized cerebral atrophy. 2. No acute intracranial abnormality. Electronically Signed   By: Aram Candela M.D.   On: 12/21/2020 23:19    ____________________________________________   PROCEDURES  Procedure(s) performed (including Critical Care):  Procedures   ____________________________________________   INITIAL IMPRESSION / ASSESSMENT AND PLAN / ED COURSE  As part of my medical decision making, I reviewed the following data within the electronic MEDICAL RECORD NUMBER  History obtained from family, Nursing notes reviewed and incorporated, Radiograph reviewed, and Notes from prior ED visits     87 year old female who presents status post mechanical fall with minor head injury.  Differential diagnosis includes but is not limited to ICH, SAH, SDH, etc.  Patient is here with her children; updated them on unremarkable CT head.  Patient complains of no pain after Tylenol administration.  Strict return precautions given.  All verbalized understanding and agree with plan of care      ____________________________________________   FINAL CLINICAL IMPRESSION(S) / ED DIAGNOSES  Final diagnoses:  Fall, initial encounter  Minor head injury, initial encounter     ED Discharge Orders     None        Note:  This document was prepared using Dragon voice recognition software and may include unintentional dictation errors.    Irean Hong, MD 12/22/20 [phone]

## 2021-02-27 ENCOUNTER — Ambulatory Visit (INDEPENDENT_AMBULATORY_CARE_PROVIDER_SITE_OTHER): Payer: Medicare Other | Admitting: Cardiovascular Disease

## 2021-02-27 ENCOUNTER — Other Ambulatory Visit: Payer: Self-pay

## 2021-02-27 ENCOUNTER — Encounter: Payer: Self-pay | Admitting: Cardiovascular Disease

## 2021-02-27 VITALS — BP 140/62 | HR 64 | Ht 64.0 in | Wt 95.0 lb

## 2021-02-27 DIAGNOSIS — I480 Paroxysmal atrial fibrillation: Secondary | ICD-10-CM

## 2021-02-27 DIAGNOSIS — I10 Essential (primary) hypertension: Secondary | ICD-10-CM | POA: Diagnosis not present

## 2021-02-27 NOTE — Progress Notes (Signed)
20 cardiology Office Note  Date:  02/27/2021   ID:  Vanessa Brock, DOB Oct 22, 1922, MRN [id number]  PCP:  Patrice Paradise, MD   Chief Complaint  Patient presents with   6 month follow up     "Doing well." Medications reviewed by the patient's medication list from [facility].     HPI:  88 year old female with a history of hypertension  GERD left femoral neck fracture status post ORIF, 02/2018 developed A. fib postoperatively,  who presents for follow-up of her atrial fibrillation  Last seen in clinic February 2022  Long discussion with patient's daughter who presents with her today Numerous falls over the past several years Fall 03/2018, fracture left femoral neck requiring surgery Postoperatively developed atrial fibrillation Converting back to normal sinus rhythm on medications.  First visit with cardiology in follow-up was normal sinus rhythm  No further episodes of atrial fibrillation since that time  09/2020: dizzy, uti  Fall 2 weeks before christmas 2022  Another fall  in dec 2022, fell out of bed,  Has to walk with assistance secondary to gait instability  Currently taking diltiazem 300 daily metoprolol tartrate 25 twice daily Eliquis 2.5 twice daily  EKG personally reviewed by myself on todays visit Normal sinus rhythm rate 64 bpm right bundle branch block  Other past medical history reviewed February 05, 2018, was walking into the garage when her foot caught the lip of the entryway between the home in the garage, and she fell forward.    left femoral neck fracture.   She underwent ORIF of the left hip  Postoperatively, she did develop rate controlled atrial fibrillation.   Metoprolol and Eliquis were added to her regimen.  completely asymptomatic.    Echocardiogram was performed and showed normal LV function with impaired diastolic relaxation.  She was subsequently discharged to rehab.   PMH:   has a past medical history of AKI (acute kidney injury) (HCC),  Closed displaced fracture of left femoral neck (HCC), Diastolic dysfunction, GERD (gastroesophageal reflux disease), Hypertension, Impaired fasting glucose, and Persistent atrial fibrillation (HCC).  PSH:    Past Surgical History:  Procedure Laterality Date   ABDOMINAL HYSTERECTOMY     CHOLECYSTECTOMY     HIP SURGERY  02/06/2018   INTRAMEDULLARY (IM) NAIL INTERTROCHANTERIC Left 02/06/2018   Procedure: INTRAMEDULLARY (IM) NAIL INTERTROCHANTRIC;  Surgeon: Donato Heinz, MD;  Location: [facility] ORS;  Service: Orthopedics;  Laterality: Left;    Current Outpatient Medications  Medication Sig Dispense Refill   acetaminophen (TYLENOL) 325 MG tablet Take 325 mg by mouth every 4 (four) hours as needed.     apixaban (ELIQUIS) 2.5 MG TABS tablet Take 1 tablet (2.5 mg total) by mouth 2 (two) times daily. 60 tablet 0   Calcium Carb-Cholecalciferol (CALCIUM-VITAMIN D) 500-200 MG-UNIT tablet Take 1 tablet by mouth 2 (two) times daily.     Docusate Sodium (DSS) 100 MG CAPS Take by mouth as needed.     metoprolol tartrate (LOPRESSOR) 25 MG tablet Take 1 tablet (25 mg total) by mouth 2 (two) times daily. 30 tablet 0   mirtazapine (REMERON) 7.5 MG tablet Take 7.5 mg by mouth at bedtime.     Multiple Vitamin (MULTIVITAMIN) capsule Take 1 capsule by mouth daily.     omeprazole (PRILOSEC) 20 MG capsule Take 1 capsule (20 mg total) by mouth daily. 30 capsule 0   TIADYLT ER 300 MG 24 hr capsule Take 300 mg by mouth daily.     lidocaine (LIDODERM) 5 %  Place 2 patches onto the skin daily. Apply to pelvis. Remove & Discard patch within 12 hours or as directed by MD (Patient not taking: Reported on 08/26/2020) 60 patch 0   ondansetron (ZOFRAN ODT) 4 MG disintegrating tablet Take 1 tablet (4 mg total) by mouth every 8 (eight) hours as needed for nausea or vomiting. (Patient not taking: Reported on 02/27/2021) 20 tablet 0   oxyCODONE (OXY IR/ROXICODONE) 5 MG immediate release tablet Take 1 tablet (5 mg total) by mouth every 4  (four) hours as needed for severe pain (pain score 7-10). (Patient not taking: Reported on 08/26/2020) 30 tablet 0   traMADol (ULTRAM) 50 MG tablet Take 1 tablet (50 mg total) by mouth every 4 (four) hours as needed for moderate pain (pain score 4-6). (Patient not taking: Reported on 08/26/2020) 30 tablet 1   No current facility-administered medications for this visit.     Allergies:   Elemental sulfur, Sulfur, and Aspirin   Social History:  The patient  reports that she has never smoked. She has never used smokeless tobacco. She reports that she does not drink alcohol and does not use drugs.   Family History:   family history includes Heart disease in her mother; Other in her father and sister.    Review of Systems: Review of Systems  Constitutional: Negative.   HENT: Negative.    Respiratory: Negative.    Cardiovascular: Negative.   Gastrointestinal: Negative.   Musculoskeletal: Negative.   Neurological: Negative.   Psychiatric/Behavioral: Negative.    All other systems reviewed and are negative.  PHYSICAL EXAM: VS:  BP 140/62 (BP Location: Left Arm, Patient Position: Sitting, Cuff Size: Normal)    Pulse 64    Ht 5\' 4"  (1.626 m)    Wt 95 lb (43.1 kg)    SpO2 98%    BMI 16.31 kg/m  , BMI Body mass index is 16.31 kg/m. Constitutional:  oriented to person, place, and time. No distress.  Thin HENT:  Head: Grossly normal Eyes:  no discharge. No scleral icterus.  Neck: No JVD, no carotid bruits  Cardiovascular: Regular rate and rhythm, no murmurs appreciated Pulmonary/Chest: Clear to auscultation bilaterally, no wheezes or rails Abdominal: Soft.  no distension.  no tenderness.  Musculoskeletal: Normal range of motion Neurological:  normal muscle tone. Coordination normal. No atrophy Skin: Skin warm and dry Psychiatric: normal affect, pleasant  Recent Labs: 09/06/2020: ALT 23; BUN 28; Creatinine, Ser 0.99; Hemoglobin 13.8; Hemoglobin 13.8; Platelets 295; Platelets 308; Potassium  4.2; Sodium 135    Lipid Panel No results found for: CHOL, HDL, LDLCALC, TRIG    Wt Readings from Last 3 Encounters:  02/27/21 95 lb (43.1 kg)  12/21/20 108 lb 4.8 oz (49.1 kg)  09/06/20 85 lb (38.6 kg)     ASSESSMENT AND PLAN:  Problem List Items Addressed This Visit       Cardiology Problems   Paroxysmal atrial fibrillation (HCC) - Primary   Relevant Medications   TIADYLT ER 300 MG 24 hr capsule   Other Visit Diagnoses     Essential hypertension       Relevant Medications   TIADYLT ER 300 MG 24 hr capsule     Paroxysmal atrial fibrillation Long discussion concerning prior history of atrial fibrillation Lone atrial fibrillation episode in 2020 High fall risk, numerous falls over the past 6 months.  Always needs to walk with assistance We have recommended she hold the Eliquis Continue current dose metoprolol, diltiazem extended release 300 Family reports  they are monitoring blood pressure closely at [facility]  Gait instability Prior falls, History of fall and fracture pelvis Long discussion with family  Essential hypertension Blood pressure is well controlled on today's visit. No changes made to the medications.    Total encounter time more than 30 minutes  Greater than 50% was spent in counseling and coordination of care with the patient    Signed, Dossie Arbour, M.D., Ph.D. [facility] [place], [place] [phone]

## 2021-02-27 NOTE — Patient Instructions (Addendum)
Medication Instructions:  Please stop eliquis when it runs out, do not refill  If you need a refill on your cardiac medications before your next appointment, please call your pharmacy.   Lab work: No new labs needed  Testing/Procedures: No new testing needed  Follow-Up: At [facility], you and your health needs are our priority.  As part of our continuing mission to provide you with exceptional heart care, we have created designated Provider Care Teams.  These Care Teams include your primary Cardiologist (physician) and Advanced Practice Providers (APPs -  Physician Assistants and Nurse Practitioners) who all work together to provide you with the care you need, when you need it.  You will need a follow up appointment in 6 months  Providers on your designated Care Team:   Nicolasa Ducking, NP Eula Listen, PA-C Cadence Fransico Michael, [place]  COVID-19 Vaccine Information can be found at: [email or web address] For questions related to vaccine distribution or appointments, please email [email or web address] or call [phone].

## 2021-07-20 ENCOUNTER — Encounter: Payer: Self-pay | Admitting: Internal Medicine

## 2021-07-20 ENCOUNTER — Other Ambulatory Visit: Payer: Self-pay

## 2021-07-20 ENCOUNTER — Emergency Department: Payer: Medicare Other

## 2021-07-20 ENCOUNTER — Observation Stay
Admission: EM | Admit: 2021-07-20 | Discharge: 2021-07-21 | Disposition: A | Discharge status: Home / Self Care | Payer: Medicare Other | Attending: Internal Medicine | Admitting: Internal Medicine

## 2021-07-20 DIAGNOSIS — K219 Gastro-esophageal reflux disease without esophagitis: Secondary | ICD-10-CM | POA: Insufficient documentation

## 2021-07-20 DIAGNOSIS — I482 Chronic atrial fibrillation, unspecified: Secondary | ICD-10-CM | POA: Diagnosis present

## 2021-07-20 DIAGNOSIS — N179 Acute kidney failure, unspecified: Secondary | ICD-10-CM | POA: Diagnosis not present

## 2021-07-20 DIAGNOSIS — K529 Noninfective gastroenteritis and colitis, unspecified: Secondary | ICD-10-CM

## 2021-07-20 DIAGNOSIS — A0811 Acute gastroenteropathy due to Norwalk agent: Secondary | ICD-10-CM | POA: Diagnosis not present

## 2021-07-20 DIAGNOSIS — R197 Diarrhea, unspecified: Secondary | ICD-10-CM

## 2021-07-20 DIAGNOSIS — K5730 Diverticulosis of large intestine without perforation or abscess without bleeding: Secondary | ICD-10-CM | POA: Insufficient documentation

## 2021-07-20 DIAGNOSIS — I5032 Chronic diastolic (congestive) heart failure: Secondary | ICD-10-CM | POA: Diagnosis not present

## 2021-07-20 DIAGNOSIS — N200 Calculus of kidney: Secondary | ICD-10-CM | POA: Insufficient documentation

## 2021-07-20 DIAGNOSIS — N1831 Chronic kidney disease, stage 3a: Secondary | ICD-10-CM | POA: Diagnosis not present

## 2021-07-20 DIAGNOSIS — E860 Dehydration: Secondary | ICD-10-CM | POA: Insufficient documentation

## 2021-07-20 DIAGNOSIS — R112 Nausea with vomiting, unspecified: Secondary | ICD-10-CM | POA: Diagnosis present

## 2021-07-20 DIAGNOSIS — Z79899 Other long term (current) drug therapy: Secondary | ICD-10-CM | POA: Insufficient documentation

## 2021-07-20 DIAGNOSIS — I4819 Other persistent atrial fibrillation: Secondary | ICD-10-CM | POA: Diagnosis not present

## 2021-07-20 DIAGNOSIS — E876 Hypokalemia: Secondary | ICD-10-CM | POA: Diagnosis not present

## 2021-07-20 DIAGNOSIS — I700 Atherosclerosis of aorta: Secondary | ICD-10-CM | POA: Diagnosis not present

## 2021-07-20 DIAGNOSIS — I480 Paroxysmal atrial fibrillation: Secondary | ICD-10-CM

## 2021-07-20 DIAGNOSIS — I5A Non-ischemic myocardial injury (non-traumatic): Secondary | ICD-10-CM | POA: Diagnosis not present

## 2021-07-20 DIAGNOSIS — F32A Depression, unspecified: Secondary | ICD-10-CM | POA: Diagnosis not present

## 2021-07-20 DIAGNOSIS — I10 Essential (primary) hypertension: Secondary | ICD-10-CM

## 2021-07-20 DIAGNOSIS — I130 Hypertensive heart and chronic kidney disease with heart failure and stage 1 through stage 4 chronic kidney disease, or unspecified chronic kidney disease: Secondary | ICD-10-CM | POA: Insufficient documentation

## 2021-07-20 LAB — CBC
HCT: 43.8 % (ref 36.0–46.0)
HCT: 40.3 % (ref 36.0–46.0)
HCT: 37.8 % (ref 36.0–46.0)
HCT: 38.7 % (ref 36.0–46.0)
Hemoglobin: 14.4 g/dL (ref 12.0–15.0)
Hemoglobin: 13.1 g/dL (ref 12.0–15.0)
Hemoglobin: 12.7 g/dL (ref 12.0–15.0)
Hemoglobin: 12.5 g/dL (ref 12.0–15.0)
MCH: 31.2 pg (ref 26.0–34.0)
MCH: 31.4 pg (ref 26.0–34.0)
MCH: 32.1 pg (ref 26.0–34.0)
MCH: 31.2 pg (ref 26.0–34.0)
MCHC: 32.9 g/dL (ref 30.0–36.0)
MCHC: 32.5 g/dL (ref 30.0–36.0)
MCHC: 33.6 g/dL (ref 30.0–36.0)
MCHC: 32.3 g/dL (ref 30.0–36.0)
MCV: 95 fL (ref 80.0–100.0)
MCV: 96.6 fL (ref 80.0–100.0)
MCV: 95.5 fL (ref 80.0–100.0)
MCV: 96.5 fL (ref 80.0–100.0)
Platelets: 263 10*3/uL (ref 150–400)
Platelets: 233 10*3/uL (ref 150–400)
Platelets: 221 10*3/uL (ref 150–400)
Platelets: 221 10*3/uL (ref 150–400)
RBC: 4.61 MIL/uL (ref 3.87–5.11)
RBC: 4.17 MIL/uL (ref 3.87–5.11)
RBC: 3.96 MIL/uL (ref 3.87–5.11)
RBC: 4.01 MIL/uL (ref 3.87–5.11)
RDW: 13.1 % (ref 11.5–15.5)
RDW: 13.2 % (ref 11.5–15.5)
RDW: 13.2 % (ref 11.5–15.5)
RDW: 13.2 % (ref 11.5–15.5)
WBC: 10.9 10*3/uL — ABNORMAL HIGH (ref 4.0–10.5)
WBC: 9.8 10*3/uL (ref 4.0–10.5)
WBC: 8.2 10*3/uL (ref 4.0–10.5)
WBC: 9.1 10*3/uL (ref 4.0–10.5)
nRBC: 0 % (ref 0.0–0.2)
nRBC: 0 % (ref 0.0–0.2)
nRBC: 0 % (ref 0.0–0.2)
nRBC: 0 % (ref 0.0–0.2)

## 2021-07-20 LAB — COMPREHENSIVE METABOLIC PANEL
ALT: 19 U/L (ref 0–44)
AST: 26 U/L (ref 15–41)
Albumin: 4.2 g/dL (ref 3.5–5.0)
Alkaline Phosphatase: 75 U/L (ref 38–126)
Anion gap: 12 (ref 5–15)
BUN: 42 mg/dL — ABNORMAL HIGH (ref 8–23)
CO2: 24 mmol/L (ref 22–32)
Calcium: 9.7 mg/dL (ref 8.9–10.3)
Chloride: 100 mmol/L (ref 98–111)
Creatinine, Ser: 1.53 mg/dL — ABNORMAL HIGH (ref 0.44–1.00)
GFR, Estimated: 31 mL/min — ABNORMAL LOW (ref >60)
Glucose, Bld: 153 mg/dL — ABNORMAL HIGH (ref 70–99)
Potassium: 3.3 mmol/L — ABNORMAL LOW (ref 3.5–5.1)
Sodium: 136 mmol/L (ref 135–145)
Total Bilirubin: 0.4 mg/dL (ref 0.3–1.2)
Total Protein: 7.3 g/dL (ref 6.5–8.1)

## 2021-07-20 LAB — TROPONIN I (HIGH SENSITIVITY)
Troponin I (High Sensitivity): 28 ng/L — ABNORMAL HIGH (ref <18)
Troponin I (High Sensitivity): 24 ng/L — ABNORMAL HIGH (ref <18)
Troponin I (High Sensitivity): 25 ng/L — ABNORMAL HIGH (ref <18)

## 2021-07-20 LAB — BRAIN NATRIURETIC PEPTIDE: B Natriuretic Peptide: 108.1 pg/mL — ABNORMAL HIGH (ref 0.0–100.0)

## 2021-07-20 LAB — CBG MONITORING, ED: Glucose-Capillary: 129 mg/dL — ABNORMAL HIGH (ref 70–99)

## 2021-07-20 LAB — PROTIME-INR
INR: 1.1 (ref 0.8–1.2)
Prothrombin Time: 14.5 seconds (ref 11.4–15.2)

## 2021-07-20 LAB — HEMOGLOBIN A1C
Hgb A1c MFr Bld: 5.6 % (ref 4.8–5.6)
Mean Plasma Glucose: 114.02 mg/dL

## 2021-07-20 LAB — MAGNESIUM: Magnesium: 2.4 mg/dL (ref 1.7–2.4)

## 2021-07-20 LAB — TYPE AND SCREEN
ABO/RH(D): O POS
Antibody Screen: NEGATIVE

## 2021-07-20 LAB — APTT: aPTT: 32 seconds (ref 24–36)

## 2021-07-20 LAB — LIPASE, BLOOD: Lipase: 28 U/L (ref 11–51)

## 2021-07-20 MED ORDER — METOPROLOL TARTRATE 25 MG PO TABS
12.5000 mg | ORAL_TABLET | Freq: Two times a day (BID) | ORAL | Status: DC
  Administered 2021-07-20 – 2021-07-21 (×2): 12.5 mg via ORAL
  Filled 2021-07-20 (×2): qty 1

## 2021-07-20 MED ORDER — MIRTAZAPINE 15 MG PO TABS
7.5000 mg | ORAL_TABLET | Freq: Every day | ORAL | Status: DC
  Administered 2021-07-20: 7.5 mg via ORAL
  Filled 2021-07-20: qty 1

## 2021-07-20 MED ORDER — ONDANSETRON HCL 4 MG/2ML IJ SOLN
4.0000 mg | Freq: Once | INTRAMUSCULAR | Status: AC
  Administered 2021-07-20: 4 mg via INTRAVENOUS
  Filled 2021-07-20: qty 2

## 2021-07-20 MED ORDER — POTASSIUM CHLORIDE CRYS ER 20 MEQ PO TBCR
40.0000 meq | EXTENDED_RELEASE_TABLET | Freq: Once | ORAL | Status: AC
  Administered 2021-07-20: 40 meq via ORAL
  Filled 2021-07-20: qty 2

## 2021-07-20 MED ORDER — ACETAMINOPHEN 325 MG PO TABS: 650.0000 mg | ORAL_TABLET | Freq: Four times a day (QID) | ORAL | Status: DC | PRN

## 2021-07-20 MED ORDER — SODIUM CHLORIDE 0.9 % IV SOLN: INTRAVENOUS | Status: DC

## 2021-07-20 MED ORDER — MAGNESIUM LACTATE 84 MG (7MEQ) PO TBCR
84.0000 mg | EXTENDED_RELEASE_TABLET | Freq: Every day | ORAL | Status: DC
  Administered 2021-07-21: 84 mg via ORAL
  Filled 2021-07-20: qty 1

## 2021-07-20 MED ORDER — HYDRALAZINE HCL 10 MG PO TABS
10.0000 mg | ORAL_TABLET | Freq: Three times a day (TID) | ORAL | Status: DC
  Administered 2021-07-20 – 2021-07-21 (×2): 10 mg via ORAL
  Filled 2021-07-20 (×2): qty 1

## 2021-07-20 MED ORDER — OYSTER SHELL CALCIUM/D3 500-5 MG-MCG PO TABS
1.0000 | ORAL_TABLET | Freq: Two times a day (BID) | ORAL | Status: DC
  Administered 2021-07-20 – 2021-07-21 (×2): 1 via ORAL
  Filled 2021-07-20 (×2): qty 1

## 2021-07-20 MED ORDER — DILTIAZEM HCL ER COATED BEADS 300 MG PO CP24
300.0000 mg | ORAL_CAPSULE | Freq: Every day | ORAL | Status: DC
  Administered 2021-07-21: 300 mg via ORAL
  Filled 2021-07-20: qty 1

## 2021-07-20 MED ORDER — MAGNESIUM SULFATE 2 GM/50ML IV SOLN
2.0000 g | Freq: Once | INTRAVENOUS | Status: AC
  Administered 2021-07-20: 2 g via INTRAVENOUS
  Filled 2021-07-20: qty 50

## 2021-07-20 MED ORDER — PANTOPRAZOLE SODIUM 40 MG PO TBEC
40.0000 mg | DELAYED_RELEASE_TABLET | Freq: Two times a day (BID) | ORAL | Status: DC
  Administered 2021-07-20 – 2021-07-21 (×3): 40 mg via ORAL
  Filled 2021-07-20 (×3): qty 1

## 2021-07-20 MED ORDER — HYDRALAZINE HCL 20 MG/ML IJ SOLN
5.0000 mg | INTRAMUSCULAR | Status: DC | PRN
Start: 2021-07-20 — End: 2021-07-21
  Administered 2021-07-20: 5 mg via INTRAVENOUS
  Filled 2021-07-20: qty 1

## 2021-07-20 MED ORDER — LACTATED RINGERS IV BOLUS
1000.0000 mL | Freq: Once | INTRAVENOUS | Status: AC
  Administered 2021-07-20: 1000 mL via INTRAVENOUS

## 2021-07-20 MED ORDER — POTASSIUM CHLORIDE 20 MEQ PO PACK
40.0000 meq | PACK | Freq: Once | ORAL | Status: AC
  Administered 2021-07-20: 40 meq via ORAL
  Filled 2021-07-20: qty 2

## 2021-07-20 MED ORDER — DIPHENHYDRAMINE HCL 50 MG/ML IJ SOLN: 12.5000 mg | Freq: Three times a day (TID) | INTRAMUSCULAR | Status: DC | PRN

## 2021-07-20 NOTE — Progress Notes (Signed)
Pt happy, no c/o pain, drinking water with son at bedside.

## 2021-07-20 NOTE — ED Notes (Signed)
Dr Clyde Lundborg notified that Ekg complete

## 2021-07-20 NOTE — Assessment & Plan Note (Addendum)
Baseline creatinine 0.99.  Her creatinine is 1.53, BUN 42, likely due to dehydration. -Avoid using renal toxic medications. - IV fluid as above. - Follow-up with BMP

## 2021-07-20 NOTE — Assessment & Plan Note (Signed)
-   Continue home medications 

## 2021-07-20 NOTE — Assessment & Plan Note (Addendum)
Likely due to viral gastroenteritis.  CT scan of abdomen/pelvis is negative for acute intra-abdominal issues.  Patient is dehydrated has worsening renal function. Patient reports tiny amount of blood in stool this morning.  Hemoglobin stable, 14.4.  -Placed on telemetry bed for observation -As needed Benadryl for nausea -IV fluid: 1 L LR, followed by 75 cc/h -Follow-up C. difficile test and GI pathogen panel -Protonix 40 mg twice daily -Follow-up CBC every 8 hours for hemoglobin level

## 2021-07-20 NOTE — Assessment & Plan Note (Signed)
On Protonix 

## 2021-07-20 NOTE — Assessment & Plan Note (Signed)
Myocardial injury due to gastroenteritis: Troponin level 28, 24, 25.  Currently no chest pain. -Trend troponin -Will not start aspirin due to blood in stool -Check A1c, FLP

## 2021-07-20 NOTE — ED Triage Notes (Signed)
Patient reports nausea vomiting and diarrhea for the past three days. States she has also had a small amount of blood in her stool this morning. Reports burning pain to abdomen, chest and rectum. AOX4. Resp even, unlabored on RA. Ambulatory with cane.

## 2021-07-20 NOTE — Assessment & Plan Note (Signed)
Potassium 3.3.  Patient was given 2 g of magnesium sulfate in the ED.  -Repleted potassium -Check magnesium level

## 2021-07-20 NOTE — H&P (Signed)
History and Physical    Vanessa Brock [id number] DOB: 1922/10/17 DOA: 07/20/2021  Referring MD/NP/PA:   PCP: [facility]   Patient coming from:  The patient is coming from SNF    Chief Complaint: nausea, vomiting and diarrhea  HPI: Vanessa Brock is a 88 y.o. female with medical history significant of atrial fibrillation not on anticoagulants, GERD, depression, dCHF, CKD-3A, who presents with nausea, vomiting, diarrhea.  Patient states that she has nausea, vomiting, diarrhea and abdominal pain for more than 3 days.  She she has 2 or 3 times of nonbilious nonbloody vomiting each day.  Patient has more than 10 times of watery diarrhea every day.  She has central abdominal pain, which is burning-like, moderate, nonradiating. She denies fever or chills.  Patient states that she noticed tiny amount of blood in the stool this morning.  Patient states she had chest pressure earlier, which has resolved.  Currently no active chest pain.  Does not have cough, shortness of breath.  No symptoms of UTI.  Data reviewed independently and ED Course: pt was found to have WBC 10.9, troponin level 28, 24, BNP 108, worsening renal function, potassium 3.3, temperature normal, blood pressure 135/61, heart rate 91, RR 19, oxygen saturation 95% on room air.  CT of abdomen/pelvis is negative for acute infarct abdominal issues, showed diarrhea related change.  Patient is placed on telemetry bed for observation.   EKG: I have personally reviewed.  Atrial fibrillation, QTc 511, bilateral fascicular block, early R wave progression.  The repeated EKG after giving potassium showed QTc 490.   Review of Systems:   General: no fevers, chills, no body weight gain, has poor appetite, has fatigue HEENT: no blurry vision, hearing changes or sore throat Respiratory: no dyspnea, coughing, wheezing CV: no chest pain, no palpitations GI: has nausea, vomiting, abdominal pain, diarrhea, no constipation GU: no  dysuria, burning on urination, increased urinary frequency, hematuria  Ext: no leg edema Neuro: no unilateral weakness, numbness, or tingling, no vision change or hearing loss Skin: no rash, no skin tear. MSK: No muscle spasm, no deformity, no limitation of range of movement in spin Heme: No easy bruising.  Travel history: No recent long distant travel.   Allergy:  Allergies  Allergen Reactions   Elemental Sulfur Hives   Sulfur Hives   Aspirin Nausea And Vomiting and Nausea Only    Past Medical History:  Diagnosis Date   AKI (acute kidney injury) ([place])    a. 02/2018 in setting of hip fx -->improved w/ IVF.   Closed displaced fracture of left femoral neck ([place])    a. 02/2018 s/p ORIF.   Diastolic dysfunction    a. 02/2018 Limited Echo: EF > 65%, impaired diast relaxation. Nl RV fxn.    GERD (gastroesophageal reflux disease)    Hypertension    Impaired fasting glucose    Persistent atrial fibrillation ([place])    a. 02/2018 Dx following ORIF L hip 2/2 fx; b. CHA2DS2VASc = 4-->Eliquis.    Past Surgical History:  Procedure Laterality Date   ABDOMINAL HYSTERECTOMY     CHOLECYSTECTOMY     HIP SURGERY  02/06/2018   INTRAMEDULLARY (IM) NAIL INTERTROCHANTERIC Left 02/06/2018   Procedure: INTRAMEDULLARY (IM) NAIL INTERTROCHANTRIC;  Surgeon: Dereck Leep, MD;  Location: [facility] ORS;  Service: Orthopedics;  Laterality: Left;    Social History:  reports that she has never smoked. She has never used smokeless tobacco. She reports that she does not drink alcohol and  does not use drugs.  Family History:  Family History  Problem Relation Age of Onset   Heart disease Mother        died @ 41 - ? heart issues   Other Father        died @ 30   Other Sister        53     Prior to Admission medications   Medication Sig Start Date End Date Taking? Authorizing Provider  acetaminophen (TYLENOL) 325 MG tablet Take 325 mg by mouth every 4 (four) hours as needed. 06/24/20   [provider]   Calcium Carb-Cholecalciferol (CALCIUM-VITAMIN D) 500-200 MG-UNIT tablet Take 1 tablet by mouth 2 (two) times daily.    [provider]  Docusate Sodium (DSS) 100 MG CAPS Take by mouth as needed.    [provider]  lidocaine (LIDODERM) 5 % Place 2 patches onto the skin daily. Apply to pelvis. Remove & Discard patch within 12 hours or as directed by MD Patient not taking: Reported on 08/26/2020 07/09/20   Medina-Vargas, Monina C, NP  metoprolol tartrate (LOPRESSOR) 25 MG tablet Take 1 tablet (25 mg total) by mouth 2 (two) times daily. 07/09/20   Medina-Vargas, Monina C, NP  mirtazapine (REMERON) 7.5 MG tablet Take 7.5 mg by mouth at bedtime. 02/06/21   [provider]  Multiple Vitamin (MULTIVITAMIN) capsule Take 1 capsule by mouth daily.    [provider]  omeprazole (PRILOSEC) 20 MG capsule Take 1 capsule (20 mg total) by mouth daily. 07/09/20   Medina-Vargas, Monina C, NP  ondansetron (ZOFRAN ODT) 4 MG disintegrating tablet Take 1 tablet (4 mg total) by mouth every 8 (eight) hours as needed for nausea or vomiting. Patient not taking: Reported on 02/27/2021 09/06/20   Duffy Bruce, MD  oxyCODONE (OXY IR/ROXICODONE) 5 MG immediate release tablet Take 1 tablet (5 mg total) by mouth every 4 (four) hours as needed for severe pain (pain score 7-10). Patient not taking: Reported on 08/26/2020 02/09/18   Reche Dixon, PA-C  TIADYLT ER 300 MG 24 hr capsule Take 300 mg by mouth daily. 02/06/21   [provider]  traMADol (ULTRAM) 50 MG tablet Take 1 tablet (50 mg total) by mouth every 4 (four) hours as needed for moderate pain (pain score 4-6). Patient not taking: Reported on 08/26/2020 02/09/18   Reche Dixon, PA-C    Physical Exam: Vitals:   07/20/21 0730 07/20/21 0828 07/20/21 1000 07/20/21 1030  BP: 137/64  (!) 155/65 (!) 150/75  Pulse: 60  73 70  Resp:   15 14  Temp:  97.7 F (36.5 C)    TempSrc:  Oral    SpO2: 94%  93% 96%  Weight:      Height:       General:  Not in acute distress. Dry mucous membrane HEENT:       Eyes: PERRL, EOMI, no scleral icterus.       ENT: No discharge from the ears and nose, no pharynx injection, no tonsillar enlargement.        Neck: No JVD, no bruit, no mass felt. Heme: No neck lymph node enlargement. Cardiac: S1/S2, RRR, No murmurs, No gallops or rubs. Respiratory: No rales, wheezing, rhonchi or rubs. GI: Soft, nondistended, has mild tenderness in central abdomen, no rebound pain, no organomegaly, BS present. GU: No hematuria Ext: No pitting leg edema bilaterally. 1+DP/PT pulse bilaterally. Musculoskeletal: No joint deformities, No joint redness or warmth, no limitation of ROM in spin. Skin:  No rashes.  Neuro: Alert, oriented X3, cranial nerves II-XII grossly intact, moves all extremities normally.  Psych: Patient is not psychotic, no suicidal or hemocidal ideation.  Labs on Admission: I have personally reviewed following labs and imaging studies  CBC: Recent Labs  Lab 07/20/21 0150 07/20/21 0814  WBC 10.9* 9.8  HGB 14.4 13.1  HCT 43.8 40.3  MCV 95.0 96.6  PLT 263 0000000   Basic Metabolic Panel: Recent Labs  Lab 07/20/21 0150  NA 136  K 3.3*  CL 100  CO2 24  GLUCOSE 153*  BUN 42*  CREATININE 1.53*  CALCIUM 9.7   GFR: Estimated Creatinine Clearance: 14.3 mL/min (A) (by C-G formula based on SCr of 1.53 mg/dL (H)). Liver Function Tests: Recent Labs  Lab 07/20/21 0150  AST 26  ALT 19  ALKPHOS 75  BILITOT 0.4  PROT 7.3  ALBUMIN 4.2   Recent Labs  Lab 07/20/21 0150  LIPASE 28   No results for input(s): "AMMONIA" in the last 168 hours. Coagulation Profile: Recent Labs  Lab 07/20/21 0814  INR 1.1   Cardiac Enzymes: No results for input(s): "CKTOTAL", "CKMB", "CKMBINDEX", "TROPONINI" in the last 168 hours. BNP (last 3 results) No results for input(s): "PROBNP" in the last 8760 hours. HbA1C: Recent Labs    07/20/21 0814  HGBA1C 5.6   CBG: Recent Labs  Lab 07/20/21 0952  GLUCAP  129*   Lipid Profile: No results for input(s): "CHOL", "HDL", "LDLCALC", "TRIG", "CHOLHDL", "LDLDIRECT" in the last 72 hours. Thyroid Function Tests: No results for input(s): "TSH", "T4TOTAL", "FREET4", "T3FREE", "THYROIDAB" in the last 72 hours. Anemia Panel: No results for input(s): "VITAMINB12", "FOLATE", "FERRITIN", "TIBC", "IRON", "RETICCTPCT" in the last 72 hours. Urine analysis:    Component Value Date/Time   COLORURINE YELLOW 09/06/2020 1536   APPEARANCEUR CLEAR 09/06/2020 1536   LABSPEC 1.020 09/06/2020 1536   PHURINE 7.0 09/06/2020 1536   GLUCOSEU NEGATIVE 09/06/2020 1536   HGBUR TRACE (A) 09/06/2020 1536   BILIRUBINUR NEGATIVE 09/06/2020 1536   KETONESUR NEGATIVE 09/06/2020 1536   PROTEINUR NEGATIVE 09/06/2020 1536   NITRITE NEGATIVE 09/06/2020 1536   LEUKOCYTESUR MODERATE (A) 09/06/2020 1536   Sepsis Labs: @LABRCNTIP (procalcitonin:4,lacticidven:4) )No results found for this or any previous visit (from the past 240 hour(s)).   Radiological Exams on Admission: CT Abdomen Pelvis Wo Contrast  Result Date: 07/20/2021 CLINICAL DATA:  Nausea and vomiting. EXAM: CT ABDOMEN AND PELVIS WITHOUT CONTRAST TECHNIQUE: Multidetector CT imaging of the abdomen and pelvis was performed following the standard protocol without IV contrast. RADIATION DOSE REDUCTION: This exam was performed according to the departmental dose-optimization program which includes automated exposure control, adjustment of the mA and/or kV according to patient size and/or use of iterative reconstruction technique. COMPARISON:  CT abdomen pelvis dated 09/06/2020. FINDINGS: Evaluation of this exam is limited in the absence of intravenous contrast. Lower chest: The visualized lung bases are clear. No intra-abdominal free air or free fluid. Hepatobiliary: The liver is unremarkable. No intrahepatic biliary dilatation. Cholecystectomy. There is dilatation of the common bile duct, likely post cholecystectomy. No retained  calcified stone noted in the central CBD. Pancreas: Unremarkable. No pancreatic ductal dilatation or surrounding inflammatory changes. Spleen: Normal in size without focal abnormality. Adrenals/Urinary Tract: The adrenal glands unremarkable. Mild bilateral renal parenchyma atrophy. Multiple nonobstructing left renal calculi measure up to 4 mm. There is no hydronephrosis on either side. Small bilateral renal cysts. The visualized ureters appear unremarkable the bladder is collapsed. Stomach/Bowel: There is sigmoid diverticulosis without active inflammatory  changes. Loose stool noted in the majority of the colon. There is no bowel obstruction or active inflammation. The appendix is not visualized with certainty. No inflammatory changes identified in the right lower quadrant. Vascular/Lymphatic: Moderate aortoiliac atherosclerotic disease. The IVC is unremarkable. No portal venous gas. There is no adenopathy. Reproductive: Hysterectomy.  No adnexal masses. Other: Small fat containing umbilical hernia. No fluid collection or inflammatory changes. Musculoskeletal: Osteopenia with degenerative changes spine. Status post prior ORIF of the left femoral neck. Old fracture of the left pubic bone as well as chronic appearing fragmentation of the right pubic bone adjacent to the symphysis previous. No acute osseous pathology. IMPRESSION: 1. Diarrheal state. Correlation with clinical exam and stool cultures recommended. 2. Sigmoid diverticulosis. No bowel obstruction. 3. Nonobstructing left renal calculi. No hydronephrosis. 4. Aortic Atherosclerosis (ICD10-I70.0). Electronically Signed   By: Elgie Collard M.D.   On: 07/20/2021 03:50      Assessment/Plan Principal Problem:   Nausea vomiting and diarrhea Active Problems:   Acute renal failure superimposed on stage 3a chronic kidney disease (HCC)   Chronic diastolic CHF (congestive heart failure) (HCC)   Atrial fibrillation, chronic (HCC)   Myocardial injury    Hypokalemia   Depression   GERD (gastroesophageal reflux disease)   Hypertension   Assessment and Plan: * Nausea vomiting and diarrhea Likely due to viral gastroenteritis.  CT scan of abdomen/pelvis is negative for acute intra-abdominal issues.  Patient is dehydrated has worsening renal function. Patient reports tiny amount of blood in stool this morning.  Hemoglobin stable, 14.4.  -Placed on telemetry bed for observation -As needed Benadryl for nausea -IV fluid: 1 L LR, followed by 75 cc/h -Follow-up C. difficile test and GI pathogen panel -Protonix 40 mg twice daily -Follow-up CBC every 8 hours for hemoglobin level   Acute renal failure superimposed on stage 3a chronic kidney disease (HCC) Baseline creatinine 0.99.  Her creatinine is 1.53, BUN 42, likely due to dehydration. -Avoid using renal toxic medications. - IV fluid as above. - Follow-up with BMP  Chronic diastolic CHF (congestive heart failure) (HCC) 2D echo on 02/09/2020 showed EF> 55%.  Patient does not have leg edema, clinically dry.  BNP 108. -Watch volume status closely  Atrial fibrillation, chronic (HCC)  CHA2DS2-VASc Score is 4.  Patient used to be on Eliquis which was discontinued by her cardiologist.  Heart rate 91, 61 -Telemetry monitoring -Continue diltiazem and metoprolol  Myocardial injury Myocardial injury due to gastroenteritis: Troponin level 28, 24, 25.  Currently no chest pain. -Trend troponin -Will not start aspirin due to blood in stool -Check A1c, FLP  Hypokalemia Potassium 3.3.  Patient was given 2 g of magnesium sulfate in the ED.  -Repleted potassium -Check magnesium level  Depression - Continue home medications  GERD (gastroesophageal reflux disease) - On Protonix  Hypertension - IV hydralazine as needed -Diltiazem, hydralazine, metoprolol          DVT ppx: SCD  Code Status: DNR per pt and her son  Family Communication:  Yes, patient's  son  at bed side.  Disposition  Plan:  Anticipate discharge back to previous environment, SNF  Consults called:  none  Admission status and Level of care: Med-Surg:   for obs     Severity of Illness:  The appropriate patient status for this patient is OBSERVATION. Observation status is judged to be reasonable and necessary in order to provide the required intensity of service to ensure the patient's safety. The patient's presenting symptoms, physical exam  findings, and initial radiographic and laboratory data in the context of their medical condition is felt to place them at decreased risk for further clinical deterioration. Furthermore, it is anticipated that the patient will be medically stable for discharge from the hospital within 2 midnights of admission.        Date of Service 07/20/2021    Lorretta Harp [facility]   If 7PM-7AM, please contact night-coverage [email or web address] 07/20/2021, 1:16 PM

## 2021-07-20 NOTE — Assessment & Plan Note (Signed)
CHA2DS2-VASc Score is 4.  Patient used to be on Eliquis which was discontinued by her cardiologist.  Heart rate 91, 61 -Telemetry monitoring -Continue diltiazem and metoprolol

## 2021-07-20 NOTE — Assessment & Plan Note (Signed)
-   IV hydralazine as needed -Diltiazem, hydralazine, metoprolol

## 2021-07-20 NOTE — ED Notes (Signed)
Pt ambulatory to restroom, new brief provided.

## 2021-07-20 NOTE — Assessment & Plan Note (Signed)
2D echo on 02/09/2020 showed EF> 55%.  Patient does not have leg edema, clinically dry.  BNP 108. -Watch volume status closely

## 2021-07-20 NOTE — ED Provider Notes (Signed)
[facility] Provider Note    Event Date/Time   First MD Initiated Contact with Patient 07/20/21 [phone]     (approximate)   History   Emesis, Diarrhea, and Abdominal Pain   HPI  Vanessa Brock is a 88 y.o. female who presents to the ED for evaluation of Emesis, Diarrhea, and Abdominal Pain   I reviewed cardiology clinic visit from 2/24.  History of HTN, GERD and postop A-fib after a femur fracture in 2020.  Takes Eliquis for stroke prevention. Resides at a local SNF and she and her daughter reports that there has been a stomach bug going around.  Patient presents to the ED for evaluation of 3 or 4 days of nausea, emesis and diarrhea.  Denies  abdominal pain, fevers, chest pain or syncope.  Reports recurrent diarrhea and "every time I sit down I just have to get up and go back to the bathroom."   Physical Exam   Triage Vital Signs: ED Triage Vitals  Enc Vitals Group     BP 07/20/21 0136 134/90     Pulse Rate 07/20/21 0136 91     Resp 07/20/21 0136 16     Temp 07/20/21 0136 98.4 F (36.9 C)     Temp Source 07/20/21 0136 Oral     SpO2 07/20/21 0136 95 %     Weight 07/20/21 0137 95 lb (43.1 kg)     Height 07/20/21 0137 5\' 3"  (1.6 m)     Head Circumference --      Peak Flow --      Pain Score 07/20/21 0137 5     Pain Loc --      Pain Edu? --      Excl. in GC? --     Most recent vital signs: Vitals:   07/20/21 0136 07/20/21 0600  BP: 134/90 (!) 148/74  Pulse: 91 71  Resp: 16 19  Temp: 98.4 F (36.9 C)   SpO2: 95% 98%    General: Awake, no distress.  Pleasant and conversational CV:  Good peripheral perfusion.  Resp:  Normal effort.  Abd:  No distention.  Soft and benign throughout MSK:  No deformity noted.  Neuro:  No focal deficits appreciated. Other:  Dry mucous membranes   ED Results / Procedures / Treatments   Labs (all labs ordered are listed, but only abnormal results are displayed) Labs Reviewed  COMPREHENSIVE METABOLIC PANEL -  Abnormal; Notable for the following components:      Result Value   Potassium 3.3 (*)    Glucose, Bld 153 (*)    BUN 42 (*)    Creatinine, Ser 1.53 (*)    GFR, Estimated 31 (*)    All other components within normal limits  CBC - Abnormal; Notable for the following components:   WBC 10.9 (*)    All other components within normal limits  TROPONIN I (HIGH SENSITIVITY) - Abnormal; Notable for the following components:   Troponin I (High Sensitivity) 28 (*)    All other components within normal limits  LIPASE, BLOOD  URINALYSIS, ROUTINE W REFLEX MICROSCOPIC  TROPONIN I (HIGH SENSITIVITY)    EKG Atrial fibrillation with a rate of 91 bpm.  Normal axis.  Right bundle.  No STEMI.  RADIOLOGY CT abdomen/pelvis interpreted by me without evidence of SBO  Official radiology report(s): CT Abdomen Pelvis Wo Contrast  Result Date: 07/20/2021 CLINICAL DATA:  Nausea and vomiting. EXAM: CT ABDOMEN AND PELVIS WITHOUT CONTRAST TECHNIQUE: Multidetector CT  imaging of the abdomen and pelvis was performed following the standard protocol without IV contrast. RADIATION DOSE REDUCTION: This exam was performed according to the departmental dose-optimization program which includes automated exposure control, adjustment of the mA and/or kV according to patient size and/or use of iterative reconstruction technique. COMPARISON:  CT abdomen pelvis dated 09/06/2020. FINDINGS: Evaluation of this exam is limited in the absence of intravenous contrast. Lower chest: The visualized lung bases are clear. No intra-abdominal free air or free fluid. Hepatobiliary: The liver is unremarkable. No intrahepatic biliary dilatation. Cholecystectomy. There is dilatation of the common bile duct, likely post cholecystectomy. No retained calcified stone noted in the central CBD. Pancreas: Unremarkable. No pancreatic ductal dilatation or surrounding inflammatory changes. Spleen: Normal in size without focal abnormality. Adrenals/Urinary Tract:  The adrenal glands unremarkable. Mild bilateral renal parenchyma atrophy. Multiple nonobstructing left renal calculi measure up to 4 mm. There is no hydronephrosis on either side. Small bilateral renal cysts. The visualized ureters appear unremarkable the bladder is collapsed. Stomach/Bowel: There is sigmoid diverticulosis without active inflammatory changes. Loose stool noted in the majority of the colon. There is no bowel obstruction or active inflammation. The appendix is not visualized with certainty. No inflammatory changes identified in the right lower quadrant. Vascular/Lymphatic: Moderate aortoiliac atherosclerotic disease. The IVC is unremarkable. No portal venous gas. There is no adenopathy. Reproductive: Hysterectomy.  No adnexal masses. Other: Small fat containing umbilical hernia. No fluid collection or inflammatory changes. Musculoskeletal: Osteopenia with degenerative changes spine. Status post prior ORIF of the left femoral neck. Old fracture of the left pubic bone as well as chronic appearing fragmentation of the right pubic bone adjacent to the symphysis previous. No acute osseous pathology. IMPRESSION: 1. Diarrheal state. Correlation with clinical exam and stool cultures recommended. 2. Sigmoid diverticulosis. No bowel obstruction. 3. Nonobstructing left renal calculi. No hydronephrosis. 4. Aortic Atherosclerosis (ICD10-I70.0). Electronically Signed   By: Elgie Collard M.D.   On: 07/20/2021 03:50    PROCEDURES and INTERVENTIONS:  .1-3 Lead EKG Interpretation  Performed by: Delton Prairie, MD Authorized by: Delton Prairie, MD     Interpretation: normal     ECG rate:  86   ECG rate assessment: normal     Rhythm: sinus rhythm     Ectopy: none     Conduction: normal     Medications  magnesium sulfate IVPB 2 g 50 mL (has no administration in time range)  potassium chloride (KLOR-CON) packet 40 mEq (has no administration in time range)  lactated ringers bolus 1,000 mL (1,000 mLs  Intravenous New Bag/Given 07/20/21 0600)  ondansetron (ZOFRAN) injection 4 mg (4 mg Intravenous Given 07/20/21 0603)     IMPRESSION / MDM / ASSESSMENT AND PLAN / ED COURSE  I reviewed the triage vital signs and the nursing notes.  Differential diagnosis includes, but is not limited to, gastroenteritis, small bowel obstruction, acute cystitis, pancreatitis  {Patient presents with symptoms of an acute illness or injury that is potentially life-threatening.  Pleasant 88 year old female presents to the ED with evidence of mild dehydration and AKI likely due to gastroenteritis requiring medical admission.  She is reassured examination without evidence of abdominal tenderness or peritoneal features.  Dry mucous membranes are noted.  Blood work with AKI on CKD.  CBC with minimal leukocytosis, no evidence of blood loss anemia.  Normal lipase.  Troponin is marginally elevated and we will send for a repeat.  She has no chest pain.  CT as above.  We will provide IV rehydration,  assess urinalysis for signs of acute cystitis and plan for observation admission considering her associated AKI.     FINAL CLINICAL IMPRESSION(S) / ED DIAGNOSES   Final diagnoses:  Nausea vomiting and diarrhea  Gastroenteritis  Dehydration  AKI (acute kidney injury) (HCC)  Hypokalemia     Rx / DC Orders   ED Discharge Orders     None        Note:  This document was prepared using Dragon voice recognition software and may include unintentional dictation errors.   Delton Prairie, MD 07/20/21 [phone]

## 2021-07-21 DIAGNOSIS — A0811 Acute gastroenteropathy due to Norwalk agent: Secondary | ICD-10-CM | POA: Diagnosis not present

## 2021-07-21 DIAGNOSIS — R197 Diarrhea, unspecified: Secondary | ICD-10-CM | POA: Diagnosis not present

## 2021-07-21 DIAGNOSIS — R112 Nausea with vomiting, unspecified: Secondary | ICD-10-CM | POA: Diagnosis not present

## 2021-07-21 LAB — GASTROINTESTINAL PANEL BY PCR, STOOL (REPLACES STOOL CULTURE)

## 2021-07-21 LAB — BASIC METABOLIC PANEL
Anion gap: 1 — ABNORMAL LOW (ref 5–15)
BUN: 24 mg/dL — ABNORMAL HIGH (ref 8–23)
CO2: 24 mmol/L (ref 22–32)
Calcium: 8.5 mg/dL — ABNORMAL LOW (ref 8.9–10.3)
Chloride: 114 mmol/L — ABNORMAL HIGH (ref 98–111)
Creatinine, Ser: 1.01 mg/dL — ABNORMAL HIGH (ref 0.44–1.00)
GFR, Estimated: 51 mL/min — ABNORMAL LOW (ref >60)
Glucose, Bld: 92 mg/dL (ref 70–99)
Potassium: 4.4 mmol/L (ref 3.5–5.1)
Sodium: 139 mmol/L (ref 135–145)

## 2021-07-21 LAB — C DIFFICILE QUICK SCREEN W PCR REFLEX
C Diff antigen: NEGATIVE
C Diff interpretation: NOT DETECTED
C Diff toxin: NEGATIVE

## 2021-07-21 LAB — LIPID PANEL
Cholesterol: 133 mg/dL (ref 0–200)
HDL: 27 mg/dL — ABNORMAL LOW (ref >40)
LDL Cholesterol: 86 mg/dL (ref 0–99)
Total CHOL/HDL Ratio: 4.9 RATIO
Triglycerides: 100 mg/dL (ref <150)
VLDL: 20 mg/dL (ref 0–40)

## 2021-07-21 LAB — GLUCOSE, CAPILLARY: Glucose-Capillary: 100 mg/dL — ABNORMAL HIGH (ref 70–99)

## 2021-07-21 MED ORDER — METOPROLOL TARTRATE 25 MG PO TABS: 12.5000 mg | ORAL_TABLET | Freq: Two times a day (BID) | ORAL | 0 refills | Status: DC

## 2021-07-21 NOTE — Plan of Care (Signed)

## 2021-07-21 NOTE — Discharge Summary (Signed)
Physician Discharge Summary   Patient: Vanessa Brock MRN: [id number] DOB: Mar 07, 1922  Admit date:     07/20/2021  Discharge date: 07/21/21  Discharge Physician: Fritzi Mandes   PCP: [facility]   Recommendations at discharge:    F/u PC in 1-2 weeks  Discharge Diagnoses: Acute gastroenteritis suspected due to Norovirus  Hospital Course: Vanessa Brock is a 88 y.o. female with medical history significant of atrial fibrillation not on anticoagulants, GERD, depression, dCHF, CKD-3A, who presents with nausea, vomiting, diarrhea.  Patient states that she has nausea, vomiting, diarrhea and abdominal pain for more than 3 days.  She she has 2 or 3 times of nonbilious nonbloody vomiting each day.  Nausea vomiting and diarrhea Likely due to viral gastroenteritis--Stool PCR positive for NOROVIRUS --  CT scan of abdomen/pelvis is negative for acute intra-abdominal issues.  --Patient is dehydrated has worsening renal function. Patient reports tiny amount of blood in stool this morning.  Hemoglobin stable, 14.4.  --C diff negative --overall improving. Very small mushy BM this am -tolerating po diet     Acute renal failure superimposed on stage 3a chronic kidney disease (HCC) Baseline creatinine 0.99.  Her creatinine is 1.53--1.0, likely due to dehydration. -Avoid using renal toxic medications. --recieved IVF   Chronic diastolic CHF (congestive heart failure) ([place]) 2D echo on 02/09/2020 showed EF> 55%.  Patient does not have leg edema, clinically dry.  BNP 108. -Watch volume status closely   Atrial fibrillation, chronic (HCC)  CHA2DS2-VASc Score is 4.  Patient used to be on Eliquis which was discontinued by her cardiologist.   --Continue diltiazem and metoprolol   Myocardial injury due to gastroenteritis: Troponin level 28, 24, 25.  Currently no chest pain.   Hypokalemia Potassium 3.3.  Patient was given 2 g of magnesium sulfate in the ED.  -Repleted potassium - magnesium  level--2.4   Depression - Continue home medications   GERD (gastroesophageal reflux disease) - On Protonix   Hypertension -Diltiazem, hydralazine, metoprolol  overall hemodynamically stable. Patient feels well. Will discharge her to home. Discussed with patient and son at bedside. Both are in agreement.          Disposition: Assisted living Diet recommendation:  Discharge Diet Orders (From admission, onward)     Start     Ordered   07/21/21 0000  Diet - low sodium heart healthy        07/21/21 1020           Cardiac diet DISCHARGE MEDICATION: Allergies as of 07/21/2021       Reactions   Elemental Sulfur Hives   Sulfur Hives   Aspirin Nausea And Vomiting, Nausea Only        Medication List     STOP taking these medications    acetaminophen 325 MG tablet Commonly known as: TYLENOL   lidocaine 5 % Commonly known as: Lidoderm   multivitamin capsule   oxyCODONE 5 MG immediate release tablet Commonly known as: Oxy IR/ROXICODONE   traMADol 50 MG tablet Commonly known as: ULTRAM       TAKE these medications    calcium-vitamin D 500-200 MG-UNIT tablet Take 1 tablet by mouth 2 (two) times daily.   DSS 100 MG Caps Take 1 capsule by mouth 2 (two) times daily as needed.   hydrALAZINE 10 MG tablet Commonly known as: APRESOLINE Take 10 mg by mouth 3 (three) times daily.   loperamide 2 MG capsule Commonly known as: IMODIUM Take 4 mg by mouth  as needed for diarrhea or loose stools.   Magnesium Glycinate 100 MG Caps Take 2 capsules by mouth daily.   metoprolol tartrate 25 MG tablet Commonly known as: LOPRESSOR Take 0.5 tablets (12.5 mg total) by mouth 2 (two) times daily.   mirtazapine 7.5 MG tablet Commonly known as: REMERON Take 7.5 mg by mouth at bedtime.   omeprazole 20 MG capsule Commonly known as: PRILOSEC Take 1 capsule (20 mg total) by mouth daily.   ondansetron 4 MG disintegrating tablet Commonly known as: Zofran ODT Take 1  tablet (4 mg total) by mouth every 8 (eight) hours as needed for nausea or vomiting.   phenylephrine-shark liver oil-mineral oil-petrolatum 0.25-14-74.9 % rectal ointment Commonly known as: PREPARATION H Place 1 Application rectally every 6 (six) hours as needed for hemorrhoids.   polyethylene glycol 17 g packet Commonly known as: MIRALAX / GLYCOLAX Take 17 g by mouth daily. Notes to patient: Start taking after 3-4 days   senna 8.6 MG tablet Commonly known as: SENOKOT Take 1 tablet by mouth daily. Notes to patient: Start taking after your bowels are reg consistency   Tiadylt ER 300 MG 24 hr capsule Generic drug: diltiazem Take 300 mg by mouth daily. What changed: Another medication with the same name was removed. Continue taking this medication, and follow the directions you see here.        Discharge Exam: Filed Weights   07/20/21 0137 07/21/21 0433  Weight: 43.1 kg 44.7 kg     Condition at discharge: fair  The results of significant diagnostics from this hospitalization (including imaging, microbiology, ancillary and laboratory) are listed below for reference.   Imaging Studies: CT Abdomen Pelvis Wo Contrast  Result Date: 07/20/2021 CLINICAL DATA:  Nausea and vomiting. EXAM: CT ABDOMEN AND PELVIS WITHOUT CONTRAST TECHNIQUE: Multidetector CT imaging of the abdomen and pelvis was performed following the standard protocol without IV contrast. RADIATION DOSE REDUCTION: This exam was performed according to the departmental dose-optimization program which includes automated exposure control, adjustment of the mA and/or kV according to patient size and/or use of iterative reconstruction technique. COMPARISON:  CT abdomen pelvis dated 09/06/2020. FINDINGS: Evaluation of this exam is limited in the absence of intravenous contrast. Lower chest: The visualized lung bases are clear. No intra-abdominal free air or free fluid. Hepatobiliary: The liver is unremarkable. No intrahepatic  biliary dilatation. Cholecystectomy. There is dilatation of the common bile duct, likely post cholecystectomy. No retained calcified stone noted in the central CBD. Pancreas: Unremarkable. No pancreatic ductal dilatation or surrounding inflammatory changes. Spleen: Normal in size without focal abnormality. Adrenals/Urinary Tract: The adrenal glands unremarkable. Mild bilateral renal parenchyma atrophy. Multiple nonobstructing left renal calculi measure up to 4 mm. There is no hydronephrosis on either side. Small bilateral renal cysts. The visualized ureters appear unremarkable the bladder is collapsed. Stomach/Bowel: There is sigmoid diverticulosis without active inflammatory changes. Loose stool noted in the majority of the colon. There is no bowel obstruction or active inflammation. The appendix is not visualized with certainty. No inflammatory changes identified in the right lower quadrant. Vascular/Lymphatic: Moderate aortoiliac atherosclerotic disease. The IVC is unremarkable. No portal venous gas. There is no adenopathy. Reproductive: Hysterectomy.  No adnexal masses. Other: Small fat containing umbilical hernia. No fluid collection or inflammatory changes. Musculoskeletal: Osteopenia with degenerative changes spine. Status post prior ORIF of the left femoral neck. Old fracture of the left pubic bone as well as chronic appearing fragmentation of the right pubic bone adjacent to the symphysis previous. No acute  osseous pathology. IMPRESSION: 1. Diarrheal state. Correlation with clinical exam and stool cultures recommended. 2. Sigmoid diverticulosis. No bowel obstruction. 3. Nonobstructing left renal calculi. No hydronephrosis. 4. Aortic Atherosclerosis (ICD10-I70.0). Electronically Signed   By: Elgie Collard M.D.   On: 07/20/2021 03:50    Microbiology: Results for orders placed or performed during the hospital encounter of 07/20/21  C Difficile Quick Screen w PCR reflex     Status: None   Collection  Time: 07/21/21  8:24 AM   Specimen: STOOL  Result Value Ref Range Status   C Diff antigen NEGATIVE NEGATIVE Final   C Diff toxin NEGATIVE NEGATIVE Final   C Diff interpretation No C. difficile detected.  Final    Comment: Performed at [facility], [address] [place], [place] [address]  Gastrointestinal Panel by PCR , Stool     Status: Abnormal   Collection Time: 07/21/21  8:24 AM   Specimen: Stool  Result Value Ref Range Status   Campylobacter species NOT DETECTED NOT DETECTED Final   Plesimonas shigelloides NOT DETECTED NOT DETECTED Final   Salmonella species NOT DETECTED NOT DETECTED Final   Yersinia enterocolitica NOT DETECTED NOT DETECTED Final   Vibrio species NOT DETECTED NOT DETECTED Final   Vibrio cholerae NOT DETECTED NOT DETECTED Final   Enteroaggregative E coli (EAEC) NOT DETECTED NOT DETECTED Final   Enteropathogenic E coli (EPEC) NOT DETECTED NOT DETECTED Final   Enterotoxigenic E coli (ETEC) NOT DETECTED NOT DETECTED Final   Shiga like toxin producing E coli (STEC) NOT DETECTED NOT DETECTED Final   Shigella/Enteroinvasive E coli (EIEC) NOT DETECTED NOT DETECTED Final   Cryptosporidium NOT DETECTED NOT DETECTED Final   Cyclospora cayetanensis NOT DETECTED NOT DETECTED Final   Entamoeba histolytica NOT DETECTED NOT DETECTED Final   Giardia lamblia NOT DETECTED NOT DETECTED Final   Adenovirus F40/41 NOT DETECTED NOT DETECTED Final   Astrovirus NOT DETECTED NOT DETECTED Final   Norovirus GI/GII DETECTED (A) NOT DETECTED Final    Comment: RESULT CALLED TO, READ BACK BY AND VERIFIED WITH: BETH B. RN 1000 07/21/21 HNM    Rotavirus A NOT DETECTED NOT DETECTED Final   Sapovirus (I, II, IV, and V) NOT DETECTED NOT DETECTED Final    Comment: Performed at [facility], [address] [place], [place] [address]    Labs: CBC: Recent Labs  Lab 07/20/21 0150 07/20/21 0814 07/20/21 1843 07/20/21 2329  WBC 10.9* 9.8 8.2 9.1  HGB 14.4 13.1 12.7 12.5   HCT 43.8 40.3 37.8 38.7  MCV 95.0 96.6 95.5 96.5  PLT 263 233 221 221   Basic Metabolic Panel: Recent Labs  Lab 07/20/21 0150 07/20/21 0616 07/21/21 0416  NA 136  --  139  K 3.3*  --  4.4  CL 100  --  114*  CO2 24  --  24  GLUCOSE 153*  --  92  BUN 42*  --  24*  CREATININE 1.53*  --  1.01*  CALCIUM 9.7  --  8.5*  MG  --  2.4  --    Liver Function Tests: Recent Labs  Lab 07/20/21 0150  AST 26  ALT 19  ALKPHOS 75  BILITOT 0.4  PROT 7.3  ALBUMIN 4.2   CBG: Recent Labs  Lab 07/20/21 0952 07/21/21 0752  GLUCAP 129* 100*    Discharge time spent: greater than 30 minutes.  Signed: Enedina Finner, MD [facility] 07/21/2021

## 2021-07-21 NOTE — Progress Notes (Signed)
Reviewed discharge instructions with pt and son. Pt and son verbalized understanding. IV intact when removed. Staff Wheeled pt out. Pt transported back to [facility] via family private vehicle.

## 2021-07-21 NOTE — Discharge Instructions (Signed)
Keep yourself hydrated 

## 2021-08-26 ENCOUNTER — Other Ambulatory Visit: Payer: Self-pay

## 2021-08-26 ENCOUNTER — Inpatient Hospital Stay
Admission: EM | Admit: 2021-08-26 | Discharge: 2021-08-31 | DRG: 481 | Disposition: A | Discharge status: Skilled Nursing Facility | Payer: Medicare Other | Attending: Internal Medicine | Admitting: Internal Medicine

## 2021-08-26 ENCOUNTER — Encounter: Payer: Self-pay | Admitting: Emergency Medicine

## 2021-08-26 ENCOUNTER — Emergency Department: Payer: Medicare Other

## 2021-08-26 DIAGNOSIS — R7301 Impaired fasting glucose: Secondary | ICD-10-CM | POA: Diagnosis present

## 2021-08-26 DIAGNOSIS — S7221XA Displaced subtrochanteric fracture of right femur, initial encounter for closed fracture: Principal | ICD-10-CM | POA: Diagnosis present

## 2021-08-26 DIAGNOSIS — S72002A Fracture of unspecified part of neck of left femur, initial encounter for closed fracture: Secondary | ICD-10-CM | POA: Diagnosis present

## 2021-08-26 DIAGNOSIS — Z90710 Acquired absence of both cervix and uterus: Secondary | ICD-10-CM

## 2021-08-26 DIAGNOSIS — R296 Repeated falls: Secondary | ICD-10-CM | POA: Diagnosis present

## 2021-08-26 DIAGNOSIS — R54 Age-related physical debility: Secondary | ICD-10-CM | POA: Diagnosis present

## 2021-08-26 DIAGNOSIS — W19XXXA Unspecified fall, initial encounter: Secondary | ICD-10-CM | POA: Diagnosis present

## 2021-08-26 DIAGNOSIS — D72829 Elevated white blood cell count, unspecified: Secondary | ICD-10-CM | POA: Diagnosis present

## 2021-08-26 DIAGNOSIS — S72002D Fracture of unspecified part of neck of left femur, subsequent encounter for closed fracture with routine healing: Secondary | ICD-10-CM | POA: Diagnosis not present

## 2021-08-26 DIAGNOSIS — I5032 Chronic diastolic (congestive) heart failure: Secondary | ICD-10-CM | POA: Diagnosis present

## 2021-08-26 DIAGNOSIS — W1830XA Fall on same level, unspecified, initial encounter: Secondary | ICD-10-CM | POA: Diagnosis present

## 2021-08-26 DIAGNOSIS — D62 Acute posthemorrhagic anemia: Secondary | ICD-10-CM | POA: Diagnosis not present

## 2021-08-26 DIAGNOSIS — S72001A Fracture of unspecified part of neck of right femur, initial encounter for closed fracture: Principal | ICD-10-CM

## 2021-08-26 DIAGNOSIS — Z66 Do not resuscitate: Secondary | ICD-10-CM | POA: Diagnosis present

## 2021-08-26 DIAGNOSIS — E440 Moderate protein-calorie malnutrition: Secondary | ICD-10-CM | POA: Diagnosis present

## 2021-08-26 DIAGNOSIS — N179 Acute kidney failure, unspecified: Secondary | ICD-10-CM | POA: Diagnosis not present

## 2021-08-26 DIAGNOSIS — K219 Gastro-esophageal reflux disease without esophagitis: Secondary | ICD-10-CM | POA: Diagnosis present

## 2021-08-26 DIAGNOSIS — I480 Paroxysmal atrial fibrillation: Secondary | ICD-10-CM | POA: Diagnosis present

## 2021-08-26 DIAGNOSIS — Z8249 Family history of ischemic heart disease and other diseases of the circulatory system: Secondary | ICD-10-CM | POA: Diagnosis not present

## 2021-08-26 DIAGNOSIS — I110 Hypertensive heart disease with heart failure: Secondary | ICD-10-CM | POA: Diagnosis present

## 2021-08-26 DIAGNOSIS — I4819 Other persistent atrial fibrillation: Secondary | ICD-10-CM | POA: Diagnosis present

## 2021-08-26 LAB — CBC WITH DIFFERENTIAL/PLATELET
Abs Immature Granulocytes: 0.15 10*3/uL — ABNORMAL HIGH (ref 0.00–0.07)
Basophils Absolute: 0.1 10*3/uL (ref 0.0–0.1)
Basophils Relative: 1 %
Eosinophils Absolute: 0.2 10*3/uL (ref 0.0–0.5)
Eosinophils Relative: 1 %
HCT: 37.4 % (ref 36.0–46.0)
Hemoglobin: 12.2 g/dL (ref 12.0–15.0)
Immature Granulocytes: 1 %
Lymphocytes Relative: 15 %
Lymphs Abs: 2.8 10*3/uL (ref 0.7–4.0)
MCH: 31.4 pg (ref 26.0–34.0)
MCHC: 32.6 g/dL (ref 30.0–36.0)
MCV: 96.1 fL (ref 80.0–100.0)
Monocytes Absolute: 1.2 10*3/uL — ABNORMAL HIGH (ref 0.1–1.0)
Monocytes Relative: 6 %
Neutro Abs: 14.5 10*3/uL — ABNORMAL HIGH (ref 1.7–7.7)
Neutrophils Relative %: 76 %
Platelets: 260 10*3/uL (ref 150–400)
RBC: 3.89 MIL/uL (ref 3.87–5.11)
RDW: 12.9 % (ref 11.5–15.5)
WBC: 19 10*3/uL — ABNORMAL HIGH (ref 4.0–10.5)
nRBC: 0 % (ref 0.0–0.2)

## 2021-08-26 LAB — BASIC METABOLIC PANEL
Anion gap: 9 (ref 5–15)
BUN: 27 mg/dL — ABNORMAL HIGH (ref 8–23)
CO2: 25 mmol/L (ref 22–32)
Calcium: 8.7 mg/dL — ABNORMAL LOW (ref 8.9–10.3)
Chloride: 103 mmol/L (ref 98–111)
Creatinine, Ser: 1.14 mg/dL — ABNORMAL HIGH (ref 0.44–1.00)
GFR, Estimated: 44 mL/min — ABNORMAL LOW (ref >60)
Glucose, Bld: 181 mg/dL — ABNORMAL HIGH (ref 70–99)
Potassium: 3.8 mmol/L (ref 3.5–5.1)
Sodium: 137 mmol/L (ref 135–145)

## 2021-08-26 MED ORDER — MORPHINE SULFATE (PF) 2 MG/ML IV SOLN
0.5000 mg | INTRAVENOUS | Status: DC | PRN
  Administered 2021-08-27 (×2): 0.5 mg via INTRAVENOUS
  Filled 2021-08-26 (×2): qty 1

## 2021-08-26 MED ORDER — DOCUSATE SODIUM 100 MG PO CAPS
100.0000 mg | ORAL_CAPSULE | Freq: Two times a day (BID) | ORAL | Status: DC
  Administered 2021-08-27 – 2021-08-30 (×8): 100 mg via ORAL
  Filled 2021-08-26 (×9): qty 1

## 2021-08-26 MED ORDER — HYDROCODONE-ACETAMINOPHEN 5-325 MG PO TABS: 1.0000 | ORAL_TABLET | Freq: Four times a day (QID) | ORAL | Status: DC | PRN

## 2021-08-26 MED ORDER — SENNOSIDES-DOCUSATE SODIUM 8.6-50 MG PO TABS: 1.0000 | ORAL_TABLET | Freq: Every evening | ORAL | Status: DC | PRN

## 2021-08-26 MED ORDER — SODIUM CHLORIDE 0.9 % IV SOLN: Freq: Once | INTRAVENOUS | Status: AC

## 2021-08-26 MED ORDER — FENTANYL CITRATE PF 50 MCG/ML IJ SOSY
50.0000 ug | PREFILLED_SYRINGE | Freq: Once | INTRAMUSCULAR | Status: AC
  Administered 2021-08-26: 50 ug via INTRAVENOUS
  Filled 2021-08-26: qty 1

## 2021-08-26 NOTE — H&P (Incomplete)
History and Physical    Vanessa Brock [id number] DOB: Jun 30, 1922 DOA: 08/26/2021  PCP: [facility]  Patient coming from: [facility]   I have personally briefly reviewed patient's old medical records in [facility]  Chief Complaint: mechanical  fall with pain right leg/hip  HPI: Vanessa Brock is a 88 y.o. female with medical history significant of  HTN, GERD, impaired fasting glucose, Afib not on anticoagulation, CHFpef, depression acute comminuted displaced and angulated proximal femoral fracture involves the lower trochanteric region and subtrochanteric Femur s/p repair which was complicated by new onset afib. Patient also has hx of admission on for gastro enteritis due to norovirus. Patient now  presents to ED s/p mechanical fall with associated complaint of injury to left leg with pain and noted deformity of leg. Patient on ros denies abdominal pain/ dysuria/ fever chills,sob/ chest pain n/v does note mild constipation. Notes pain in right leg.  ED Course:  On evaluation in ed patient vitals stable but patient left leg notably shorter than right and externally rotated. Xray of hip notable for hip fx  Xray hip 1. Acute comminuted displaced and angulated proximal femoral fracture involves the lower trochanteric region and subtrochanteric femur 2. Old left pubic rami fractures  Vitals: afeb, bp 160/59, hr 71 rr 18  sat 99% on ra Labs: Wbc: 19 hgb 12.2, pmn 14% Na 137, k 3.8, glu 181, cr 1.14 ( base 0.99) gfr 44 Cxr:Nad  HYW:VPXTG  2nd degree av block, rbb  Tx fentanyl   Review of Systems: As per HPI otherwise 10 point review of systems negative.   Past Medical History:  Diagnosis Date   AKI (acute kidney injury) (HCC)    a. 02/2018 in setting of hip fx -->improved w/ IVF.   Closed displaced fracture of left femoral neck (HCC)    a. 02/2018 s/p ORIF.   Diastolic dysfunction    a. 02/2018 Limited Echo: EF > 65%, impaired diast relaxation. Nl RV fxn.    GERD  (gastroesophageal reflux disease)    Hypertension    Impaired fasting glucose    Persistent atrial fibrillation (HCC)    a. 02/2018 Dx following ORIF L hip 2/2 fx; b. CHA2DS2VASc = 4-->Eliquis.    Past Surgical History:  Procedure Laterality Date   ABDOMINAL HYSTERECTOMY     CHOLECYSTECTOMY     HIP SURGERY  02/06/2018   INTRAMEDULLARY (IM) NAIL INTERTROCHANTERIC Left 02/06/2018   Procedure: INTRAMEDULLARY (IM) NAIL INTERTROCHANTRIC;  Surgeon: Donato Heinz, MD;  Location: [facility] ORS;  Service: Orthopedics;  Laterality: Left;     reports that she has never smoked. She has never used smokeless tobacco. She reports that she does not drink alcohol and does not use drugs.  Allergies  Allergen Reactions   Elemental Sulfur Hives   Sulfur Hives   Aspirin Nausea And Vomiting and Nausea Only    Family History  Problem Relation Age of Onset   Heart disease Mother        died @ 49 - ? heart issues   Other Father        died @ 50   Other Sister        5    Prior to Admission medications   Medication Sig Start Date End Date Taking? Authorizing Provider  Calcium Carb-Cholecalciferol (CALCIUM-VITAMIN D) 500-200 MG-UNIT tablet Take 1 tablet by mouth 2 (two) times daily.    [provider]  Docusate Sodium (DSS) 100 MG CAPS Take 1 capsule  by mouth 2 (two) times daily as needed.    [provider]  hydrALAZINE (APRESOLINE) 10 MG tablet Take 10 mg by mouth 3 (three) times daily. 07/07/21   [provider]  loperamide (IMODIUM) 2 MG capsule Take 4 mg by mouth as needed for diarrhea or loose stools.    [provider]  Magnesium Glycinate 100 MG CAPS Take 2 capsules by mouth daily.    [provider]  metoprolol tartrate (LOPRESSOR) 25 MG tablet Take 0.5 tablets (12.5 mg total) by mouth 2 (two) times daily. 07/21/21   Enedina Finner, MD  mirtazapine (REMERON) 7.5 MG tablet Take 7.5 mg by mouth at bedtime. 02/06/21   [provider]  omeprazole  (PRILOSEC) 20 MG capsule Take 1 capsule (20 mg total) by mouth daily. 07/09/20   Medina-Vargas, Monina C, NP  ondansetron (ZOFRAN ODT) 4 MG disintegrating tablet Take 1 tablet (4 mg total) by mouth every 8 (eight) hours as needed for nausea or vomiting. 09/06/20   Shaune Pollack, MD  phenylephrine-shark liver oil-mineral oil-petrolatum (PREPARATION H) 0.25-14-74.9 % rectal ointment Place 1 Application rectally every 6 (six) hours as needed for hemorrhoids.    [provider]  polyethylene glycol (MIRALAX / GLYCOLAX) 17 g packet Take 17 g by mouth daily.    [provider]  senna (SENOKOT) 8.6 MG tablet Take 1 tablet by mouth daily.    [provider]  TIADYLT ER 300 MG 24 hr capsule Take 300 mg by mouth daily. 02/06/21   [provider]    Physical Exam: Vitals:   08/26/21 2019 08/26/21 2020 08/26/21 2100 08/26/21 2200  BP:  (!) 160/59 127/66 (!) 145/61  Pulse:  71 71 68  Resp:  18 20 14   Temp:  98.1 F (36.7 C)    TempSrc:  Oral    SpO2:  99% 99% 97%  Weight: 44 kg       Vitals:   08/26/21 2019 08/26/21 2020 08/26/21 2100 08/26/21 2200  BP:  (!) 160/59 127/66 (!) 145/61  Pulse:  71 71 68  Resp:  18 20 14   Temp:  98.1 F (36.7 C)    TempSrc:  Oral    SpO2:  99% 99% 97%  Weight: 44 kg      Constitutional: NAD, calm, comfortable Eyes: PERRL, lids and conjunctivae normal ENMT: Mucous membranes are moist. Posterior pharynx clear of any exudate or lesions.Normal dentition.  Neck: normal, supple, no masses, no thyromegaly Respiratory: clear to auscultation bilaterally, no wheezing, no crackles. Normal respiratory effort. No accessory muscle use.  Cardiovascular: Regular rate and rhythm, no murmurs / rubs / gallops. No extremity edema. + pedal pulses. No carotid bruits.  Abdomen: no tenderness, no masses palpated. No hepatosplenomegaly. Bowel sounds positive.  Musculoskeletal: no clubbing / cyanosis. Right lower extremity shorter than left and externally  rotated ,no contractures. Normal muscle tone.  Skin: no rashes, lesions, ulcers. No induration Neurologic: CN 2-12 grossly intact. Sensation intact, Strength 5/5 in all upper ext, not able to fully assess lower ext due to pain  Psychiatric: Normal judgment and insight. Alert and oriented x 3. Normal mood.    Labs on Admission: I have personally reviewed following labs and imaging studies  CBC: Recent Labs  Lab 08/26/21 2022  WBC 19.0*  NEUTROABS 14.5*  HGB 12.2  HCT 37.4  MCV 96.1  PLT 260   Basic Metabolic Panel: Recent Labs  Lab 08/26/21 2022  NA 137  K 3.8  CL 103  CO2 25  GLUCOSE 181*  BUN 27*  CREATININE 1.14*  CALCIUM 8.7*   GFR: Estimated Creatinine Clearance: 19.6 mL/min (A) (by C-G formula based on SCr of 1.14 mg/dL (H)). Liver Function Tests: No results for input(s): "AST", "ALT", "ALKPHOS", "BILITOT", "PROT", "ALBUMIN" in the last 168 hours. No results for input(s): "LIPASE", "AMYLASE" in the last 168 hours. No results for input(s): "AMMONIA" in the last 168 hours. Coagulation Profile: No results for input(s): "INR", "PROTIME" in the last 168 hours. Cardiac Enzymes: No results for input(s): "CKTOTAL", "CKMB", "CKMBINDEX", "TROPONINI" in the last 168 hours. BNP (last 3 results) No results for input(s): "PROBNP" in the last 8760 hours. HbA1C: No results for input(s): "HGBA1C" in the last 72 hours. CBG: No results for input(s): "GLUCAP" in the last 168 hours. Lipid Profile: No results for input(s): "CHOL", "HDL", "LDLCALC", "TRIG", "CHOLHDL", "LDLDIRECT" in the last 72 hours. Thyroid Function Tests: No results for input(s): "TSH", "T4TOTAL", "FREET4", "T3FREE", "THYROIDAB" in the last 72 hours. Anemia Panel: No results for input(s): "VITAMINB12", "FOLATE", "FERRITIN", "TIBC", "IRON", "RETICCTPCT" in the last 72 hours. Urine analysis:    Component Value Date/Time   COLORURINE YELLOW 09/06/2020 1536   APPEARANCEUR CLEAR 09/06/2020 1536   LABSPEC 1.020  09/06/2020 1536   PHURINE 7.0 09/06/2020 1536   GLUCOSEU NEGATIVE 09/06/2020 1536   HGBUR TRACE (A) 09/06/2020 1536   BILIRUBINUR NEGATIVE 09/06/2020 1536   KETONESUR NEGATIVE 09/06/2020 1536   PROTEINUR NEGATIVE 09/06/2020 1536   NITRITE NEGATIVE 09/06/2020 1536   LEUKOCYTESUR MODERATE (A) 09/06/2020 1536    Radiological Exams on Admission: DG Chest Portable 1 View  Result Date: 08/26/2021 CLINICAL DATA:  Fall with hip fracture EXAM: PORTABLE CHEST 1 VIEW COMPARISON:  09/06/2020 FINDINGS: Mild cardiomegaly. No acute airspace disease. Apical scarring. Aortic atherosclerosis. IMPRESSION: No active disease.  Mild cardiomegaly Electronically Signed   By: Jasmine Pang M.D.   On: 08/26/2021 20:22   DG Hip Unilat W or Wo Pelvis 2-3 Views Right  Result Date: 08/26/2021 CLINICAL DATA:  Fall EXAM: DG HIP (WITH OR WITHOUT PELVIS) 2-3V RIGHT COMPARISON:  06/17/2020 FINDINGS: SI joints are non widened. Chronic left pubic rami fractures. Intramedullary rod in the left femur. Acute comminuted fracture involves the lower trochanteric femur as well as the subtrochanteric shaft. Displaced lesser trochanteric fracture fragment. Moderate apex lateral and anterior angulation. No femoral head dislocation IMPRESSION: 1. Acute comminuted displaced and angulated proximal femoral fracture involves the lower trochanteric region and subtrochanteric femur 2. Old left pubic rami fractures Electronically Signed   By: Jasmine Pang M.D.   On: 08/26/2021 20:20    EKG: Independently reviewed.   Assessment/Plan  Acute comminuted displaced and angulated proximal femoral fracture I -involves the lower trochanteric region and subtrochanteric Femur -consulted Dr Okey Dupre, with plan for OR in am  - patient is mod risk for low risk procedure based on age /hx of dCHF Will order BNP to be complete , however currently patient is cleared for surgery. -admit to med tele  - place on hip protocol   Leukocytosis -stress  response, heme concentration - repeat labs pending  -cxr NAD - UA pending to be complete -procal wnl limits -monitor fever curve  -no need for abx currently  -however if patient exhibit signs of infection/sirs would have low threshold of initiation.   Hx of Afib -currently sinus  - not on anticoag -will continue with metoprolol, diltiazem   CHFpef -no noted exacerbation    Impaired  fasting glucose  -last a1c 5.6  -  monitor fs   HTN -currently stable -resume home regimen   GERD -ppi   Hx of recurrent falls -progressive age related debility  - PT/OT  DVT prophylaxis: per ortho  Code Status: DNR Family Communication:  Sharmon Revere (Daughter)  [phone] (Work Phone Disposition Plan: patient  expected to be admitted greater than 2 midnights  Consults called: n/a Admission status: med Delton See   Lurline Del MD   If 7PM-7AM, please contact night-coverage [email or web address] Password [facility] : [facility]  08/26/2021, 10:17 PM

## 2021-08-26 NOTE — ED Triage Notes (Signed)
EMS brings pt in from [facility], fell from standing position; obvious shortening and deforming noted to rt leg/hip; pt accomp by son and transferred to stretcher

## 2021-08-26 NOTE — ED Provider Notes (Signed)
[facility] Provider Note    Event Date/Time   First MD Initiated Contact with Patient 08/26/21 2002     (approximate)   History   Fall   HPI  Vanessa Brock is a 88 y.o. female  who presents to the emergency department today because of concern for right hip pain after a fall. The patient states that she was turning around when she lost her balance. Larey Seat and hurt her right knee. The patient denies hitting her head. States she is not on any blood thinners.    Physical Exam   Triage Vital Signs: ED Triage Vitals  Enc Vitals Group     BP 08/26/21 2020 (!) 160/59     Pulse Rate 08/26/21 2020 71     Resp 08/26/21 2020 18     Temp 08/26/21 2020 98.1 F (36.7 C)     Temp Source 08/26/21 2020 Oral     SpO2 08/26/21 1948 95 %     Weight 08/26/21 2019 97 lb (44 kg)     Height --      Head Circumference --      Peak Flow --      Pain Score 08/26/21 2019 1     Pain Loc --      Pain Edu? --      Excl. in GC? --     Most recent vital signs: Vitals:   08/26/21 1948 08/26/21 2020  BP:  (!) 160/59  Pulse:  71  Resp:  18  Temp:  98.1 F (36.7 C)  SpO2: 95% 99%   General: Awake, alert, oriented. CV:  Good peripheral perfusion. Regular rate and rhythm. Resp:  Normal effort. Lungs clear. Abd:  No distention. Non tender. Other:  Right leg shortened. Hip tender to palpation and manipulation.   ED Results / Procedures / Treatments   Labs (all labs ordered are listed, but only abnormal results are displayed) Labs Reviewed  CBC WITH DIFFERENTIAL/PLATELET - Abnormal; Notable for the following components:      Result Value   WBC 19.0 (*)    Neutro Abs 14.5 (*)    Monocytes Absolute 1.2 (*)    Abs Immature Granulocytes 0.15 (*)    All other components within normal limits  BASIC METABOLIC PANEL - Abnormal; Notable for the following components:   Glucose, Bld 181 (*)    BUN 27 (*)    Creatinine, Ser 1.14 (*)    Calcium 8.7 (*)    GFR, Estimated 44 (*)     All other components within normal limits     EKG  I, Phineas Semen, attending physician, personally viewed and interpreted this EKG  EKG Time: 2138 Rate: 69 Rhythm: sinus rhythm Axis: normal Intervals: qtc 487 QRS: RBBB ST changes: no st elevation Impression: abnormal ekg   RADIOLOGY I independently interpreted and visualized the CXR. My interpretation: No pneumonia. No pneumothorax.  Radiology interpretation:  IMPRESSION:  No active disease.  Mild cardiomegaly    I independently interpreted and visualized the right hip x-ray. My interpretation: Acute fracture Radiology interpretation:  IMPRESSION:  1. Acute comminuted displaced and angulated proximal femoral  fracture involves the lower trochanteric region and subtrochanteric  femur  2. Old left pubic rami fractures     PROCEDURES:  Critical Care performed: No  Procedures   MEDICATIONS ORDERED IN ED: Medications  0.9 %  sodium chloride infusion (has no administration in time range)     IMPRESSION / MDM / ASSESSMENT  AND PLAN / ED COURSE  I reviewed the triage vital signs and the nursing notes.                              Differential diagnosis includes, but is not limited to, hip fracture, hip dislocation  Patient's presentation is most consistent with acute presentation with potential threat to life or bodily function.  Patient presented to the emergency department today because of concerns for right hip pain after a fall.  On exam patient does have shortening to that right leg.  Hip is tender to manipulation and palpation.  X-rays consistent with right hip fracture. Discussed findings with patient.  Discussed with Dr. Okey Dupre with orthopedic surgery.  Discussed with Dr. Maisie Fus with the hospitalist service who will plan on admission.   FINAL CLINICAL IMPRESSION(S) / ED DIAGNOSES   Final diagnoses:  Closed fracture of right hip, initial encounter [facility])     Note:  This document was prepared  using Dragon voice recognition software and may include unintentional dictation errors.    Phineas Semen, MD 08/26/21 [phone]

## 2021-08-27 ENCOUNTER — Other Ambulatory Visit: Payer: Self-pay

## 2021-08-27 ENCOUNTER — Inpatient Hospital Stay: Payer: Medicare Other | Admitting: Certified Registered Nurse Anesthetist

## 2021-08-27 ENCOUNTER — Encounter
Admission: EM | Disposition: A | Discharge status: Skilled Nursing Facility | Payer: Self-pay | Source: Home / Self Care | Attending: Internal Medicine

## 2021-08-27 ENCOUNTER — Encounter: Payer: Self-pay | Admitting: Internal Medicine

## 2021-08-27 ENCOUNTER — Inpatient Hospital Stay: Payer: Medicare Other

## 2021-08-27 DIAGNOSIS — S72002D Fracture of unspecified part of neck of left femur, subsequent encounter for closed fracture with routine healing: Secondary | ICD-10-CM

## 2021-08-27 DIAGNOSIS — E440 Moderate protein-calorie malnutrition: Secondary | ICD-10-CM | POA: Insufficient documentation

## 2021-08-27 HISTORY — PX: FEMUR IM NAIL: SHX1597

## 2021-08-27 LAB — URINALYSIS, ROUTINE W REFLEX MICROSCOPIC
Bilirubin Urine: NEGATIVE
Glucose, UA: NEGATIVE mg/dL
Hgb urine dipstick: NEGATIVE
Ketones, ur: 5 mg/dL — AB
Leukocytes,Ua: NEGATIVE
Nitrite: NEGATIVE
Protein, ur: 30 mg/dL — AB
Specific Gravity, Urine: 1.02 (ref 1.005–1.030)
Squamous Epithelial / HPF: NONE SEEN (ref 0–5)
pH: 5 (ref 5.0–8.0)

## 2021-08-27 LAB — CBC WITH DIFFERENTIAL/PLATELET
Abs Immature Granulocytes: 0.08 10*3/uL — ABNORMAL HIGH (ref 0.00–0.07)
Basophils Absolute: 0.1 10*3/uL (ref 0.0–0.1)
Basophils Relative: 0 %
Eosinophils Absolute: 0 10*3/uL (ref 0.0–0.5)
Eosinophils Relative: 0 %
HCT: 34.7 % — ABNORMAL LOW (ref 36.0–46.0)
Hemoglobin: 11.4 g/dL — ABNORMAL LOW (ref 12.0–15.0)
Immature Granulocytes: 1 %
Lymphocytes Relative: 8 %
Lymphs Abs: 1.3 10*3/uL (ref 0.7–4.0)
MCH: 31.3 pg (ref 26.0–34.0)
MCHC: 32.9 g/dL (ref 30.0–36.0)
MCV: 95.3 fL (ref 80.0–100.0)
Monocytes Absolute: 0.9 10*3/uL (ref 0.1–1.0)
Monocytes Relative: 6 %
Neutro Abs: 14.7 10*3/uL — ABNORMAL HIGH (ref 1.7–7.7)
Neutrophils Relative %: 85 %
Platelets: 239 10*3/uL (ref 150–400)
RBC: 3.64 MIL/uL — ABNORMAL LOW (ref 3.87–5.11)
RDW: 12.7 % (ref 11.5–15.5)
WBC: 17.1 10*3/uL — ABNORMAL HIGH (ref 4.0–10.5)
nRBC: 0 % (ref 0.0–0.2)

## 2021-08-27 LAB — BASIC METABOLIC PANEL
Anion gap: 5 (ref 5–15)
BUN: 27 mg/dL — ABNORMAL HIGH (ref 8–23)
CO2: 26 mmol/L (ref 22–32)
Calcium: 8.5 mg/dL — ABNORMAL LOW (ref 8.9–10.3)
Chloride: 104 mmol/L (ref 98–111)
Creatinine, Ser: 1 mg/dL (ref 0.44–1.00)
GFR, Estimated: 51 mL/min — ABNORMAL LOW (ref >60)
Glucose, Bld: 192 mg/dL — ABNORMAL HIGH (ref 70–99)
Potassium: 4.4 mmol/L (ref 3.5–5.1)
Sodium: 135 mmol/L (ref 135–145)

## 2021-08-27 LAB — ALBUMIN: Albumin: 3.7 g/dL (ref 3.5–5.0)

## 2021-08-27 LAB — CBC
HCT: 31.2 % — ABNORMAL LOW (ref 36.0–46.0)
Hemoglobin: 10.4 g/dL — ABNORMAL LOW (ref 12.0–15.0)
MCH: 31.7 pg (ref 26.0–34.0)
MCHC: 33.3 g/dL (ref 30.0–36.0)
MCV: 95.1 fL (ref 80.0–100.0)
Platelets: 228 10*3/uL (ref 150–400)
RBC: 3.28 MIL/uL — ABNORMAL LOW (ref 3.87–5.11)
RDW: 12.7 % (ref 11.5–15.5)
WBC: 14.6 10*3/uL — ABNORMAL HIGH (ref 4.0–10.5)
nRBC: 0 % (ref 0.0–0.2)

## 2021-08-27 LAB — C-REACTIVE PROTEIN: CRP: 0.5 mg/dL (ref <1.0)

## 2021-08-27 LAB — PROCALCITONIN: Procalcitonin: <0.1 ng/mL

## 2021-08-27 LAB — LACTIC ACID, PLASMA
Lactic Acid, Venous: 1 mmol/L (ref 0.5–1.9)
Lactic Acid, Venous: 1.6 mmol/L (ref 0.5–1.9)

## 2021-08-27 LAB — VITAMIN D 25 HYDROXY (VIT D DEFICIENCY, FRACTURES): Vit D, 25-Hydroxy: 57.47 ng/mL (ref 30–100)

## 2021-08-27 SURGERY — INSERTION, INTRAMEDULLARY ROD, FEMUR
Anesthesia: Spinal | Site: Hip | Laterality: Right

## 2021-08-27 MED ORDER — KETOROLAC TROMETHAMINE 30 MG/ML IJ SOLN
INTRAMUSCULAR | Status: AC
  Filled 2021-08-27: qty 1

## 2021-08-27 MED ORDER — ACETAMINOPHEN 500 MG PO TABS
500.0000 mg | ORAL_TABLET | Freq: Four times a day (QID) | ORAL | Status: AC
  Administered 2021-08-27: 500 mg via ORAL
  Filled 2021-08-27 (×2): qty 1

## 2021-08-27 MED ORDER — ONDANSETRON HCL 4 MG PO TABS: 4.0000 mg | ORAL_TABLET | Freq: Four times a day (QID) | ORAL | Status: DC | PRN

## 2021-08-27 MED ORDER — ONDANSETRON HCL 4 MG/2ML IJ SOLN: 4.0000 mg | Freq: Four times a day (QID) | INTRAMUSCULAR | Status: DC | PRN

## 2021-08-27 MED ORDER — ACETAMINOPHEN 325 MG PO TABS: 325.0000 mg | ORAL_TABLET | Freq: Four times a day (QID) | ORAL | Status: DC | PRN

## 2021-08-27 MED ORDER — LIDOCAINE HCL (PF) 1 % IJ SOLN
INTRAMUSCULAR | Status: AC
  Filled 2021-08-27: qty 60

## 2021-08-27 MED ORDER — METOPROLOL TARTRATE 25 MG PO TABS
12.5000 mg | ORAL_TABLET | Freq: Two times a day (BID) | ORAL | Status: DC
  Administered 2021-08-27 – 2021-08-31 (×9): 12.5 mg via ORAL
  Filled 2021-08-27 (×9): qty 1

## 2021-08-27 MED ORDER — PROPOFOL 500 MG/50ML IV EMUL
INTRAVENOUS | Status: DC | PRN
  Administered 2021-08-27: 25 ug/kg/min via INTRAVENOUS

## 2021-08-27 MED ORDER — OXYCODONE HCL 5 MG PO TABS
5.0000 mg | ORAL_TABLET | ORAL | Status: DC | PRN
  Administered 2021-08-27: 5 mg via ORAL
  Filled 2021-08-27: qty 1

## 2021-08-27 MED ORDER — OXYCODONE HCL 5 MG/5ML PO SOLN: 5.0000 mg | Freq: Once | ORAL | Status: DC | PRN

## 2021-08-27 MED ORDER — CEFAZOLIN SODIUM-DEXTROSE 1-4 GM/50ML-% IV SOLN
1.0000 g | Freq: Three times a day (TID) | INTRAVENOUS | Status: AC
  Administered 2021-08-27: 1 g via INTRAVENOUS
  Filled 2021-08-27 (×2): qty 50

## 2021-08-27 MED ORDER — FENTANYL CITRATE (PF) 100 MCG/2ML IJ SOLN: 25.0000 ug | INTRAMUSCULAR | Status: DC | PRN

## 2021-08-27 MED ORDER — CEFAZOLIN SODIUM-DEXTROSE 1-4 GM/50ML-% IV SOLN
1.0000 g | INTRAVENOUS | Status: AC
  Administered 2021-08-27: 1 g via INTRAVENOUS

## 2021-08-27 MED ORDER — 0.9 % SODIUM CHLORIDE (POUR BTL) OPTIME
TOPICAL | Status: DC | PRN
  Administered 2021-08-27: 500 mL

## 2021-08-27 MED ORDER — CEFAZOLIN SODIUM-DEXTROSE 1-4 GM/50ML-% IV SOLN
INTRAVENOUS | Status: AC
  Filled 2021-08-27: qty 50

## 2021-08-27 MED ORDER — BUPIVACAINE-EPINEPHRINE (PF) 0.5% -1:200000 IJ SOLN
INTRAMUSCULAR | Status: AC
  Filled 2021-08-27: qty 60

## 2021-08-27 MED ORDER — LIDOCAINE HCL (PF) 1 % IJ SOLN
INTRAMUSCULAR | Status: DC | PRN
  Administered 2021-08-27: 30 mL via INTRAMUSCULAR

## 2021-08-27 MED ORDER — EPHEDRINE SULFATE (PRESSORS) 50 MG/ML IJ SOLN
INTRAMUSCULAR | Status: DC | PRN
  Administered 2021-08-27: 5 mg via INTRAVENOUS
  Administered 2021-08-27 (×2): 10 mg via INTRAVENOUS

## 2021-08-27 MED ORDER — MIRTAZAPINE 15 MG PO TABS
7.5000 mg | ORAL_TABLET | Freq: Every day | ORAL | Status: DC
  Administered 2021-08-27 – 2021-08-30 (×4): 7.5 mg via ORAL
  Filled 2021-08-27 (×4): qty 1

## 2021-08-27 MED ORDER — HYDROCODONE-ACETAMINOPHEN 5-325 MG PO TABS
1.0000 | ORAL_TABLET | ORAL | Status: DC | PRN
  Administered 2021-08-28 (×2): 1 via ORAL
  Administered 2021-08-29 (×2): 2 via ORAL
  Administered 2021-08-30: 1 via ORAL
  Administered 2021-08-31: 2 via ORAL
  Filled 2021-08-27: qty 1
  Filled 2021-08-27: qty 2
  Filled 2021-08-27 (×2): qty 1
  Filled 2021-08-27: qty 2

## 2021-08-27 MED ORDER — METOCLOPRAMIDE HCL 5 MG/ML IJ SOLN: 5.0000 mg | Freq: Three times a day (TID) | INTRAMUSCULAR | Status: DC | PRN

## 2021-08-27 MED ORDER — SENNA 8.6 MG PO TABS
8.6000 mg | ORAL_TABLET | Freq: Every day | ORAL | Status: DC
  Administered 2021-08-27 – 2021-08-30 (×4): 8.6 mg via ORAL
  Filled 2021-08-27 (×5): qty 1

## 2021-08-27 MED ORDER — FENTANYL CITRATE (PF) 100 MCG/2ML IJ SOLN
INTRAMUSCULAR | Status: DC | PRN
  Administered 2021-08-27 (×4): 25 ug via INTRAVENOUS

## 2021-08-27 MED ORDER — PHENYLEPHRINE HCL (PRESSORS) 10 MG/ML IV SOLN
INTRAVENOUS | Status: DC | PRN
  Administered 2021-08-27: 50 ug via INTRAVENOUS
  Administered 2021-08-27: 100 ug via INTRAVENOUS
  Administered 2021-08-27: 50 ug via INTRAVENOUS
  Administered 2021-08-27 (×5): 100 ug via INTRAVENOUS

## 2021-08-27 MED ORDER — SODIUM CHLORIDE 0.9 % IV SOLN: INTRAVENOUS | Status: DC

## 2021-08-27 MED ORDER — ACETAMINOPHEN 325 MG PO TABS
650.0000 mg | ORAL_TABLET | Freq: Four times a day (QID) | ORAL | Status: DC | PRN
Start: 2021-08-27 — End: 2021-08-27

## 2021-08-27 MED ORDER — BUPIVACAINE HCL (PF) 0.5 % IJ SOLN
INTRAMUSCULAR | Status: DC | PRN
  Administered 2021-08-27: 2.2 mL

## 2021-08-27 MED ORDER — METOCLOPRAMIDE HCL 5 MG PO TABS: 5.0000 mg | ORAL_TABLET | Freq: Three times a day (TID) | ORAL | Status: DC | PRN

## 2021-08-27 MED ORDER — PHENOL 1.4 % MT LIQD: 1.0000 | OROMUCOSAL | Status: DC | PRN

## 2021-08-27 MED ORDER — ENOXAPARIN SODIUM 40 MG/0.4ML IJ SOSY
40.0000 mg | PREFILLED_SYRINGE | INTRAMUSCULAR | Status: DC
  Filled 2021-08-27: qty 0.4

## 2021-08-27 MED ORDER — ONDANSETRON HCL 4 MG/2ML IJ SOLN
4.0000 mg | Freq: Four times a day (QID) | INTRAMUSCULAR | Status: DC | PRN
  Administered 2021-08-27: 4 mg via INTRAVENOUS
  Filled 2021-08-27: qty 2

## 2021-08-27 MED ORDER — SODIUM CHLORIDE 0.9 % IV SOLN: INTRAVENOUS | Status: DC | PRN

## 2021-08-27 MED ORDER — PROPOFOL 10 MG/ML IV BOLUS
INTRAVENOUS | Status: DC | PRN
  Administered 2021-08-27 (×3): 20 mg via INTRAVENOUS

## 2021-08-27 MED ORDER — HYDROCODONE-ACETAMINOPHEN 5-325 MG PO TABS: 1.0000 | ORAL_TABLET | ORAL | 0 refills | Status: AC | PRN

## 2021-08-27 MED ORDER — OXYCODONE HCL 5 MG PO TABS: 5.0000 mg | ORAL_TABLET | Freq: Once | ORAL | Status: DC | PRN

## 2021-08-27 MED ORDER — MORPHINE SULFATE (PF) 2 MG/ML IV SOLN: 0.5000 mg | INTRAVENOUS | Status: DC | PRN

## 2021-08-27 MED ORDER — HYDRALAZINE HCL 10 MG PO TABS
10.0000 mg | ORAL_TABLET | Freq: Three times a day (TID) | ORAL | Status: DC
  Administered 2021-08-27 – 2021-08-31 (×10): 10 mg via ORAL
  Filled 2021-08-27 (×14): qty 1

## 2021-08-27 MED ORDER — MENTHOL 3 MG MT LOZG: 1.0000 | LOZENGE | OROMUCOSAL | Status: DC | PRN

## 2021-08-27 MED ORDER — HYDROCODONE-ACETAMINOPHEN 7.5-325 MG PO TABS
1.0000 | ORAL_TABLET | ORAL | Status: DC | PRN
  Administered 2021-08-28 – 2021-08-31 (×4): 1 via ORAL
  Filled 2021-08-27 (×4): qty 1

## 2021-08-27 MED ORDER — PANTOPRAZOLE SODIUM 40 MG PO TBEC
40.0000 mg | DELAYED_RELEASE_TABLET | Freq: Every day | ORAL | Status: DC
  Administered 2021-08-27 – 2021-08-31 (×5): 40 mg via ORAL
  Filled 2021-08-27 (×5): qty 1

## 2021-08-27 MED ORDER — MORPHINE SULFATE (PF) 2 MG/ML IV SOLN: 2.0000 mg | INTRAVENOUS | Status: DC | PRN

## 2021-08-27 MED ORDER — DOCUSATE SODIUM 100 MG PO CAPS: 100.0000 mg | ORAL_CAPSULE | Freq: Two times a day (BID) | ORAL | Status: DC

## 2021-08-27 MED ORDER — FENTANYL CITRATE (PF) 100 MCG/2ML IJ SOLN
INTRAMUSCULAR | Status: AC
  Filled 2021-08-27: qty 2

## 2021-08-27 SURGICAL SUPPLY — 56 items
BIT DRILL INTERTAN LAG SCREW (BIT) IMPLANT
BIT DRILL SHORT 4.0 (BIT) IMPLANT
BNDG COHESIVE 6X5 TAN ST LF (GAUZE/BANDAGES/DRESSINGS) ×3 IMPLANT
CHLORAPREP W/TINT 26 (MISCELLANEOUS) ×1 IMPLANT
DRAPE 3/4 80X56 (DRAPES) ×2 IMPLANT
DRAPE C-ARM 42X72 X-RAY (DRAPES) ×1 IMPLANT
DRAPE SURG 17X11 SM STRL (DRAPES) ×2 IMPLANT
DRAPE U-SHAPE 47X51 STRL (DRAPES) ×1 IMPLANT
DRILL BIT SHORT 4.0 (BIT) ×2
DRSG OPSITE POSTOP 3X4 (GAUZE/BANDAGES/DRESSINGS) ×2 IMPLANT
DRSG OPSITE POSTOP 4X14 (GAUZE/BANDAGES/DRESSINGS) IMPLANT
DRSG OPSITE POSTOP 4X6 (GAUZE/BANDAGES/DRESSINGS) ×1 IMPLANT
ELECT REM PT RETURN 9FT ADLT (ELECTROSURGICAL) ×1
ELECTRODE REM PT RTRN 9FT ADLT (ELECTROSURGICAL) ×1 IMPLANT
GAUZE SPONGE 4X4 12PLY STRL (GAUZE/BANDAGES/DRESSINGS) ×1 IMPLANT
GAUZE XEROFORM 1X8 LF (GAUZE/BANDAGES/DRESSINGS) ×1 IMPLANT
GLOVE BIO SURGEON STRL SZ7.5 (GLOVE) ×1 IMPLANT
GLOVE SURG UNDER POLY LF SZ7.5 (GLOVE) ×1 IMPLANT
GOWN STRL REUS W/ TWL XL LVL3 (GOWN DISPOSABLE) ×1 IMPLANT
GOWN STRL REUS W/TWL XL LVL3 (GOWN DISPOSABLE) ×1
GOWN STRL REUS W/TWL XL LVL4 (GOWN DISPOSABLE) ×1 IMPLANT
GUIDE PIN 3.2X343 (PIN) ×2
GUIDE PIN 3.2X343MM (PIN) ×2
GUIDE ROD 3.0 (MISCELLANEOUS) ×1
HEMOVAC 400CC 10FR (MISCELLANEOUS) IMPLANT
HOLDER FOLEY CATH W/STRAP (MISCELLANEOUS) ×1 IMPLANT
KIT TURNOVER CYSTO (KITS) ×1 IMPLANT
MANIFOLD NEPTUNE II (INSTRUMENTS) ×1 IMPLANT
MAT ABSORB  FLUID 56X50 GRAY (MISCELLANEOUS) ×1
MAT ABSORB FLUID 56X50 GRAY (MISCELLANEOUS) ×1 IMPLANT
NAIL RIGHT 10X38MM (Nail) IMPLANT
NEEDLE HYPO 22GX1.5 SAFETY (NEEDLE) IMPLANT
NS IRRIG 1000ML POUR BTL (IV SOLUTION) ×1 IMPLANT
NS IRRIG 500ML POUR BTL (IV SOLUTION) IMPLANT
PACK HIP COMPR (MISCELLANEOUS) ×1 IMPLANT
PAD ABD DERMACEA PRESS 5X9 (GAUZE/BANDAGES/DRESSINGS) ×1 IMPLANT
PAD ARMBOARD 7.5X6 YLW CONV (MISCELLANEOUS) ×1 IMPLANT
PIN GUIDE 3.2X343MM (PIN) IMPLANT
ROD GUIDE 3.0 (MISCELLANEOUS) IMPLANT
SCREW LAG COMPR KIT 90/85 (Screw) IMPLANT
SCREW TRIGEN LOW PROF 5.0X37.5 (Screw) IMPLANT
SCREW TRIGEN LOW PROF 5.0X40 (Screw) IMPLANT
STAPLER SKIN PROX 35W (STAPLE) ×1 IMPLANT
SUCTION FRAZIER HANDLE 10FR (MISCELLANEOUS) ×1
SUCTION TUBE FRAZIER 10FR DISP (MISCELLANEOUS) ×1 IMPLANT
SUT VIC AB 0 CT1 36 (SUTURE) ×2 IMPLANT
SUT VIC AB 2-0 CT1 (SUTURE) ×1 IMPLANT
SUT VIC AB 2-0 CT1 27 (SUTURE) ×1
SUT VIC AB 2-0 CT1 TAPERPNT 27 (SUTURE) ×1 IMPLANT
SUT VICRYL 0 AB UR-6 (SUTURE) ×1 IMPLANT
SYR 10ML LL (SYRINGE) IMPLANT
SYR 30ML LL (SYRINGE) ×1 IMPLANT
TAPE PAPER 1/2X10 TAN MEDIPORE (MISCELLANEOUS) ×1 IMPLANT
TRAP FLUID SMOKE EVACUATOR (MISCELLANEOUS) ×1 IMPLANT
TRAY FOLEY SLVR 16FR LF STAT (SET/KITS/TRAYS/PACK) ×1 IMPLANT
WATER STERILE IRR 500ML POUR (IV SOLUTION) ×1 IMPLANT

## 2021-08-27 NOTE — Progress Notes (Signed)
OT Cancellation Note  Patient Details Name: SANDIE SWAYZE MRN: [id number] DOB: Sep 08, 1922   Cancelled Treatment:    Reason Eval/Treat Not Completed: Patient not medically ready. Received orders, reviewed chart. Pt still holding for orthopedic surgery for L hip fracture later today. Will conduct OT evaluation after surgery is completed and pt is medically appropriate for participation in rehab services.  Latina Craver 08/27/2021, 8:19 AM

## 2021-08-27 NOTE — Discharge Instructions (Signed)
Orthopedic discharge instructions: Patient has had a right femur intramedullary nail Patient should remain weightbearing on the operative extremity until follow-up Dressing changes as needed DVT prophylaxis includes lovenox 40mg  x 14 days Follow up with Dr. PA, Murilo Zanette PA-C at [facility], [place] office in 10-14 days for wound check, staple removal and x-ray.

## 2021-08-27 NOTE — Progress Notes (Signed)
During assessment dressing was found saturated with blood.  Honeycomb dressing was removed so incision site could be assessed. A small amount of blood was oozing from the staple at the end of the incision. . Pressure was held, bleeding stopped. Xeroform and honeycomb dressing was replaced.  I will continue to assess site for bleeding.

## 2021-08-27 NOTE — Transfer of Care (Signed)
Immediate Anesthesia Transfer of Care Note  Patient: Vanessa Brock  Procedure(s) Performed: INTRAMEDULLARY (IM) NAIL FEMORAL (Right: Hip)  Patient Location: PACU  Anesthesia Type:Spinal  Level of Consciousness: awake  Airway & Oxygen Therapy: Patient Spontanous Breathing  Post-op Assessment: Report given to RN and Post -op Vital signs reviewed and stable  Post vital signs: Reviewed  Last Vitals:  Vitals Value Taken Time  BP    Temp    Pulse    Resp    SpO2      Last Pain:         Complications: No notable events documented.

## 2021-08-27 NOTE — Progress Notes (Addendum)
PROGRESS NOTE    Vanessa Brock  FGH:[id number] DOB: 02/07/1922 DOA: 08/26/2021 PCP: [facility]    Brief Narrative:  88 y.o. female with medical history significant of  HTN, GERD, impaired fasting glucose, Afib not on anticoagulation, CHFpef, depression acute comminuted displaced and angulated proximal femoral fracture involves the lower trochanteric region and subtrochanteric Femur s/p repair which was complicated by new onset afib. Patient also has hx of admission on for gastro enteritis due to norovirus. Patient now  presents to ED s/p mechanical fall with associated complaint of injury to left leg with pain and noted deformity of leg. Patient on ros denies abdominal pain/ dysuria/ fever chills,sob/ chest pain n/v does note mild constipation. Notes pain in right leg.  Seen in consultation by orthopedic surgery.  Plan for operative fixation on 8/24   Assessment & Plan:   Principal Problem:   Closed left hip fracture (HCC) Active Problems:   Chronic diastolic CHF (congestive heart failure) (HCC)   Paroxysmal atrial fibrillation (HCC)   Fall   Leukocytosis  Right hip fracture Status post mechanical fall No evidence of syncopal event Pain mild to moderate Plan: N.p.o. OR today with Dr. Okey Dupre orthopedics Multimodal pain control Hold chemoprophylaxis until postoperative day #1 PT OT to start POD #1  Leukocytosis Suspect stress response No evidence of infection No indication for antibiotics Monitor vitals and fever curve  History of atrial fibrillation Suspect paroxysmal Current sinus rhythm No anticoagulation, presumably due to fall risk and advanced age Continue telemetry for now Continue home metoprolol  History of congestive heart failure with preserved ejection fraction No evidence of exacerbation  Essential hypertension Blood pressure controlled Resume home regimen  GERD PPI  History of recurrent falls Therapy to initiate postoperative  day #1  DVT prophylaxis: SCD Code Status: DNR, confirmed with patient and son at bedside Family Communication: Son at bedside 8/24 Disposition Plan: Status is: Inpatient Remains inpatient appropriate because: Hip fracture.  OR today   Level of care: Telemetry Medical  Consultants:  Orthopedics  Procedures:  Hip fracture repair planned 8/24  Antimicrobials: None   Subjective: Seen and examined.  Resting comfortably in bed.  No visible distress.  Son at bedside  Objective: Vitals:   08/27/21 0005 08/27/21 0131 08/27/21 0420 08/27/21 0630  BP:  (!) 137/51 125/62 (!) 160/65  Pulse:  70 80 (!) 109  Resp:  17 15 17   Temp:  98.1 F (36.7 C) 97.6 F (36.4 C) 98.1 F (36.7 C)  TempSrc:  Oral    SpO2:  97% 97% 97%  Weight: 46.9 kg      No intake or output data in the 24 hours ending 08/27/21 1200 Filed Weights   08/26/21 2019 08/27/21 0005  Weight: 44 kg 46.9 kg    Examination:  General exam: No acute distress Respiratory system: Clear to auscultation. Respiratory effort normal. Cardiovascular system: S1-2, RRR, no murmurs, trace pedal edema bilaterally Gastrointestinal system: Soft, NT/ND, normal bowel sounds Central nervous system: Alert, oriented x2, no focal deficits Extremities: Right leg shortened and externally rotated.  Decreased ROM Skin: No rashes, lesions or ulcers Psychiatry: Judgement and insight appear normal. Mood & affect appropriate.     Data Reviewed: I have personally reviewed following labs and imaging studies  CBC: Recent Labs  Lab 08/26/21 2022 08/27/21 0012 08/27/21 0208  WBC 19.0* 17.1* 14.6*  NEUTROABS 14.5* 14.7*  --   HGB 12.2 11.4* 10.4*  HCT 37.4 34.7* 31.2*  MCV 96.1 95.3 95.1  PLT 260 239 228   Basic Metabolic Panel: Recent Labs  Lab 08/26/21 2022 08/27/21 0208  NA 137 135  K 3.8 4.4  CL 103 104  CO2 25 26  GLUCOSE 181* 192*  BUN 27* 27*  CREATININE 1.14* 1.00  CALCIUM 8.7* 8.5*   GFR: Estimated Creatinine  Clearance: 23.8 mL/min (by C-G formula based on SCr of 1 mg/dL). Liver Function Tests: Recent Labs  Lab 08/27/21 0012  ALBUMIN 3.7   No results for input(s): "LIPASE", "AMYLASE" in the last 168 hours. No results for input(s): "AMMONIA" in the last 168 hours. Coagulation Profile: No results for input(s): "INR", "PROTIME" in the last 168 hours. Cardiac Enzymes: No results for input(s): "CKTOTAL", "CKMB", "CKMBINDEX", "TROPONINI" in the last 168 hours. BNP (last 3 results) No results for input(s): "PROBNP" in the last 8760 hours. HbA1C: No results for input(s): "HGBA1C" in the last 72 hours. CBG: No results for input(s): "GLUCAP" in the last 168 hours. Lipid Profile: No results for input(s): "CHOL", "HDL", "LDLCALC", "TRIG", "CHOLHDL", "LDLDIRECT" in the last 72 hours. Thyroid Function Tests: No results for input(s): "TSH", "T4TOTAL", "FREET4", "T3FREE", "THYROIDAB" in the last 72 hours. Anemia Panel: No results for input(s): "VITAMINB12", "FOLATE", "FERRITIN", "TIBC", "IRON", "RETICCTPCT" in the last 72 hours. Sepsis Labs: Recent Labs  Lab 08/27/21 0012 08/27/21 0208  PROCALCITON <0.10  --   LATICACIDVEN 1.6 1.0    No results found for this or any previous visit (from the past 240 hour(s)).       Radiology Studies: DG Chest Portable 1 View  Result Date: 08/26/2021 CLINICAL DATA:  Fall with hip fracture EXAM: PORTABLE CHEST 1 VIEW COMPARISON:  09/06/2020 FINDINGS: Mild cardiomegaly. No acute airspace disease. Apical scarring. Aortic atherosclerosis. IMPRESSION: No active disease.  Mild cardiomegaly Electronically Signed   By: Jasmine Pang M.D.   On: 08/26/2021 20:22   DG Hip Unilat W or Wo Pelvis 2-3 Views Right  Result Date: 08/26/2021 CLINICAL DATA:  Fall EXAM: DG HIP (WITH OR WITHOUT PELVIS) 2-3V RIGHT COMPARISON:  06/17/2020 FINDINGS: SI joints are non widened. Chronic left pubic rami fractures. Intramedullary rod in the left femur. Acute comminuted fracture involves  the lower trochanteric femur as well as the subtrochanteric shaft. Displaced lesser trochanteric fracture fragment. Moderate apex lateral and anterior angulation. No femoral head dislocation IMPRESSION: 1. Acute comminuted displaced and angulated proximal femoral fracture involves the lower trochanteric region and subtrochanteric femur 2. Old left pubic rami fractures Electronically Signed   By: Jasmine Pang M.D.   On: 08/26/2021 20:20        Scheduled Meds:  docusate sodium  100 mg Oral BID   hydrALAZINE  10 mg Oral TID   metoprolol tartrate  12.5 mg Oral BID   mirtazapine  7.5 mg Oral QHS   pantoprazole  40 mg Oral Daily   senna  8.6 mg Oral Daily   Continuous Infusions:   ceFAZolin (ANCEF) IV       LOS: 1 day     Tresa Moore, MD [facility]   If 7PM-7AM, please contact night-coverage  08/27/2021, 12:00 PM

## 2021-08-27 NOTE — Anesthesia Preprocedure Evaluation (Signed)
Anesthesia Evaluation  Patient identified by MRN, date of birth, ID band Patient awake    Reviewed: Allergy & Precautions, NPO status , Patient's Chart, lab work & pertinent test results  History of Anesthesia Complications Negative for: history of anesthetic complications  Airway Mallampati: II  TM Distance: >3 FB Neck ROM: full    Dental  (+) Dental Advidsory Given, Poor Dentition, Chipped   Pulmonary neg pulmonary ROS, neg sleep apnea, neg COPD, Not current smoker,    Pulmonary exam normal        Cardiovascular hypertension, Pt. on medications (-) angina+CHF  (-) Past MI and (-) CABG Normal cardiovascular exam     Neuro/Psych neg Seizures PSYCHIATRIC DISORDERS negative neurological ROS     GI/Hepatic negative GI ROS, Neg liver ROS, GERD  Medicated and Controlled,  Endo/Other  negative endocrine ROSneg diabetes  Renal/GU negative Renal ROS     Musculoskeletal   Abdominal   Peds  Hematology negative hematology ROS (+)   Anesthesia Other Findings Past Medical History: No date: AKI (acute kidney injury) (HCC)     Comment:  a. 02/2018 in setting of hip fx -->improved w/ IVF. No date: Closed displaced fracture of left femoral neck (HCC)     Comment:  a. 02/2018 s/p ORIF. No date: Diastolic dysfunction     Comment:  a. 02/2018 Limited Echo: EF > 65%, impaired diast               relaxation. Nl RV fxn.  No date: GERD (gastroesophageal reflux disease) No date: Hypertension No date: Impaired fasting glucose No date: Persistent atrial fibrillation (HCC)     Comment:  a. 02/2018 Dx following ORIF L hip 2/2 fx; b. CHA2DS2VASc              = 4-->Eliquis.  Past Surgical History: No date: ABDOMINAL HYSTERECTOMY No date: CHOLECYSTECTOMY 02/06/2018: HIP SURGERY 02/06/2018: INTRAMEDULLARY (IM) NAIL INTERTROCHANTERIC; Left     Comment:  Procedure: INTRAMEDULLARY (IM) NAIL INTERTROCHANTRIC;                Surgeon: Donato Heinz, MD;  Location: [facility] ORS;                Service: Orthopedics;  Laterality: Left;  BMI    Body Mass Index: 18.32 kg/m      Reproductive/Obstetrics negative OB ROS                             Anesthesia Physical  Anesthesia Plan  ASA: 3  Anesthesia Plan: Spinal   Post-op Pain Management:    Induction:   PONV Risk Score and Plan: Ondansetron, Dexamethasone, Midazolam and Treatment may vary due to age or medical condition  Airway Management Planned: Nasal Cannula  Additional Equipment:   Intra-op Plan:   Post-operative Plan:   Informed Consent: I have reviewed the patients History and Physical, chart, labs and discussed the procedure including the risks, benefits and alternatives for the proposed anesthesia with the patient or authorized representative who has indicated his/her understanding and acceptance.     Dental Advisory Given  Plan Discussed with: Anesthesiologist, CRNA and Surgeon  Anesthesia Plan Comments: (Patient reports no bleeding problems and no anticoagulant use.  Plan for spinal with backup GA  Patient consented for risks of anesthesia including but not limited to:  - adverse reactions to medications - damage to eyes, teeth, lips or other oral mucosa - nerve damage due to positioning  -  risk of bleeding, infection and or nerve damage from spinal that could lead to paralysis - risk of headache or failed spinal - damage to teeth, lips or other oral mucosa - sore throat or hoarseness - damage to heart, brain, nerves, lungs, other parts of body or loss of life  Patient voiced understanding.)        Anesthesia Quick Evaluation

## 2021-08-27 NOTE — Plan of Care (Signed)

## 2021-08-27 NOTE — Anesthesia Procedure Notes (Signed)
Spinal  Patient location during procedure: OR Start time: 08/27/2021 1:35 PM End time: 08/27/2021 1:45 PM Reason for block: surgical anesthesia Staffing Performed: anesthesiologist  Anesthesiologist: Woodson, Thomas, MD Performed by: Woodson, Thomas, MD Authorized by: Woodson, Thomas, MD   Preanesthetic Checklist Completed: patient identified, IV checked, site marked, risks and benefits discussed, surgical consent, monitors and equipment checked, pre-op evaluation and timeout performed Spinal Block Patient position: sitting Prep: ChloraPrep and site prepped and draped Patient monitoring: heart rate, continuous pulse ox, blood pressure and cardiac monitor Approach: midline Location: L3-4 Injection technique: single-shot Needle Needle type: Whitacre and Introducer  Needle gauge: 22 G Needle length: 9 cm Assessment Sensory level: T10 Events: CSF return Additional Notes Meticulous sterile technique used throughout (CHG prep, sterile gloves, sterile drape). Negative paresthesia. Negative blood return. Positive free-flowing CSF. Expiration date of kit checked and confirmed. Patient tolerated procedure well, without complications.     

## 2021-08-27 NOTE — Op Note (Addendum)
DATE OF SURGERY:  08/27/2021  TIME: 4:05 PM  PATIENT NAME:  Vanessa Brock  AGE: 88 y.o.  PRE-OPERATIVE DIAGNOSIS:  Right Femur Fracture  POST-OPERATIVE DIAGNOSIS:  SAME  PROCEDURE:  Right INTRAMEDULLARY (IM) NAIL FEMORAL  SURGEON:  Ross Marcus  EBL:  300 cc  COMPLICATIONS:  none  OPERATIVE IMPLANTS: [employer] Intertan femoral nail 10 mm x 380 mm  PREOPERATIVE INDICATIONS:  Vanessa Brock is a 88 y.o. year old who fell and suffered a hip fracture. She was brought into the ER and then admitted and optimized and then elected for surgical intervention.    The risks benefits and alternatives were discussed with the patient including but not limited to the risks of nonoperative treatment, versus surgical intervention including infection, bleeding, nerve injury, malunion, nonunion, hardware prominence, hardware failure, need for hardware removal, blood clots, cardiopulmonary complications, morbidity, mortality, among others, and they were willing to proceed.    OPERATIVE PROCEDURE:  The patient was brought to the operating room and placed in the supine position.  Spinal anesthesia was administered, with a foley. She was placed on the fracture table.  Closed reduction was performed under C-arm guidance. The length of the femur was also measured using fluoroscopy. Time out was then performed after sterile prep and drape. She received preoperative antibiotics.  Incision was made proximal to the greater trochanter. A guidewire was placed in the appropriate position.  A lateral incision was made and dissection was carried down to the fracture site.  Lobster-claw clamp was utilized to provisionally reduce the fracture to remove the deforming forces and allow the guidewire to be passed to the knee.  Confirmation of guidewire placement and fracture reduction was made on AP and lateral views.  The femur was reamed up to a size 11.5 mm diameter with appropriate chatter.  The above-named nail was  opened. I opened the proximal femur with a reamer. I then placed the nail by hand easily down.   Once the nail was completely seated, I placed a guidepin into the femoral head into the center center position through the lateral incision.  I measured the length, and then reamed the lateral cortex and up into the head. I then placed the two interlocking lag screws. Slight compression was applied. Anatomic fixation achieved. Bone quality was poor.  I then secured the proximal interlocking screws.  I then placed 2 distal interlocking screws in a perfect circle technique with use of C-arm fluoroscopy.  I then removed the instruments, and took final C-arm pictures AP and lateral the entire length of the leg. Anatomic reconstruction was achieved, and the wounds were irrigated copiously and closed with Vicryl  followed by staples and dry sterile dressing. Sponge and needle count were correct.   The patient was awakened and returned to PACU in stable and satisfactory condition. There no complications and the patient tolerated the procedure well.   POSTOPERATIVE PLAN: She will be weightbearing as tolerated.   Ok to start DVT ppx POD#1 - lovenox 40mg  daily x 14 days Dressing change by nursing staff as needed to keep dressing clean and dry Outpatient f/u in clinic in 2 weeks for staple removal and xrays  

## 2021-08-27 NOTE — Progress Notes (Signed)
Initial Nutrition Assessment  DOCUMENTATION CODES:   Non-severe (moderate) malnutrition in context of chronic illness, Underweight  INTERVENTION:   Once diet has been advanced, add:  -Ensure Enlive po BID, each supplement provides 350 kcal and 20 grams of protein -MVI with minerals daily  NUTRITION DIAGNOSIS:   Moderate Malnutrition related to chronic illness (CHF) as evidenced by mild fat depletion, moderate fat depletion, mild muscle depletion, moderate muscle depletion.  GOAL:   Patient will meet greater than or equal to 90% of their needs  MONITOR:   PO intake, Supplement acceptance, Diet advancement  REASON FOR ASSESSMENT:   Consult Assessment of nutrition requirement/status, Hip fracture protocol  ASSESSMENT:   Pt with medical history significant of   HTN, GERD, impaired fasting glucose, Afib not on anticoagulation, CHFpef, depression acute comminuted displaced and angulated proximal femoral hip fracture.  Pt admitted with rt hip fracture.   Reviewed I/O's: -150 ml x 24 hours  UOP: 200 ml x 24 hours  Orthopedics consult pending, Plan for surgery today. Pt is currently NPO for procedure.  Spoke with pt and son at bedside. Pt reports good appetite. She has resided at [facility] for the past years and has a good appetite, consuming 3 meals per day which are provided to her. Pt shares that breakfast is her best meal and typically eats very well at this meal. Pt son reports family members also provide her snacks, such as peanut butter crackers. Pt admits to consuming 1-2 Ensure supplements PTA and would like to continue supplements to help her recover from surgery.   Pt reports weight gain over the past year. She was 80 pounds at her lowest and now weighs around 101#. Reviewed wt hx; noted progressive wt gain over the past year.   Discussed importance of good meal and supplement intake to promote healing.   Medications reviewed and include colace, remeron, and  senokot.   Labs reviewed: CBGS: 100 (inpatient orders for glycemic control are none).    NUTRITION - FOCUSED PHYSICAL EXAM:  Flowsheet Row Most Recent Value  Orbital Region Moderate depletion  Upper Arm Region Mild depletion  Thoracic and Lumbar Region Mild depletion  Buccal Region Mild depletion  Temple Region Moderate depletion  Clavicle Bone Region Moderate depletion  Clavicle and Acromion Bone Region Moderate depletion  Scapular Bone Region Moderate depletion  Dorsal Hand Moderate depletion  Patellar Region Moderate depletion  Anterior Thigh Region Moderate depletion  Posterior Calf Region Moderate depletion  Edema (RD Assessment) None  Hair Reviewed  Eyes Reviewed  Mouth Reviewed  Skin Reviewed  Nails Reviewed       Diet Order:   Diet Order             Diet NPO time specified Except for: Sips with Meds  Diet effective now                   EDUCATION NEEDS:   Education needs have been addressed  Skin:  Skin Assessment: Reviewed RN Assessment  Last BM:  08/26/21 (type 6)  Height:   Ht Readings from Last 1 Encounters:  08/27/21 5\' 3"  (1.6 m)    Weight:   Wt Readings from Last 1 Encounters:  08/27/21 46.9 kg    Ideal Body Weight:  52.3 kg  BMI:  Body mass index is 18.32 kg/m.  Estimated Nutritional Needs:   Kcal:  1400-1600  Protein:  60-75 grams  Fluid:  > 1.4 L    08/29/21, RD, LDN,  CDCES Registered Dietitian II Certified Diabetes Care and Education Specialist Please refer to [facility] for RD and/or RD on-call/weekend/after hours pager

## 2021-08-27 NOTE — Progress Notes (Signed)
PT Cancellation Note  Patient Details Name: DAJAH FISCHMAN MRN: [id number] DOB: 09/12/1922   Cancelled Treatment:    Reason Eval/Treat Not Completed: Patient not medically ready. Orders received and chart reviewed. Per EMR and RN, pt planning for orthopedic surgery for L hip fracture this morning. PT to hold until complete and medically appropriate.    Delphia Grates. Fairly IV, PT, DPT Physical Therapist- [place]  [facility]  08/27/2021, 8:14 AM

## 2021-08-27 NOTE — Consult Note (Signed)
ORTHOPAEDIC CONSULTATION  REQUESTING PHYSICIAN: Tresa Moore, MD  Chief Complaint: Right hip pain  HPI: Vanessa Brock is a 88 y.o. female who complains of right hip pain after mechanical fall. The pain is sharp in character. The pain is worse with movement and better with rest. Denies any numbness, tingling or constitutional symptoms.  The patient is accompanied by family.  [place] community.  She ambulates with a walker and is not on any anticoagulation.  X-rays in the ER showed presence of a displaced right proximal femur fracture.  Orthopedics was consulted regarding further management.  Past Medical History:  Diagnosis Date   AKI (acute kidney injury) (HCC)    a. 02/2018 in setting of hip fx -->improved w/ IVF.   Closed displaced fracture of left femoral neck (HCC)    a. 02/2018 s/p ORIF.   Diastolic dysfunction    a. 02/2018 Limited Echo: EF > 65%, impaired diast relaxation. Nl RV fxn.    GERD (gastroesophageal reflux disease)    Hypertension    Impaired fasting glucose    Persistent atrial fibrillation (HCC)    a. 02/2018 Dx following ORIF L hip 2/2 fx; b. CHA2DS2VASc = 4-->Eliquis.   Past Surgical History:  Procedure Laterality Date   ABDOMINAL HYSTERECTOMY     CHOLECYSTECTOMY     HIP SURGERY  02/06/2018   INTRAMEDULLARY (IM) NAIL INTERTROCHANTERIC Left 02/06/2018   Procedure: INTRAMEDULLARY (IM) NAIL INTERTROCHANTRIC;  Surgeon: Donato Heinz, MD;  Location: [facility] ORS;  Service: Orthopedics;  Laterality: Left;   Social History   Socioeconomic History   Marital status: Divorced    Spouse name: Not on file   Number of children: Not on file   Years of education: Not on file   Highest education level: Not on file  Occupational History   Not on file  Tobacco Use   Smoking status: Never   Smokeless tobacco: Never  Vaping Use   Vaping Use: Not on file  Substance and Sexual Activity   Alcohol use: No   Drug use: No   Sexual activity: Never   Other Topics Concern   Not on file  Social History Narrative   Was living by herself in [place] - family nearby.    Social Determinants of Health   Financial Resource Strain: Not on file  Food Insecurity: Not on file  Transportation Needs: Not on file  Physical Activity: Not on file  Stress: Not on file  Social Connections: Not on file   Family History  Problem Relation Age of Onset   Heart disease Mother        died @ 62 - ? heart issues   Other Father        died @ 19   Other Sister        13   Allergies  Allergen Reactions   Elemental Sulfur Hives   Sulfur Hives   Aspirin Nausea And Vomiting and Nausea Only   Prior to Admission medications   Medication Sig Start Date End Date Taking? Authorizing Provider  Calcium Carb-Cholecalciferol (CALCIUM-VITAMIN D) 500-200 MG-UNIT tablet Take 1 tablet by mouth 2 (two) times daily.   Yes [provider]  hydrALAZINE (APRESOLINE) 10 MG tablet Take 10 mg by mouth 3 (three) times daily. 07/07/21  Yes [provider]  Magnesium Glycinate 100 MG CAPS Take 2 capsules by mouth daily.   Yes [provider]  metoprolol tartrate (LOPRESSOR) 25 MG tablet Take 0.5 tablets (12.5 mg  total) by mouth 2 (two) times daily. 07/21/21  Yes Enedina Finner, MD  mirtazapine (REMERON) 7.5 MG tablet Take 7.5 mg by mouth at bedtime. 02/06/21  Yes [provider]  omeprazole (PRILOSEC) 20 MG capsule Take 1 capsule (20 mg total) by mouth daily. 07/09/20  Yes Medina-Vargas, Monina C, NP  senna (SENOKOT) 8.6 MG tablet Take 1 tablet by mouth daily.   Yes [provider]  TIADYLT ER 300 MG 24 hr capsule Take 300 mg by mouth daily. 02/06/21  Yes [provider]  Docusate Sodium (DSS) 100 MG CAPS Take 1 capsule by mouth 2 (two) times daily as needed. Patient not taking: Reported on 08/27/2021    [provider]  loperamide (IMODIUM) 2 MG capsule Take 4 mg by mouth as needed for diarrhea or loose stools. Patient not  taking: Reported on 08/27/2021    [provider]  ondansetron (ZOFRAN ODT) 4 MG disintegrating tablet Take 1 tablet (4 mg total) by mouth every 8 (eight) hours as needed for nausea or vomiting. Patient not taking: Reported on 08/27/2021 09/06/20   Shaune Pollack, MD  phenylephrine-shark liver oil-mineral oil-petrolatum (PREPARATION H) 0.25-14-74.9 % rectal ointment Place 1 Application rectally every 6 (six) hours as needed for hemorrhoids.    [provider]  polyethylene glycol (MIRALAX / GLYCOLAX) 17 g packet Take 17 g by mouth daily. Patient not taking: Reported on 08/27/2021    [provider]   DG Chest Portable 1 View  Result Date: 08/26/2021 CLINICAL DATA:  Fall with hip fracture EXAM: PORTABLE CHEST 1 VIEW COMPARISON:  09/06/2020 FINDINGS: Mild cardiomegaly. No acute airspace disease. Apical scarring. Aortic atherosclerosis. IMPRESSION: No active disease.  Mild cardiomegaly Electronically Signed   By: Jasmine Pang M.D.   On: 08/26/2021 20:22   DG Hip Unilat W or Wo Pelvis 2-3 Views Right  Result Date: 08/26/2021 CLINICAL DATA:  Fall EXAM: DG HIP (WITH OR WITHOUT PELVIS) 2-3V RIGHT COMPARISON:  06/17/2020 FINDINGS: SI joints are non widened. Chronic left pubic rami fractures. Intramedullary rod in the left femur. Acute comminuted fracture involves the lower trochanteric femur as well as the subtrochanteric shaft. Displaced lesser trochanteric fracture fragment. Moderate apex lateral and anterior angulation. No femoral head dislocation IMPRESSION: 1. Acute comminuted displaced and angulated proximal femoral fracture involves the lower trochanteric region and subtrochanteric femur 2. Old left pubic rami fractures Electronically Signed   By: Jasmine Pang M.D.   On: 08/26/2021 20:20    Positive ROS: All other systems have been reviewed and were otherwise negative with the exception of those mentioned in the HPI and as above.  Physical Exam: General: Alert, no acute  distress Cardiovascular: No pedal edema Respiratory: No cyanosis, no use of accessory musculature GI: No organomegaly, abdomen is soft and non-tender Skin: No lesions in the area of chief complaint Neurologic: Sensation intact distally Psychiatric: Patient is competent for consent with normal mood and affect Lymphatic: No axillary or cervical lymphadenopathy  MUSCULOSKELETAL:  Right lower extremity: There is tenderness to palpation about the right hip, no tenderness about the knee or ankle, range of motion deferred, compartments soft. Good cap refill. Motor and sensory intact distally.  Assessment: 88 year old female admitted with a displaced right proximal femur fracture  Plan: I had a long discussion with the patient as well as her family regarding the nature of her fracture as well as both operative and nonoperative treatment options.  Patient has undergone prior left femur IM nail a few years ago and did  well through recovery.  We discussed risks and benefits and patient elected to proceed with right femur intramedullary nail.  All questions were addressed and informed consent was provided.    Ross Marcus, MD    08/27/2021 12:52 PM

## 2021-08-28 ENCOUNTER — Encounter: Payer: Self-pay | Admitting: Orthopaedic Surgery

## 2021-08-28 DIAGNOSIS — S72002D Fracture of unspecified part of neck of left femur, subsequent encounter for closed fracture with routine healing: Secondary | ICD-10-CM | POA: Diagnosis not present

## 2021-08-28 DIAGNOSIS — N179 Acute kidney failure, unspecified: Secondary | ICD-10-CM

## 2021-08-28 DIAGNOSIS — D62 Acute posthemorrhagic anemia: Secondary | ICD-10-CM | POA: Diagnosis not present

## 2021-08-28 LAB — CBC
HCT: 24.7 % — ABNORMAL LOW (ref 36.0–46.0)
Hemoglobin: 8.1 g/dL — ABNORMAL LOW (ref 12.0–15.0)
MCH: 31.6 pg (ref 26.0–34.0)
MCHC: 32.8 g/dL (ref 30.0–36.0)
MCV: 96.5 fL (ref 80.0–100.0)
Platelets: 219 10*3/uL (ref 150–400)
RBC: 2.56 MIL/uL — ABNORMAL LOW (ref 3.87–5.11)
RDW: 13.2 % (ref 11.5–15.5)
WBC: 11.9 10*3/uL — ABNORMAL HIGH (ref 4.0–10.5)
nRBC: 0 % (ref 0.0–0.2)

## 2021-08-28 LAB — BASIC METABOLIC PANEL
Anion gap: 6 (ref 5–15)
BUN: 40 mg/dL — ABNORMAL HIGH (ref 8–23)
CO2: 25 mmol/L (ref 22–32)
Calcium: 8.6 mg/dL — ABNORMAL LOW (ref 8.9–10.3)
Chloride: 105 mmol/L (ref 98–111)
Creatinine, Ser: 1.71 mg/dL — ABNORMAL HIGH (ref 0.44–1.00)
GFR, Estimated: 27 mL/min — ABNORMAL LOW (ref >60)
Glucose, Bld: 164 mg/dL — ABNORMAL HIGH (ref 70–99)
Potassium: 4.4 mmol/L (ref 3.5–5.1)
Sodium: 136 mmol/L (ref 135–145)

## 2021-08-28 MED ORDER — HEPARIN SODIUM (PORCINE) 5000 UNIT/ML IJ SOLN
5000.0000 [IU] | Freq: Three times a day (TID) | INTRAMUSCULAR | Status: DC
  Administered 2021-08-28 – 2021-08-29 (×3): 5000 [IU] via SUBCUTANEOUS
  Filled 2021-08-28 (×3): qty 1

## 2021-08-28 NOTE — Anesthesia Postprocedure Evaluation (Signed)
Anesthesia Post Note  Patient: Vanessa Brock  Procedure(s) Performed: INTRAMEDULLARY (IM) NAIL FEMORAL (Right: Hip)  Patient location during evaluation: Nursing Unit Anesthesia Type: Spinal Level of consciousness: awake and alert Pain management: pain level controlled Respiratory status: spontaneous breathing Anesthetic complications: no   No notable events documented.   Last Vitals:  Vitals:   08/27/21 2054 08/28/21 0328  BP: (!) 108/59 115/62  Pulse: 95 (!) 105  Resp: 16 16  Temp: 36.6 C 36.8 C  SpO2: 97% 99%    Last Pain:  Vitals:   08/28/21 0342  TempSrc:   PainSc: 6                  Jaye Beagle

## 2021-08-28 NOTE — Progress Notes (Signed)
PROGRESS NOTE    Vanessa Brock  JTT:[id number] DOB: 1922-01-10 DOA: 08/26/2021 PCP: [facility]    Brief Narrative:  88 y.o. female with medical history significant of  HTN, GERD, impaired fasting glucose, Afib not on anticoagulation, CHFpef, depression acute comminuted displaced and angulated proximal femoral fracture involves the lower trochanteric region and subtrochanteric Femur s/p repair which was complicated by new onset afib. Patient also has hx of admission on for gastro enteritis due to norovirus. Patient now  presents to ED s/p mechanical fall with associated complaint of injury to left leg with pain and noted deformity of leg. Patient on ros denies abdominal pain/ dysuria/ fever chills,sob/ chest pain n/v does note mild constipation. Notes pain in right leg.  Seen in consultation by orthopedic surgery.  Plan for operative fixation on 8/24   Assessment & Plan:   Principal Problem:   Closed left hip fracture [facility]) Active Problems:   Chronic diastolic CHF (congestive heart failure) (HCC)   Paroxysmal atrial fibrillation (HCC)   Fall   Leukocytosis   Malnutrition of moderate degree  Right hip fracture Status post mechanical fall No evidence of syncopal event Pain mild to moderate Status post the OR with orthopedics 8/24 Plan: Okay for diet Chemoprophylaxis PT and OT Pain control Will need skilled nursing facility placement  AKI Noted postoperatively Likely prerenal azotemia Plan: IVF Encouraged p.o. fluid intake Recheck creatinine in a.m.  Acute postoperative blood loss anemia Hemodynamically stable Currently no indication for transfusion Plan: Check a.m. CBC We will transfuse if hemoglobin drops less than 8  Leukocytosis Suspect stress response No evidence of infection No indication for antibiotics Monitor vitals and fever curve  History of atrial fibrillation Suspect paroxysmal Current sinus rhythm No anticoagulation, presumably  due to fall risk and advanced age Continue telemetry for now Continue home metoprolol  History of congestive heart failure with preserved ejection fraction No evidence of exacerbation  Essential hypertension Blood pressure controlled Resume home regimen  GERD PPI  History of recurrent falls PT and OT starting postoperative day #1  DVT prophylaxis: SCD Code Status: DNR, confirmed with patient and son at bedside Family Communication: Son at bedside 8/24, 8/25 Disposition Plan: Status is: Inpatient Remains inpatient appropriate because: Hip fracture.  Postoperative day #1.  PT OT.  IV fluids.  Pain control.  Will need skilled nursing facility.   Level of care: Telemetry Medical  Consultants:  Orthopedics  Procedures:  Hip fracture repair 8/24  Antimicrobials: None   Subjective: Seen and examined.  Sitting up in chair.  Pain mild to moderate.  No visible distress.  Son at bedside.  Objective: Vitals:   08/27/21 1656 08/27/21 2054 08/28/21 0328 08/28/21 0733  BP: 134/67 (!) 108/59 115/62 (!) 147/61  Pulse: 79 95 (!) 105 (!) 104  Resp: 16 16 16 16   Temp: (!) 97.4 F (36.3 C) 97.9 F (36.6 C) 98.3 F (36.8 C) 98.2 F (36.8 C)  TempSrc:      SpO2: 98% 97% 99% 95%  Weight:      Height:        Intake/Output Summary (Last 24 hours) at 08/28/2021 1026 Last data filed at 08/28/2021 0659 Gross per 24 hour  Intake 550 ml  Output 570 ml  Net -20 ml   Filed Weights   08/26/21 2019 08/27/21 0005 08/27/21 1221  Weight: 44 kg 46.9 kg 46.9 kg    Examination:  General exam: No acute distress Respiratory system: Lungs clear.  No work of breathing.  Room air Cardiovascular system: S1-2, RRR, no murmurs, trace pedal edema bilaterally Gastrointestinal system: Soft, NT/ND, normal bowel sounds Central nervous system: Alert, oriented x2, no focal deficits Extremities: Right hip surgical dressings, not removed, dressing CDI Skin: No rashes, lesions or ulcers Psychiatry:  Judgement and insight appear normal. Mood & affect appropriate.     Data Reviewed: I have personally reviewed following labs and imaging studies  CBC: Recent Labs  Lab 08/26/21 2022 08/27/21 0012 08/27/21 0208 08/28/21 0321  WBC 19.0* 17.1* 14.6* 11.9*  NEUTROABS 14.5* 14.7*  --   --   HGB 12.2 11.4* 10.4* 8.1*  HCT 37.4 34.7* 31.2* 24.7*  MCV 96.1 95.3 95.1 96.5  PLT 260 239 228 219   Basic Metabolic Panel: Recent Labs  Lab 08/26/21 2022 08/27/21 0208 08/28/21 0321  NA 137 135 136  K 3.8 4.4 4.4  CL 103 104 105  CO2 25 26 25   GLUCOSE 181* 192* 164*  BUN 27* 27* 40*  CREATININE 1.14* 1.00 1.71*  CALCIUM 8.7* 8.5* 8.6*   GFR: Estimated Creatinine Clearance: 13.9 mL/min (A) (by C-G formula based on SCr of 1.71 mg/dL (H)). Liver Function Tests: Recent Labs  Lab 08/27/21 0012  ALBUMIN 3.7   No results for input(s): "LIPASE", "AMYLASE" in the last 168 hours. No results for input(s): "AMMONIA" in the last 168 hours. Coagulation Profile: No results for input(s): "INR", "PROTIME" in the last 168 hours. Cardiac Enzymes: No results for input(s): "CKTOTAL", "CKMB", "CKMBINDEX", "TROPONINI" in the last 168 hours. BNP (last 3 results) No results for input(s): "PROBNP" in the last 8760 hours. HbA1C: No results for input(s): "HGBA1C" in the last 72 hours. CBG: No results for input(s): "GLUCAP" in the last 168 hours. Lipid Profile: No results for input(s): "CHOL", "HDL", "LDLCALC", "TRIG", "CHOLHDL", "LDLDIRECT" in the last 72 hours. Thyroid Function Tests: No results for input(s): "TSH", "T4TOTAL", "FREET4", "T3FREE", "THYROIDAB" in the last 72 hours. Anemia Panel: No results for input(s): "VITAMINB12", "FOLATE", "FERRITIN", "TIBC", "IRON", "RETICCTPCT" in the last 72 hours. Sepsis Labs: Recent Labs  Lab 08/27/21 0012 08/27/21 0208  PROCALCITON <0.10  --   LATICACIDVEN 1.6 1.0    No results found for this or any previous visit (from the past 240 hour(s)).        Radiology Studies: DG HIP UNILAT WITH PELVIS 2-3 VIEWS RIGHT  Result Date: 08/27/2021 CLINICAL DATA:  Intramedullary rod fixation of right femur. EXAM: DG HIP (WITH OR WITHOUT PELVIS) 2-3V RIGHT; DG C-ARM 1-60 MIN-NO REPORT Radiation exposure index: 23.37 mGy. COMPARISON:  Radiographs of same day. FINDINGS: Five intraoperative fluoroscopic images were obtained of the right femur. These demonstrate intramedullary rod fixation of proximal right femoral fracture. IMPRESSION: Fluoroscopic guidance provided during intramedullary rod fixation of proximal right femoral fracture. Electronically Signed   By: 08/29/2021 M.D.   On: 08/27/2021 15:46   DG C-Arm 1-60 Min-No Report  Result Date: 08/27/2021 Fluoroscopy was utilized by the requesting physician.  No radiographic interpretation.   DG C-Arm 1-60 Min-No Report  Result Date: 08/27/2021 Fluoroscopy was utilized by the requesting physician.  No radiographic interpretation.   DG Chest Portable 1 View  Result Date: 08/26/2021 CLINICAL DATA:  Fall with hip fracture EXAM: PORTABLE CHEST 1 VIEW COMPARISON:  09/06/2020 FINDINGS: Mild cardiomegaly. No acute airspace disease. Apical scarring. Aortic atherosclerosis. IMPRESSION: No active disease.  Mild cardiomegaly Electronically Signed   By: 11/06/2020 M.D.   On: 08/26/2021 20:22   DG Hip Unilat W or Wo Pelvis 2-3  Views Right  Result Date: 08/26/2021 CLINICAL DATA:  Fall EXAM: DG HIP (WITH OR WITHOUT PELVIS) 2-3V RIGHT COMPARISON:  06/17/2020 FINDINGS: SI joints are non widened. Chronic left pubic rami fractures. Intramedullary rod in the left femur. Acute comminuted fracture involves the lower trochanteric femur as well as the subtrochanteric shaft. Displaced lesser trochanteric fracture fragment. Moderate apex lateral and anterior angulation. No femoral head dislocation IMPRESSION: 1. Acute comminuted displaced and angulated proximal femoral fracture involves the lower trochanteric region  and subtrochanteric femur 2. Old left pubic rami fractures Electronically Signed   By: Jasmine Pang M.D.   On: 08/26/2021 20:20        Scheduled Meds:  acetaminophen  500 mg Oral Q6H   docusate sodium  100 mg Oral BID   heparin injection (subcutaneous)  5,000 Units Subcutaneous Q8H   hydrALAZINE  10 mg Oral TID   metoprolol tartrate  12.5 mg Oral BID   mirtazapine  7.5 mg Oral QHS   pantoprazole  40 mg Oral Daily   senna  8.6 mg Oral Daily   Continuous Infusions:  sodium chloride 100 mL/hr at 08/28/21 0031    ceFAZolin (ANCEF) IV 1 g (08/27/21 2134)     LOS: 2 days     Tresa Moore, MD [facility]   If 7PM-7AM, please contact night-coverage  08/28/2021, 10:26 AM

## 2021-08-28 NOTE — Progress Notes (Signed)
Urine output was 70 cc for the shift. Foley wasn't removed. No complaint of abdominal pain or discomfort.   Bladder scan volume 0.

## 2021-08-28 NOTE — Progress Notes (Signed)
Notified Dr Joylene Igo of low urine output of 20cc this shift. Received new order for IVF

## 2021-08-28 NOTE — Plan of Care (Signed)
  Problem: Education: Goal: Knowledge of General Education information will improve Description: Including pain rating scale, medication(s)/side effects and non-pharmacologic comfort measures Outcome: Progressing   Problem: Health Behavior/Discharge Planning: Goal: Ability to manage health-related needs will improve Outcome: Progressing   Problem: Clinical Measurements: Goal: Ability to maintain clinical measurements within normal limits will improve Outcome: Progressing Goal: Will remain free from infection Outcome: Progressing Goal: Diagnostic test results will improve Outcome: Progressing Goal: Respiratory complications will improve Outcome: Progressing Goal: Cardiovascular complication will be avoided Outcome: Progressing   Problem: Activity: Goal: Risk for activity intolerance will decrease Outcome: Progressing   Problem: Nutrition: Goal: Adequate nutrition will be maintained Outcome: Progressing   Problem: Coping: Goal: Level of anxiety will decrease Outcome: Progressing   Problem: Elimination: Goal: Will not experience complications related to bowel motility Outcome: Progressing Goal: Will not experience complications related to urinary retention Outcome: Progressing   Problem: Pain Managment: Goal: General experience of comfort will improve Outcome: Progressing   Problem: Safety: Goal: Ability to remain free from injury will improve Outcome: Progressing   Problem: Skin Integrity: Goal: Risk for impaired skin integrity will decrease Outcome: Progressing   Problem: Education: Goal: Verbalization of understanding the information provided (i.e., activity precautions, restrictions, etc) will improve Outcome: Progressing Goal: Individualized Educational Video(s) Outcome: Progressing   Problem: Activity: Goal: Ability to ambulate and perform ADLs will improve Outcome: Progressing   Problem: Clinical Measurements: Goal: Postoperative complications will be  avoided or minimized Outcome: Progressing   Problem: Self-Concept: Goal: Ability to maintain and perform role responsibilities to the fullest extent possible will improve Outcome: Progressing   Problem: Pain Management: Goal: Pain level will decrease Outcome: Progressing   Problem: Clinical Measurements: Goal: Postoperative complications will be avoided or minimized Outcome: Progressing   Problem: Pain Management: Goal: Pain level will decrease with appropriate interventions Outcome: Progressing   Problem: Skin Integrity: Goal: Will show signs of wound healing Outcome: Progressing   

## 2021-08-28 NOTE — Progress Notes (Signed)
     Subjective: 1 Day Post-Op Procedure(s) (LRB): INTRAMEDULLARY (IM) NAIL FEMORAL (Right)   Patient reports pain as mild, but controlled with medication. No events throughout the night other than bleeding and having to change the dressing multiple times.      Objective:   VITALS:   Vitals:   08/28/21 0328 08/28/21 0733  BP: 115/62 (!) 147/61  Pulse: (!) 105 (!) 104  Resp: 16 16  Temp: 98.3 F (36.8 C) 98.2 F (36.8 C)  SpO2: 99% 95%    Neurovascular intact Dorsiflexion/Plantar flexion intact Incision: moderate drainage, only on the proximal incision No cellulitis present Compartment soft  LABS Recent Labs    08/27/21 0012 08/27/21 0208 08/28/21 0321  HGB 11.4* 10.4* 8.1*  HCT 34.7* 31.2* 24.7*  WBC 17.1* 14.6* 11.9*  PLT 239 228 219    Recent Labs    08/26/21 2022 08/27/21 0208 08/28/21 0321  NA 137 135 136  K 3.8 4.4 4.4  BUN 27* 27* 40*  CREATININE 1.14* 1.00 1.71*  GLUCOSE 181* 192* 164*     Assessment/Plan: 1 Day Post-Op Procedure(s) (LRB): INTRAMEDULLARY (IM) NAIL FEMORAL (Right)   Advance diet Up with therapy Proximal dressing changed to Xeroform, gauze and ABD to add pressure and help decrease bleeding,    POSTOPERATIVE PLAN: She will be weightbearing as tolerated.   Ok to start DVT ppx POD#1 - lovenox 40mg  daily x 14 days Dressing change by nursing staff as needed to keep dressing clean and dry Outpatient f/u in clinic in 2 weeks for staple removal and xrays      Dalene Robards PA-C  [place] [facility]

## 2021-08-28 NOTE — Evaluation (Signed)
Occupational Therapy Evaluation Patient Details Name: Vanessa Brock MRN: [id number] DOB: Jul 03, 1922 Today's Date: 08/28/2021   History of Present Illness Patient is a 88 yo female s/p R  hip IM nailing. PMH of L hip ORIF, GERD, HTN, afib   Clinical Impression   Pt seen for OT evaluation this date, POD#1 from above surgery. Pt was living at an ALF using RW for mobility and supervision for bathing for safety. Pt is eager to get better. Pt currently requires MAX A for LB dressing and bathing while in seated position due to pain and limited AROM of R hip. Pt instructed in ADL transfers and RW mgt requiring MOD VC with transfers from recliner and BSC with VC for safety as well due to attempting to sit prematurely. Pt would benefit from additional instruction in self care skills and techniques to help maintain precautions with or without assistive devices to support recall and carryover prior to discharge. Recommend SNF upon discharge.    Recommendations for follow up therapy are one component of a multi-disciplinary discharge planning process, led by the attending physician.  Recommendations may be updated based on patient status, additional functional criteria and insurance authorization.   Follow Up Recommendations  Skilled nursing-short term rehab (<3 hours/day)    Assistance Recommended at Discharge Frequent or constant Supervision/Assistance  Patient can return home with the following A lot of help with walking and/or transfers;A lot of help with bathing/dressing/bathroom;Assistance with cooking/housework;Assist for transportation;Help with stairs or ramp for entrance;Direct supervision/assist for medications management    Functional Status Assessment  Patient has had a recent decline in their functional status and demonstrates the ability to make significant improvements in function in a reasonable and predictable amount of time.  Equipment Recommendations  BSC/3in1    Recommendations for  Other Services       Precautions / Restrictions Precautions Precautions: Fall Restrictions Weight Bearing Restrictions: Yes RLE Weight Bearing: Weight bearing as tolerated      Mobility Bed Mobility               General bed mobility comments: NT, up in recliner    Transfers Overall transfer level: Needs assistance Equipment used: Rolling walker (2 wheels) Transfers: Sit to/from Stand, Bed to chair/wheelchair/BSC Sit to Stand: Min assist, Mod assist     Step pivot transfers: Mod assist     General transfer comment: MOD VC for sequencing, safety as she attempts to sit prematurely      Balance Overall balance assessment: Needs assistance Sitting-balance support: Feet supported Sitting balance-Leahy Scale: Fair     Standing balance support: Reliant on assistive device for balance, Bilateral upper extremity supported Standing balance-Leahy Scale: Poor Standing balance comment: heavily reliant on RW                           ADL either performed or assessed with clinical judgement   ADL                                         General ADL Comments: Pt currently requires MAX A for LB ADL tasks and MOD A for ADL transfers with RW     Vision         Perception     Praxis      Pertinent Vitals/Pain Pain Assessment Pain Assessment: 0-10 Pain Score: 4  Pain Location:  R hip Pain Descriptors / Indicators: Aching, Grimacing, Sore, Moaning Pain Intervention(s): Limited activity within patient's tolerance, Monitored during session, Repositioned, Premedicated before session     Hand Dominance     Extremity/Trunk Assessment Upper Extremity Assessment Upper Extremity Assessment: Generalized weakness   Lower Extremity Assessment Lower Extremity Assessment: Generalized weakness       Communication Communication Communication: No difficulties   Cognition Arousal/Alertness: Awake/alert Behavior During Therapy: WFL for tasks  assessed/performed Overall Cognitive Status: Within Functional Limits for tasks assessed                                       General Comments       Exercises Other Exercises Other Exercises: Pt instructed in ADL transfers, RW mgt   Shoulder Instructions      Home Living Family/patient expects to be discharged to:: Assisted living ([facility])                             Home Equipment: Agricultural consultant (2 wheels)          Prior Functioning/Environment Prior Level of Function : Needs assist       Physical Assist : ADLs (physical)   ADLs (physical): IADLs;Bathing Mobility Comments: modI with RW ADLs Comments: supervision/set up for bathing with staff at [facility]        OT Problem List: Decreased strength;Pain;Decreased range of motion;Decreased activity tolerance;Decreased safety awareness;Impaired balance (sitting and/or standing);Decreased knowledge of use of DME or AE      OT Treatment/Interventions: Self-care/ADL training;Therapeutic exercise;Therapeutic activities;DME and/or AE instruction;Balance training;Patient/family education    OT Goals(Current goals can be found in the care plan section) Acute Rehab OT Goals Patient Stated Goal: get better at rehab OT Goal Formulation: With patient/family Time For Goal Achievement: 09/11/21 Potential to Achieve Goals: Good ADL Goals Pt Will Perform Lower Body Dressing: with min assist;sit to/from stand;with adaptive equipment Pt Will Transfer to Toilet: with min guard assist;ambulating (LRAD) Pt Will Perform Toileting - Clothing Manipulation and hygiene: sitting/lateral leans;with supervision;with set-up Additional ADL Goal #1: Pt will independently instruct family/caregiver in compression stocking mgt.  OT Frequency: Min 2X/week    Co-evaluation              AM-PAC OT "6 Clicks" Daily Activity     Outcome Measure Help from another person eating meals?: None Help from another  person taking care of personal grooming?: None Help from another person toileting, which includes using toliet, bedpan, or urinal?: A Lot Help from another person bathing (including washing, rinsing, drying)?: A Lot Help from another person to put on and taking off regular upper body clothing?: A Little Help from another person to put on and taking off regular lower body clothing?: A Lot 6 Click Score: 17   End of Session Equipment Utilized During Treatment: Rolling walker (2 wheels)  Activity Tolerance: Patient tolerated treatment well Patient left: with call bell/phone within reach;in chair;with chair alarm set  OT Visit Diagnosis: Other abnormalities of gait and mobility (R26.89);Muscle weakness (generalized) (M62.81);Pain Pain - Right/Left: Right Pain - part of body: Hip                Time: 7829-5621 OT Time Calculation (min): 17 min Charges:  OT General Charges $OT Visit: 1 Visit OT Evaluation $OT Eval Moderate Complexity: 1 Mod OT Treatments $Self Care/Home Management :  8-22 mins  Arman Filter., MPH, MS, OTR/L ascom [phone] 08/28/21, 1:28 PM

## 2021-08-28 NOTE — Plan of Care (Signed)

## 2021-08-28 NOTE — Evaluation (Signed)
Physical Therapy Evaluation Patient Details Name: Vanessa Brock MRN: [id number] DOB: 03-Feb-1922 Today's Date: 08/28/2021  History of Present Illness  Patient is a 88 yo female s/p R  hip IM nailing. PMH of L hip ORIF, GERD, HTN, afib  Clinical Impression  Patient alert, reported 10/10 in RLE pain, premedicated prior to PT entrance. Pt stated at baseline she is ambulatory with RW (modI) at [facility] who provides meals/cleaning and supervision/set up for bathing.   The patient needed modA for bed mobility due to increased pain. Sit <> stand with minA and RW, pt fearful/anxious. Step pivot to recliner in room with constant cues for technique/sequencing and BLE placement with poor carryover, min-modA. Pt did experience 1 LOB modA to correct and needed assistance with RW management.  Overall the patient demonstrated deficits (see "PT Problem List") that impede the patient's functional abilities, safety, and mobility and would benefit from skilled PT intervention. Recommendation at this time is SNF due to current level of assistance needed and change from PLOF.        Recommendations for follow up therapy are one component of a multi-disciplinary discharge planning process, led by the attending physician.  Recommendations may be updated based on patient status, additional functional criteria and insurance authorization.  Follow Up Recommendations Skilled nursing-short term rehab (<3 hours/day) Can patient physically be transported by private vehicle: No    Assistance Recommended at Discharge Frequent or constant Supervision/Assistance  Patient can return home with the following  A lot of help with walking and/or transfers;A lot of help with bathing/dressing/bathroom;Assistance with feeding;Assistance with cooking/housework;Help with stairs or ramp for entrance;Assist for transportation    Equipment Recommendations None recommended by PT  Recommendations for Other Services       Functional Status  Assessment Patient has had a recent decline in their functional status and demonstrates the ability to make significant improvements in function in a reasonable and predictable amount of time.     Precautions / Restrictions Precautions Precautions: Fall Restrictions Weight Bearing Restrictions: Yes RLE Weight Bearing: Weight bearing as tolerated      Mobility  Bed Mobility Overal bed mobility: Needs Assistance Bed Mobility: Supine to Sit     Supine to sit: Mod assist, HOB elevated          Transfers Overall transfer level: Needs assistance Equipment used: Rolling walker (2 wheels) Transfers: Sit to/from Stand, Bed to chair/wheelchair/BSC Sit to Stand: Min assist   Step pivot transfers: Mod assist       General transfer comment: intermittent assistance for RW assist, modA  for small LOB, very effortful for pt to move RLE    Ambulation/Gait               General Gait Details: pt deferred due to elevated pain this AM  Stairs            Wheelchair Mobility    Modified Rankin (Stroke Patients Only)       Balance Overall balance assessment: Needs assistance Sitting-balance support: Feet supported Sitting balance-Leahy Scale: Fair     Standing balance support: Reliant on assistive device for balance Standing balance-Leahy Scale: Poor                               Pertinent Vitals/Pain Pain Assessment Pain Assessment: 0-10 Pain Score: 10-Worst pain ever Pain Location: R hip Pain Descriptors / Indicators: Aching, Grimacing, Sore, Moaning Pain Intervention(s): Limited activity within patient's  tolerance, Monitored during session, Repositioned    Home Living Family/patient expects to be discharged to:: Assisted living ([facility] [place])                 Home Equipment: Agricultural consultant (2 wheels)      Prior Function Prior Level of Function : Needs assist       Physical Assist : ADLs (physical)   ADLs (physical):  IADLs;Bathing Mobility Comments: modI with RW ADLs Comments: supervision/set up for bathing [facility] staff [facility]     Hand Dominance        Extremity/Trunk Assessment   Upper Extremity Assessment Upper Extremity Assessment: Defer to OT evaluation    Lower Extremity Assessment Lower Extremity Assessment: Generalized weakness       Communication   Communication: No difficulties  Cognition Arousal/Alertness: Awake/alert Behavior During Therapy: WFL for tasks assessed/performed, Anxious Overall Cognitive Status: Within Functional Limits for tasks assessed                                          General Comments      Exercises     Assessment/Plan    PT Assessment Patient needs continued PT services  PT Problem List Decreased strength;Decreased mobility;Decreased activity tolerance;Decreased balance;Pain;Decreased knowledge of use of DME;Decreased knowledge of precautions;Decreased range of motion       PT Treatment Interventions DME instruction;Therapeutic exercise;Gait training;Balance training;Stair training;Neuromuscular re-education;Functional mobility training;Therapeutic activities;Patient/family education    PT Goals (Current goals can be found in the Care Plan section)  Acute Rehab PT Goals Patient Stated Goal: to go home PT Goal Formulation: With patient Time For Goal Achievement: 09/11/21 Potential to Achieve Goals: Good    Frequency 7X/week     Co-evaluation               AM-PAC PT "6 Clicks" Mobility  Outcome Measure Help needed turning from your back to your side while in a flat bed without using bedrails?: A Little Help needed moving from lying on your back to sitting on the side of a flat bed without using bedrails?: A Little Help needed moving to and from a bed to a chair (including a wheelchair)?: A Little Help needed standing up from a chair using your arms (e.g., wheelchair or bedside chair)?: A Little Help needed  to walk in hospital room?: A Lot Help needed climbing 3-5 steps with a railing? : Total 6 Click Score: 15    End of Session Equipment Utilized During Treatment: Gait belt Activity Tolerance: Patient limited by pain Patient left: in chair;with chair alarm set;with call bell/phone within reach Nurse Communication: Mobility status PT Visit Diagnosis: Other abnormalities of gait and mobility (R26.89);Muscle weakness (generalized) (M62.81);Difficulty in walking, not elsewhere classified (R26.2);Pain Pain - Right/Left: Right Pain - part of body: Hip    Time: 0973-5329 PT Time Calculation (min) (ACUTE ONLY): 18 min   Charges:   PT Evaluation $PT Eval Low Complexity: 1 Low PT Treatments $Therapeutic Activity: 8-22 mins        Olga Coaster PT, DPT 9:36 AM,08/28/21

## 2021-08-28 NOTE — Progress Notes (Signed)
Bladder scan volume 0cc

## 2021-08-28 NOTE — Care Management Important Message (Signed)
Important Message  Patient Details  Name: Vanessa Brock MRN: [id number] Date of Birth: [birth year]   Medicare Important Message Given:  N/A - LOS <3 / Initial given by admissions     [name] A [name] 08/28/2021, 1:53 PM

## 2021-08-29 DIAGNOSIS — S72002D Fracture of unspecified part of neck of left femur, subsequent encounter for closed fracture with routine healing: Secondary | ICD-10-CM | POA: Diagnosis not present

## 2021-08-29 LAB — BASIC METABOLIC PANEL
Anion gap: 6 (ref 5–15)
BUN: 37 mg/dL — ABNORMAL HIGH (ref 8–23)
CO2: 22 mmol/L (ref 22–32)
Calcium: 7.9 mg/dL — ABNORMAL LOW (ref 8.9–10.3)
Chloride: 106 mmol/L (ref 98–111)
Creatinine, Ser: 1.24 mg/dL — ABNORMAL HIGH (ref 0.44–1.00)
GFR, Estimated: 40 mL/min — ABNORMAL LOW (ref >60)
Glucose, Bld: 144 mg/dL — ABNORMAL HIGH (ref 70–99)
Potassium: 4.1 mmol/L (ref 3.5–5.1)
Sodium: 134 mmol/L — ABNORMAL LOW (ref 135–145)

## 2021-08-29 LAB — HEMOGLOBIN AND HEMATOCRIT, BLOOD
HCT: 25.6 % — ABNORMAL LOW (ref 36.0–46.0)
Hemoglobin: 8.5 g/dL — ABNORMAL LOW (ref 12.0–15.0)

## 2021-08-29 LAB — PREPARE RBC (CROSSMATCH)

## 2021-08-29 LAB — CBC
HCT: 18.3 % — ABNORMAL LOW (ref 36.0–46.0)
Hemoglobin: 6 g/dL — ABNORMAL LOW (ref 12.0–15.0)
MCH: 31.3 pg (ref 26.0–34.0)
MCHC: 32.8 g/dL (ref 30.0–36.0)
MCV: 95.3 fL (ref 80.0–100.0)
Platelets: 159 10*3/uL (ref 150–400)
RBC: 1.92 MIL/uL — ABNORMAL LOW (ref 3.87–5.11)
RDW: 13.3 % (ref 11.5–15.5)
WBC: 11.4 10*3/uL — ABNORMAL HIGH (ref 4.0–10.5)
nRBC: 0 % (ref 0.0–0.2)

## 2021-08-29 MED ORDER — ASPIRIN 81 MG PO TBEC
81.0000 mg | DELAYED_RELEASE_TABLET | Freq: Two times a day (BID) | ORAL | Status: DC
  Administered 2021-08-29 – 2021-08-31 (×5): 81 mg via ORAL
  Filled 2021-08-29 (×5): qty 1

## 2021-08-29 MED ORDER — SODIUM CHLORIDE 0.9% IV SOLUTION: Freq: Once | INTRAVENOUS | Status: DC

## 2021-08-29 MED ORDER — SODIUM CHLORIDE 0.9% IV SOLUTION: Freq: Once | INTRAVENOUS | Status: AC

## 2021-08-29 NOTE — Plan of Care (Signed)
  Problem: Education: Goal: Knowledge of General Education information will improve Description: Including pain rating scale, medication(s)/side effects and non-pharmacologic comfort measures Outcome: Progressing   Problem: Health Behavior/Discharge Planning: Goal: Ability to manage health-related needs will improve Outcome: Progressing   Problem: Clinical Measurements: Goal: Ability to maintain clinical measurements within normal limits will improve Outcome: Progressing Goal: Will remain free from infection Outcome: Progressing Goal: Diagnostic test results will improve Outcome: Progressing Goal: Respiratory complications will improve Outcome: Progressing Goal: Cardiovascular complication will be avoided Outcome: Progressing   Problem: Activity: Goal: Risk for activity intolerance will decrease Outcome: Progressing   Problem: Nutrition: Goal: Adequate nutrition will be maintained Outcome: Progressing   Problem: Coping: Goal: Level of anxiety will decrease Outcome: Progressing   Problem: Elimination: Goal: Will not experience complications related to bowel motility Outcome: Progressing Goal: Will not experience complications related to urinary retention Outcome: Progressing   Problem: Pain Managment: Goal: General experience of comfort will improve Outcome: Progressing   Problem: Safety: Goal: Ability to remain free from injury will improve Outcome: Progressing   Problem: Skin Integrity: Goal: Risk for impaired skin integrity will decrease Outcome: Progressing   Problem: Education: Goal: Verbalization of understanding the information provided (i.e., activity precautions, restrictions, etc) will improve Outcome: Progressing Goal: Individualized Educational Video(s) Outcome: Progressing   Problem: Activity: Goal: Ability to ambulate and perform ADLs will improve Outcome: Progressing   Problem: Clinical Measurements: Goal: Postoperative complications will be  avoided or minimized Outcome: Progressing   Problem: Self-Concept: Goal: Ability to maintain and perform role responsibilities to the fullest extent possible will improve Outcome: Progressing   Problem: Pain Management: Goal: Pain level will decrease Outcome: Progressing   Problem: Clinical Measurements: Goal: Postoperative complications will be avoided or minimized Outcome: Progressing   Problem: Pain Management: Goal: Pain level will decrease with appropriate interventions Outcome: Progressing   Problem: Skin Integrity: Goal: Will show signs of wound healing Outcome: Progressing

## 2021-08-29 NOTE — NC FL2 (Signed)
[place] MEDICAID FL2 LEVEL OF CARE SCREENING TOOL     IDENTIFICATION  Patient Name: Vanessa Brock Birthdate: [birth year] Sex: female Admission Date (Current Location): 08/26/2021  [facility] and [place] Number:  Chiropodist and Address:  [facility], [address], [place], [place] [address]      Provider Number:    Attending Physician Name and Address:  Tresa Moore, MD  Relative Name and Phone Number:  Berenice Bouton [phone]    Current Level of Care: Hospital Recommended Level of Care: Skilled Nursing Facility Prior Approval Number:    Date Approved/Denied:   PASRR Number: [id number]  Discharge Plan: SNF    Current Diagnoses: Patient Active Problem List   Diagnosis Date Noted   Malnutrition of moderate degree 08/27/2021   Closed left hip fracture (HCC) 08/26/2021   Nausea vomiting and diarrhea 07/20/2021   Hypertension    Atrial fibrillation, chronic (HCC)    GERD (gastroesophageal reflux disease)    Depression    Acute renal failure superimposed on stage 3a chronic kidney disease (HCC)    Myocardial injury    Hypokalemia    Chronic diastolic CHF (congestive heart failure) (HCC)    Macrocytic anemia 07/02/2020   Fall 06/25/2020   CKD (chronic kidney disease) 06/25/2020   Leukocytosis 06/25/2020   Paroxysmal atrial fibrillation (HCC) 07/21/2018   Hip fracture (HCC) 02/05/2018    Orientation RESPIRATION BLADDER Height & Weight     Self, Time, Situation, Place  Normal Incontinent Weight: 103 lb 6.3 oz (46.9 kg) Height:  5\' 3"  (160 cm)  BEHAVIORAL SYMPTOMS/MOOD NEUROLOGICAL BOWEL NUTRITION STATUS      Continent Diet  AMBULATORY STATUS COMMUNICATION OF NEEDS Skin   Extensive Assist Verbally Normal                       Personal Care Assistance Level of Assistance  Bathing, Feeding, Dressing Bathing Assistance: Maximum assistance Feeding assistance: Independent Dressing Assistance: Maximum assistance      Functional Limitations Info  Sight, Hearing, Speech Sight Info: Adequate Hearing Info: Adequate Speech Info: Adequate    SPECIAL CARE FACTORS FREQUENCY  PT (By licensed PT), OT (By licensed OT)     PT Frequency: 5x per week OT Frequency: 5x per week            Contractures Contractures Info: Not present    Additional Factors Info  Code Status Code Status Info: DNR             Current Medications (08/29/2021):  This is the current hospital active medication list Current Facility-Administered Medications  Medication Dose Route Frequency Provider Last Rate Last Admin   acetaminophen (TYLENOL) tablet 325-650 mg  325-650 mg Oral Q6H PRN 08/31/2021, MD       aspirin EC tablet 81 mg  81 mg Oral BID Ross Marcus B, MD   81 mg at 08/29/21 1145   docusate sodium (COLACE) capsule 100 mg  100 mg Oral BID 08/31/21, MD   100 mg at 08/29/21 08/31/21   hydrALAZINE (APRESOLINE) tablet 10 mg  10 mg Oral TID 8756, MD   10 mg at 08/29/21 0911   HYDROcodone-acetaminophen (NORCO) 7.5-325 MG per tablet 1-2 tablet  1-2 tablet Oral Q4H PRN 05-04-1976, MD   1 tablet at 08/28/21 2150   HYDROcodone-acetaminophen (NORCO/VICODIN) 5-325 MG per tablet 1-2 tablet  1-2 tablet Oral Q4H PRN 2151, MD   2 tablet at 08/29/21 [phone]  menthol-cetylpyridinium (CEPACOL) lozenge 3 mg  1 lozenge Oral PRN Ross Marcus, MD       Or   phenol (CHLORASEPTIC) mouth spray 1 spray  1 spray Mouth/Throat PRN Ross Marcus, MD       metoCLOPramide (REGLAN) tablet 5-10 mg  5-10 mg Oral Q8H PRN Ross Marcus, MD       Or   metoCLOPramide (REGLAN) injection 5-10 mg  5-10 mg Intravenous Q8H PRN Ross Marcus, MD       metoprolol tartrate (LOPRESSOR) tablet 12.5 mg  12.5 mg Oral BID Ross Marcus, MD   12.5 mg at 08/29/21 0910   mirtazapine (REMERON) tablet 7.5 mg  7.5 mg Oral Eugenie Norrie, MD   7.5 mg at 08/28/21 2151   morphine (PF) 2 MG/ML  injection 0.5-1 mg  0.5-1 mg Intravenous Q2H PRN Ross Marcus, MD       ondansetron [facility]) tablet 4 mg  4 mg Oral Q6H PRN Ross Marcus, MD       Or   ondansetron [facility]) injection 4 mg  4 mg Intravenous Q6H PRN Ross Marcus, MD   4 mg at 08/27/21 1710   pantoprazole (PROTONIX) EC tablet 40 mg  40 mg Oral Daily Ross Marcus, MD   40 mg at 08/29/21 6962   senna (SENOKOT) tablet 8.6 mg  8.6 mg Oral Daily Ross Marcus, MD   8.6 mg at 08/29/21 0910   senna-docusate (Senokot-S) tablet 1 tablet  1 tablet Oral QHS PRN Ross Marcus, MD         Discharge Medications: Please see discharge summary for a list of discharge medications.  Relevant Imaging Results:  Relevant Lab Results:   Additional Information SSN [id number]  Verna Czech [place], [place]

## 2021-08-29 NOTE — Progress Notes (Signed)
PT Cancellation Note  Patient Details Name: Vanessa Brock MRN: [id number] DOB: 1922-11-03   Cancelled Treatment:     PT hold this AM. Pt's Hgb 6.0 and will receive transfusion per MD. Will continue to follow and progress as able per current POC.   Rushie Chestnut 08/29/2021, 8:54 AM

## 2021-08-29 NOTE — Progress Notes (Signed)
PROGRESS NOTE    Vanessa Brock  LZJ:[id number] DOB: 07-24-1922 DOA: 08/26/2021 PCP: [facility]    Brief Narrative:  88 y.o. female with medical history significant of  HTN, GERD, impaired fasting glucose, Afib not on anticoagulation, CHFpef, depression acute comminuted displaced and angulated proximal femoral fracture involves the lower trochanteric region and subtrochanteric Femur s/p repair which was complicated by new onset afib. Patient also has hx of admission on for gastro enteritis due to norovirus. Patient now  presents to ED s/p mechanical fall with associated complaint of injury to left leg with pain and noted deformity of leg. Patient on ros denies abdominal pain/ dysuria/ fever chills,sob/ chest pain n/v does note mild constipation. Notes pain in right leg.  Seen in consultation by orthopedic surgery.  Status post operative fixation 8/24.  No immediate postoperative complications however patient's hemoglobin has dropped to 6.0 on 8/26.  Had some AKI postoperatively that has not recovered.  Transfusing 1 unit PRBC   Assessment & Plan:   Principal Problem:   Closed left hip fracture (HCC) Active Problems:   Chronic diastolic CHF (congestive heart failure) (HCC)   Paroxysmal atrial fibrillation (HCC)   Fall   Leukocytosis   Malnutrition of moderate degree  Right hip fracture Status post mechanical fall No evidence of syncopal event Pain mild to moderate Status post the OR with orthopedics 8/24 Plan: Continue diet We will attempt aspirin for DVT prophylaxis With heparin in the setting of postoperative anemia Therapy as tolerated Multimodal pain control TOC consult for SNF placement  AKI Noted postoperatively Likely prerenal azotemia Improving Plan: DC IVF Encouraged p.o. fluid intake Daily renal function  Acute postoperative blood loss anemia Remains hemodynamically stable, good blood pressure on room air Hemoglobin 6.0 as of  8/26 Plan: Transfuse 1 unit PRBC Posttransfusion H&H Transfuse to goal hemoglobin 8  Leukocytosis Suspect stress response No evidence of infection No indication for antibiotics Monitor vitals and fever curve  History of atrial fibrillation Suspect paroxysmal Current sinus rhythm No anticoagulation, presumably due to fall risk and advanced age DC telemetry Continue home metoprolol  History of congestive heart failure with preserved ejection fraction No evidence of exacerbation  Essential hypertension Blood pressure controlled Resume home regimen  GERD PPI  History of recurrent falls Continue therapy as tolerated  DVT prophylaxis: SCD Code Status: DNR, confirmed with patient and son at bedside Family Communication: Son at bedside 8/24, 8/25, 8/26 Disposition Plan: Status is: Inpatient Remains inpatient appropriate because: Hip fracture.  Postoperative day # 2.  PT OT.  Pain control.  Will need skilled nursing facility.   Level of care: Med-Surg  Consultants:  Orthopedics  Procedures:  Hip fracture repair 8/24  Antimicrobials: None   Subjective: Seen and examined.  Sitting up in chair.  Pain moderate.  Objective: Vitals:   08/28/21 2258 08/29/21 0853 08/29/21 0920 08/29/21 0943  BP: (!) 153/61 (!) 136/56 (!) 142/61 (!) 119/46  Pulse: (!) 108 72 74 76  Resp: 16   17  Temp: 98.9 F (37.2 C) 98 F (36.7 C) 98.1 F (36.7 C) 98.1 F (36.7 C)  TempSrc:   Oral Oral  SpO2: 90% 96% 95% 97%  Weight:      Height:        Intake/Output Summary (Last 24 hours) at 08/29/2021 1052 Last data filed at 08/29/2021 1033 Gross per 24 hour  Intake 1240 ml  Output 675 ml  Net 565 ml   Filed Weights   08/26/21 2019 08/27/21  0005 08/27/21 1221  Weight: 44 kg 46.9 kg 46.9 kg    Examination:  General exam: NAD Respiratory system: Lungs clear.  No work of breathing.  Room air Cardiovascular system: S1-2, RRR, no murmurs, trace pedal edema  bilaterally Gastrointestinal system: Soft, NT/ND, normal bowel sounds Central nervous system: Alert, oriented x2, no focal deficits Extremities: Right hip surgical dressings, not removed, dressing CDI, decreased ROM Skin: No rashes, lesions or ulcers Psychiatry: Judgement and insight appear normal. Mood & affect appropriate.     Data Reviewed: I have personally reviewed following labs and imaging studies  CBC: Recent Labs  Lab 08/26/21 2022 08/27/21 0012 08/27/21 0208 08/28/21 0321 08/29/21 0516  WBC 19.0* 17.1* 14.6* 11.9* 11.4*  NEUTROABS 14.5* 14.7*  --   --   --   HGB 12.2 11.4* 10.4* 8.1* 6.0*  HCT 37.4 34.7* 31.2* 24.7* 18.3*  MCV 96.1 95.3 95.1 96.5 95.3  PLT 260 239 228 219 159   Basic Metabolic Panel: Recent Labs  Lab 08/26/21 2022 08/27/21 0208 08/28/21 0321 08/29/21 0516  NA 137 135 136 134*  K 3.8 4.4 4.4 4.1  CL 103 104 105 106  CO2 25 26 25 22   GLUCOSE 181* 192* 164* 144*  BUN 27* 27* 40* 37*  CREATININE 1.14* 1.00 1.71* 1.24*  CALCIUM 8.7* 8.5* 8.6* 7.9*   GFR: Estimated Creatinine Clearance: 19.2 mL/min (A) (by C-G formula based on SCr of 1.24 mg/dL (H)). Liver Function Tests: Recent Labs  Lab 08/27/21 0012  ALBUMIN 3.7   No results for input(s): "LIPASE", "AMYLASE" in the last 168 hours. No results for input(s): "AMMONIA" in the last 168 hours. Coagulation Profile: No results for input(s): "INR", "PROTIME" in the last 168 hours. Cardiac Enzymes: No results for input(s): "CKTOTAL", "CKMB", "CKMBINDEX", "TROPONINI" in the last 168 hours. BNP (last 3 results) No results for input(s): "PROBNP" in the last 8760 hours. HbA1C: No results for input(s): "HGBA1C" in the last 72 hours. CBG: No results for input(s): "GLUCAP" in the last 168 hours. Lipid Profile: No results for input(s): "CHOL", "HDL", "LDLCALC", "TRIG", "CHOLHDL", "LDLDIRECT" in the last 72 hours. Thyroid Function Tests: No results for input(s): "TSH", "T4TOTAL", "FREET4",  "T3FREE", "THYROIDAB" in the last 72 hours. Anemia Panel: No results for input(s): "VITAMINB12", "FOLATE", "FERRITIN", "TIBC", "IRON", "RETICCTPCT" in the last 72 hours. Sepsis Labs: Recent Labs  Lab 08/27/21 0012 08/27/21 0208  PROCALCITON <0.10  --   LATICACIDVEN 1.6 1.0    No results found for this or any previous visit (from the past 240 hour(s)).       Radiology Studies: DG HIP UNILAT WITH PELVIS 2-3 VIEWS RIGHT  Result Date: 08/27/2021 CLINICAL DATA:  Intramedullary rod fixation of right femur. EXAM: DG HIP (WITH OR WITHOUT PELVIS) 2-3V RIGHT; DG C-ARM 1-60 MIN-NO REPORT Radiation exposure index: 23.37 mGy. COMPARISON:  Radiographs of same day. FINDINGS: Five intraoperative fluoroscopic images were obtained of the right femur. These demonstrate intramedullary rod fixation of proximal right femoral fracture. IMPRESSION: Fluoroscopic guidance provided during intramedullary rod fixation of proximal right femoral fracture. Electronically Signed   By: 08/29/2021 M.D.   On: 08/27/2021 15:46   DG C-Arm 1-60 Min-No Report  Result Date: 08/27/2021 Fluoroscopy was utilized by the requesting physician.  No radiographic interpretation.   DG C-Arm 1-60 Min-No Report  Result Date: 08/27/2021 Fluoroscopy was utilized by the requesting physician.  No radiographic interpretation.        Scheduled Meds:  docusate sodium  100 mg Oral BID  hydrALAZINE  10 mg Oral TID   metoprolol tartrate  12.5 mg Oral BID   mirtazapine  7.5 mg Oral QHS   pantoprazole  40 mg Oral Daily   senna  8.6 mg Oral Daily   Continuous Infusions:     LOS: 3 days     Tresa Moore, MD [facility]   If 7PM-7AM, please contact night-coverage  08/29/2021, 10:52 AM

## 2021-08-29 NOTE — Progress Notes (Signed)
Subjective: 2 Days Post-Op Procedure(s) (LRB): INTRAMEDULLARY (IM) NAIL FEMORAL (Right)   Patient is up in a chair and receiving 1 unit of packed cells transfusion.  Hemoglobin was 6.0 this morning.  She has been on heparin and apparently bleeding.  I discussed this with Dr. Georgeann Oppenheim and we will try to switch her to aspirin if possible. Dressings are dry.  Patient reports pain as mild.  Objective:   VITALS:   Vitals:   08/29/21 0920 08/29/21 0943  BP: (!) 142/61 (!) 119/46  Pulse: 74 76  Resp:  17  Temp: 98.1 F (36.7 C) 98.1 F (36.7 C)  SpO2: 95% 97%    Neurovascular intact Incision: scant drainage  LABS Recent Labs    08/27/21 0208 08/28/21 0321 08/29/21 0516  HGB 10.4* 8.1* 6.0*  HCT 31.2* 24.7* 18.3*  WBC 14.6* 11.9* 11.4*  PLT 228 219 159    Recent Labs    08/27/21 0208 08/28/21 0321 08/29/21 0516  NA 135 136 134*  K 4.4 4.4 4.1  BUN 27* 40* 37*  CREATININE 1.00 1.71* 1.24*  GLUCOSE 192* 164* 144*    No results for input(s): "LABPT", "INR" in the last 72 hours.   Assessment/Plan: 2 Days Post-Op Procedure(s) (LRB): INTRAMEDULLARY (IM) NAIL FEMORAL (Right)   Up with therapy Discharge to SNF

## 2021-08-29 NOTE — TOC Initial Note (Signed)
Transition of Care [facility]) - Initial/Assessment Note    Patient Details  Name: Vanessa Brock MRN: [id number] Date of Birth: 11-19-1922  Transition of Care [facility] CM/SW Contact:    Elliot Gurney [place], [place] Phone Number: 08/29/2021, 1:40 PM  Clinical Narrative:                 This Education officer, museum met with patient, her son and daughter in law to discuss recommendation for SNF. Patient asleep during visit. Patient's son agreeable to SNF recommendation. He would like patient to return to [facility] and has called to check status on a bed there. Patient's son open to sending Fl2 to additional local facilities as well. Patient resides at [facility] and will return there post discharge from the SNF. Patient's medical and pharmacy needs are met at the facility. Fl2 faxed out to [facility] as well as other local facilities, will discuss bed offers with family once received.  Transition of Care to continue to follow  [facility], LCSW Transition of Care [phone]   Expected Discharge Plan: [place] Barriers to Discharge: Continued Medical Work up   Patient Goals and CMS Choice        Expected Discharge Plan and Services Expected Discharge Plan: [place] In-house Referral: Clinical Social Work   Post Acute Care Choice: [place] Living arrangements for the past 2 months: [place] Location manager)                                      Prior Living Arrangements/Services Living arrangements for the past 2 months: [place] Location manager) Lives with:: Facility Resident   Do you feel safe going back to the place where you live?: Yes        Care giver support system in place?: Yes (comment)   Criminal Activity/Legal Involvement Pertinent to Current Situation/Hospitalization: No - Comment as needed  Activities of Daily Living Home Assistive Devices/Equipment: Cane (specify quad or  straight), Walker (specify type), Shower chair with back ADL Screening (condition at time of admission) Patient's cognitive ability adequate to safely complete daily activities?: Yes Is the patient deaf or have difficulty hearing?: Yes Does the patient have difficulty seeing, even when wearing glasses/contacts?: No Does the patient have difficulty concentrating, remembering, or making decisions?: No Patient able to express need for assistance with ADLs?: Yes Does the patient have difficulty dressing or bathing?: No Independently performs ADLs?: Yes (appropriate for developmental age) Does the patient have difficulty walking or climbing stairs?: No Weakness of Legs: Both (pt feels legs give out sometimes) Weakness of Arms/Hands: None  Permission Sought/Granted                  Emotional Assessment Appearance:: Appears stated age Attitude/Demeanor/Rapport: Unable to Assess Affect (typically observed): Unable to Assess   Alcohol / Substance Use: Not Applicable Psych Involvement: No (comment)  Admission diagnosis:  Closed left hip fracture ([place]) [S72.002A] Closed fracture of right hip, initial encounter [facility]) [S72.001A] Patient Active Problem List   Diagnosis Date Noted   Malnutrition of moderate degree 08/27/2021   Closed left hip fracture (HCC) 08/26/2021   Nausea vomiting and diarrhea 07/20/2021   Hypertension    Atrial fibrillation, chronic (HCC)    GERD (gastroesophageal reflux disease)    Depression    Acute renal failure superimposed on stage 3a chronic kidney disease ([place])  Myocardial injury    Hypokalemia    Chronic diastolic CHF (congestive heart failure) ([place])    Macrocytic anemia 07/02/2020   Fall 06/25/2020   CKD (chronic kidney disease) 06/25/2020   Leukocytosis 06/25/2020   Paroxysmal atrial fibrillation ([place]) 07/21/2018   Hip fracture ([place]) 02/05/2018   PCP:  [facility] Pharmacy:   [id number] [place] [id number] [id number]: [phone]: [address] [address] [phone]. [place]Phone: [phone]: [phone]    Social Determinants of Health (SDOH) Interventions    Readmission Risk Interventions     No data to display

## 2021-08-29 NOTE — Plan of Care (Signed)
Problem: Education: Goal: Knowledge of General Education information will improve Description: Including pain rating scale, medication(s)/side effects and non-pharmacologic comfort measures 08/29/2021 1638 by Greer Ee, RN Outcome: Progressing 08/29/2021 1457 by Greer Ee, RN Outcome: Progressing   Problem: Health Behavior/Discharge Planning: Goal: Ability to manage health-related needs will improve 08/29/2021 1638 by Greer Ee, RN Outcome: Progressing 08/29/2021 1457 by Greer Ee, RN Outcome: Progressing   Problem: Clinical Measurements: Goal: Ability to maintain clinical measurements within normal limits will improve 08/29/2021 1638 by Greer Ee, RN Outcome: Progressing 08/29/2021 1457 by Greer Ee, RN Outcome: Progressing Goal: Will remain free from infection 08/29/2021 1638 by Greer Ee, RN Outcome: Progressing 08/29/2021 1457 by Greer Ee, RN Outcome: Progressing Goal: Diagnostic test results will improve 08/29/2021 1638 by Greer Ee, RN Outcome: Progressing 08/29/2021 1457 by Greer Ee, RN Outcome: Progressing Goal: Respiratory complications will improve 08/29/2021 1638 by Greer Ee, RN Outcome: Progressing 08/29/2021 1457 by Greer Ee, RN Outcome: Progressing Goal: Cardiovascular complication will be avoided 08/29/2021 1638 by Greer Ee, RN Outcome: Progressing 08/29/2021 1457 by Greer Ee, RN Outcome: Progressing   Problem: Activity: Goal: Risk for activity intolerance will decrease 08/29/2021 1638 by Greer Ee, RN Outcome: Progressing 08/29/2021 1457 by Greer Ee, RN Outcome: Progressing   Problem: Nutrition: Goal: Adequate nutrition will be maintained 08/29/2021 1638 by Greer Ee, RN Outcome: Progressing 08/29/2021 1457 by Greer Ee, RN Outcome: Progressing   Problem: Coping: Goal: Level of anxiety will  decrease 08/29/2021 1638 by Greer Ee, RN Outcome: Progressing 08/29/2021 1457 by Greer Ee, RN Outcome: Progressing   Problem: Elimination: Goal: Will not experience complications related to bowel motility 08/29/2021 1638 by Greer Ee, RN Outcome: Progressing 08/29/2021 1457 by Greer Ee, RN Outcome: Progressing Goal: Will not experience complications related to urinary retention 08/29/2021 1638 by Greer Ee, RN Outcome: Progressing 08/29/2021 1457 by Greer Ee, RN Outcome: Progressing   Problem: Pain Managment: Goal: General experience of comfort will improve 08/29/2021 1638 by Greer Ee, RN Outcome: Progressing 08/29/2021 1457 by Greer Ee, RN Outcome: Progressing   Problem: Safety: Goal: Ability to remain free from injury will improve 08/29/2021 1638 by Greer Ee, RN Outcome: Progressing 08/29/2021 1457 by Greer Ee, RN Outcome: Progressing   Problem: Skin Integrity: Goal: Risk for impaired skin integrity will decrease 08/29/2021 1638 by Greer Ee, RN Outcome: Progressing 08/29/2021 1457 by Greer Ee, RN Outcome: Progressing   Problem: Education: Goal: Verbalization of understanding the information provided (i.e., activity precautions, restrictions, etc) will improve 08/29/2021 1638 by Greer Ee, RN Outcome: Progressing 08/29/2021 1457 by Greer Ee, RN Outcome: Progressing Goal: Individualized Educational Video(s) 08/29/2021 1638 by Greer Ee, RN Outcome: Progressing 08/29/2021 1457 by Greer Ee, RN Outcome: Progressing   Problem: Activity: Goal: Ability to ambulate and perform ADLs will improve 08/29/2021 1638 by Greer Ee, RN Outcome: Progressing 08/29/2021 1457 by Greer Ee, RN Outcome: Progressing   Problem: Clinical Measurements: Goal: Postoperative complications will be avoided or minimized 08/29/2021 1638  by Greer Ee, RN Outcome: Progressing 08/29/2021 1457 by Greer Ee, RN Outcome: Progressing   Problem: Self-Concept: Goal: Ability to maintain and perform role responsibilities to the fullest extent possible will improve 08/29/2021 1638 by Greer Ee, RN Outcome: Progressing 08/29/2021 1457 by Greer Ee, RN Outcome: Progressing   Problem: Pain Management: Goal:  Pain level will decrease 08/29/2021 1638 by Greer Ee, RN Outcome: Progressing 08/29/2021 1457 by Greer Ee, RN Outcome: Progressing   Problem: Clinical Measurements: Goal: Postoperative complications will be avoided or minimized 08/29/2021 1638 by Greer Ee, RN Outcome: Progressing 08/29/2021 1457 by Greer Ee, RN Outcome: Progressing   Problem: Pain Management: Goal: Pain level will decrease with appropriate interventions 08/29/2021 1638 by Greer Ee, RN Outcome: Progressing 08/29/2021 1457 by Greer Ee, RN Outcome: Progressing   Problem: Skin Integrity: Goal: Will show signs of wound healing 08/29/2021 1638 by Greer Ee, RN Outcome: Progressing 08/29/2021 1457 by Greer Ee, RN Outcome: Progressing   Problem: Education: Goal: Knowledge of the prescribed therapeutic regimen will improve Outcome: Progressing Goal: Understanding of discharge needs will improve Outcome: Progressing Goal: Individualized Educational Video(s) Outcome: Progressing   Problem: Activity: Goal: Ability to avoid complications of mobility impairment will improve Outcome: Progressing Goal: Ability to tolerate increased activity will improve Outcome: Progressing   Problem: Clinical Measurements: Goal: Postoperative complications will be avoided or minimized Outcome: Progressing   Problem: Pain Management: Goal: Pain level will decrease with appropriate interventions Outcome: Progressing   Problem: Skin Integrity: Goal: Will show  signs of wound healing Outcome: Progressing

## 2021-08-30 DIAGNOSIS — S72002D Fracture of unspecified part of neck of left femur, subsequent encounter for closed fracture with routine healing: Secondary | ICD-10-CM | POA: Diagnosis not present

## 2021-08-30 LAB — CBC WITH DIFFERENTIAL/PLATELET
Abs Immature Granulocytes: 0.09 10*3/uL — ABNORMAL HIGH (ref 0.00–0.07)
Basophils Absolute: 0.1 10*3/uL (ref 0.0–0.1)
Basophils Relative: 1 %
Eosinophils Absolute: 0 10*3/uL (ref 0.0–0.5)
Eosinophils Relative: 0 %
HCT: 23.2 % — ABNORMAL LOW (ref 36.0–46.0)
Hemoglobin: 7.6 g/dL — ABNORMAL LOW (ref 12.0–15.0)
Immature Granulocytes: 1 %
Lymphocytes Relative: 13 %
Lymphs Abs: 1.5 10*3/uL (ref 0.7–4.0)
MCH: 29.6 pg (ref 26.0–34.0)
MCHC: 32.8 g/dL (ref 30.0–36.0)
MCV: 90.3 fL (ref 80.0–100.0)
Monocytes Absolute: 1.2 10*3/uL — ABNORMAL HIGH (ref 0.1–1.0)
Monocytes Relative: 11 %
Neutro Abs: 8.1 10*3/uL — ABNORMAL HIGH (ref 1.7–7.7)
Neutrophils Relative %: 74 %
Platelets: 176 10*3/uL (ref 150–400)
RBC: 2.57 MIL/uL — ABNORMAL LOW (ref 3.87–5.11)
RDW: 15.9 % — ABNORMAL HIGH (ref 11.5–15.5)
WBC: 10.9 10*3/uL — ABNORMAL HIGH (ref 4.0–10.5)
nRBC: 0 % (ref 0.0–0.2)

## 2021-08-30 LAB — BASIC METABOLIC PANEL
Anion gap: 4 — ABNORMAL LOW (ref 5–15)
BUN: 25 mg/dL — ABNORMAL HIGH (ref 8–23)
CO2: 24 mmol/L (ref 22–32)
Calcium: 8.3 mg/dL — ABNORMAL LOW (ref 8.9–10.3)
Chloride: 107 mmol/L (ref 98–111)
Creatinine, Ser: 0.86 mg/dL (ref 0.44–1.00)
GFR, Estimated: >60 mL/min (ref >60)
Glucose, Bld: 116 mg/dL — ABNORMAL HIGH (ref 70–99)
Potassium: 4 mmol/L (ref 3.5–5.1)
Sodium: 135 mmol/L (ref 135–145)

## 2021-08-30 LAB — PREPARE RBC (CROSSMATCH)

## 2021-08-30 LAB — HEMOGLOBIN AND HEMATOCRIT, BLOOD
HCT: 29 % — ABNORMAL LOW (ref 36.0–46.0)
Hemoglobin: 9.4 g/dL — ABNORMAL LOW (ref 12.0–15.0)

## 2021-08-30 MED ORDER — SODIUM CHLORIDE 0.9% IV SOLUTION: Freq: Once | INTRAVENOUS | Status: AC

## 2021-08-30 NOTE — Progress Notes (Signed)
PROGRESS NOTE    Vanessa Brock  NFA:[id number] DOB: [birth year] DOA: 08/26/2021 PCP: [facility]    Brief Narrative:  88 y.o. female with medical history significant of  HTN, GERD, impaired fasting glucose, Afib not on anticoagulation, CHFpef, depression acute comminuted displaced and angulated proximal femoral fracture involves the lower trochanteric region and subtrochanteric Femur s/p repair which was complicated by new onset afib. Patient also has hx of admission on for gastro enteritis due to norovirus. Patient now  presents to ED s/p mechanical fall with associated complaint of injury to left leg with pain and noted deformity of leg. Patient on ros denies abdominal pain/ dysuria/ fever chills,sob/ chest pain n/v does note mild constipation. Notes pain in right leg.  Seen in consultation by orthopedic surgery.  Status post operative fixation 8/24.  No immediate postoperative complications however patient's hemoglobin has dropped to 6.0 on 8/26.  Had some AKI postoperatively that has not recovered.  Transfusing 1 unit PRBC  8/27: Hemoglobin 7.6 this AM.  Transfusing additional 1 unit PRBC with goal hemoglobin of 8   Assessment & Plan:   Principal Problem:   Closed left hip fracture (HCC) Active Problems:   Chronic diastolic CHF (congestive heart failure) (HCC)   Paroxysmal atrial fibrillation (HCC)   Fall   Leukocytosis   Malnutrition of moderate degree  Right hip fracture Status post mechanical fall No evidence of syncopal event Pain mild to moderate Status post the OR with orthopedics 8/24 Plan: Continue diet Aspirin 81 mg p.o. twice daily for DVT prophylaxis Therapy as tolerated Multimodal pain control TOC consult for SNF placement Anticipate medical readiness for discharge 8/28  AKI Noted postoperatively Likely prerenal azotemia Improving Plan: Encouraged p.o. fluid intake Daily renal function  Acute postoperative blood loss anemia Remains  hemodynamically stable, good blood pressure on room air Hemoglobin 6.0 as of 8/26 Transfuse 1 unit on 8/26.  Hemoglobin initially responded to 8.5 Subsequently dropped to 7.6 on 8/27 Remained hemodynamically stable Plan: Transfuse general 1 unit PRBC Posttransfusion H&H Transfuse to goal hemoglobin 8  Leukocytosis Suspect stress response No evidence of infection No indication for antibiotics Monitor vitals and fever curve  History of atrial fibrillation Suspect paroxysmal Current sinus rhythm No anticoagulation, presumably due to fall risk and advanced age DC telemetry Continue home metoprolol  History of congestive heart failure with preserved ejection fraction No evidence of exacerbation  Essential hypertension Blood pressure controlled Resume home regimen  GERD PPI  History of recurrent falls Continue therapy as tolerated  DVT prophylaxis: SCD Code Status: DNR, confirmed with patient and son at bedside Family Communication: Son at bedside 8/24, 8/25, 8/26 Disposition Plan: Status is: Inpatient Remains inpatient appropriate because: Hip fracture.  Postoperative day # 3.  PT OT.  Pain control.  Will need skilled nursing facility.  Anticipate medical readiness for discharge 8/28   Level of care: Med-Surg  Consultants:  Orthopedics  Procedures:  Hip fracture repair 8/24  Antimicrobials: None   Subjective: Seen and examined.  Sitting up in chair.  Pain mild.  Objective: Vitals:   08/30/21 0538 08/30/21 0720 08/30/21 0851 08/30/21 0912  BP: (!) 115/56 (!) 160/89 (!) 137/52 130/62  Pulse: 97 (!) 104 (!) 108 (!) 109  Resp: 20 16 17 18   Temp: 97.8 F (36.6 C) 97.9 F (36.6 C) 98.3 F (36.8 C) 97.8 F (36.6 C)  TempSrc:    Oral  SpO2: 97% 98% 98% 97%  Weight:      Height:  Intake/Output Summary (Last 24 hours) at 08/30/2021 1054 Last data filed at 08/30/2021 1024 Gross per 24 hour  Intake 1181.5 ml  Output 1000 ml  Net 181.5 ml   Filed  Weights   08/26/21 2019 08/27/21 0005 08/27/21 1221  Weight: 44 kg 46.9 kg 46.9 kg    Examination:  General exam: No acute distress Respiratory system: Lungs clear.  No work of breathing.  Room air Cardiovascular system: S1-2, RRR, no murmurs, trace pedal edema bilaterally Gastrointestinal system: Soft, NT/ND, normal bowel sounds Central nervous system: Alert, oriented x2, no focal deficits Extremities: Right hip surgical dressings, not removed, dressing CDI, decreased ROM Skin: No rashes, lesions or ulcers Psychiatry: Judgement and insight appear normal. Mood & affect appropriate.     Data Reviewed: I have personally reviewed following labs and imaging studies  CBC: Recent Labs  Lab 08/26/21 2022 08/27/21 0012 08/27/21 0208 08/28/21 0321 08/29/21 0516 08/29/21 1304 08/30/21 0537  WBC 19.0* 17.1* 14.6* 11.9* 11.4*  --  10.9*  NEUTROABS 14.5* 14.7*  --   --   --   --  8.1*  HGB 12.2 11.4* 10.4* 8.1* 6.0* 8.5* 7.6*  HCT 37.4 34.7* 31.2* 24.7* 18.3* 25.6* 23.2*  MCV 96.1 95.3 95.1 96.5 95.3  --  90.3  PLT 260 239 228 219 159  --  176   Basic Metabolic Panel: Recent Labs  Lab 08/26/21 2022 08/27/21 0208 08/28/21 0321 08/29/21 0516 08/30/21 0537  NA 137 135 136 134* 135  K 3.8 4.4 4.4 4.1 4.0  CL 103 104 105 106 107  CO2 25 26 25 22 24   GLUCOSE 181* 192* 164* 144* 116*  BUN 27* 27* 40* 37* 25*  CREATININE 1.14* 1.00 1.71* 1.24* 0.86  CALCIUM 8.7* 8.5* 8.6* 7.9* 8.3*   GFR: Estimated Creatinine Clearance: 27.7 mL/min (by C-G formula based on SCr of 0.86 mg/dL). Liver Function Tests: Recent Labs  Lab 08/27/21 0012  ALBUMIN 3.7   No results for input(s): "LIPASE", "AMYLASE" in the last 168 hours. No results for input(s): "AMMONIA" in the last 168 hours. Coagulation Profile: No results for input(s): "INR", "PROTIME" in the last 168 hours. Cardiac Enzymes: No results for input(s): "CKTOTAL", "CKMB", "CKMBINDEX", "TROPONINI" in the last 168 hours. BNP (last 3  results) No results for input(s): "PROBNP" in the last 8760 hours. HbA1C: No results for input(s): "HGBA1C" in the last 72 hours. CBG: No results for input(s): "GLUCAP" in the last 168 hours. Lipid Profile: No results for input(s): "CHOL", "HDL", "LDLCALC", "TRIG", "CHOLHDL", "LDLDIRECT" in the last 72 hours. Thyroid Function Tests: No results for input(s): "TSH", "T4TOTAL", "FREET4", "T3FREE", "THYROIDAB" in the last 72 hours. Anemia Panel: No results for input(s): "VITAMINB12", "FOLATE", "FERRITIN", "TIBC", "IRON", "RETICCTPCT" in the last 72 hours. Sepsis Labs: Recent Labs  Lab 08/27/21 0012 08/27/21 0208  PROCALCITON <0.10  --   LATICACIDVEN 1.6 1.0    No results found for this or any previous visit (from the past 240 hour(s)).       Radiology Studies: No results found.      Scheduled Meds:  aspirin EC  81 mg Oral BID   docusate sodium  100 mg Oral BID   hydrALAZINE  10 mg Oral TID   metoprolol tartrate  12.5 mg Oral BID   mirtazapine  7.5 mg Oral QHS   pantoprazole  40 mg Oral Daily   senna  8.6 mg Oral Daily   Continuous Infusions:     LOS: 4 days  Tresa Moore, MD [facility]   If 7PM-7AM, please contact night-coverage  08/30/2021, 10:54 AM

## 2021-08-30 NOTE — Plan of Care (Signed)
  Problem: Education: Goal: Knowledge of General Education information will improve Description: Including pain rating scale, medication(s)/side effects and non-pharmacologic comfort measures Outcome: Progressing   Problem: Health Behavior/Discharge Planning: Goal: Ability to manage health-related needs will improve Outcome: Progressing   Problem: Clinical Measurements: Goal: Ability to maintain clinical measurements within normal limits will improve Outcome: Progressing Goal: Will remain free from infection Outcome: Progressing Goal: Diagnostic test results will improve Outcome: Progressing Goal: Respiratory complications will improve Outcome: Progressing Goal: Cardiovascular complication will be avoided Outcome: Progressing   Problem: Activity: Goal: Risk for activity intolerance will decrease Outcome: Progressing   Problem: Nutrition: Goal: Adequate nutrition will be maintained Outcome: Progressing   Problem: Coping: Goal: Level of anxiety will decrease Outcome: Progressing   Problem: Elimination: Goal: Will not experience complications related to bowel motility Outcome: Progressing Goal: Will not experience complications related to urinary retention Outcome: Progressing   Problem: Pain Managment: Goal: General experience of comfort will improve Outcome: Progressing   Problem: Safety: Goal: Ability to remain free from injury will improve Outcome: Progressing   Problem: Skin Integrity: Goal: Risk for impaired skin integrity will decrease Outcome: Progressing   Problem: Education: Goal: Verbalization of understanding the information provided (i.e., activity precautions, restrictions, etc) will improve Outcome: Progressing Goal: Individualized Educational Video(s) Outcome: Progressing   Problem: Activity: Goal: Ability to ambulate and perform ADLs will improve Outcome: Progressing   Problem: Clinical Measurements: Goal: Postoperative complications will be  avoided or minimized Outcome: Progressing   Problem: Self-Concept: Goal: Ability to maintain and perform role responsibilities to the fullest extent possible will improve Outcome: Progressing   Problem: Pain Management: Goal: Pain level will decrease Outcome: Progressing   Problem: Clinical Measurements: Goal: Postoperative complications will be avoided or minimized Outcome: Progressing   Problem: Pain Management: Goal: Pain level will decrease with appropriate interventions Outcome: Progressing   Problem: Skin Integrity: Goal: Will show signs of wound healing Outcome: Progressing   Problem: Education: Goal: Knowledge of the prescribed therapeutic regimen will improve Outcome: Progressing Goal: Understanding of discharge needs will improve Outcome: Progressing Goal: Individualized Educational Video(s) Outcome: Progressing   Problem: Activity: Goal: Ability to avoid complications of mobility impairment will improve Outcome: Progressing Goal: Ability to tolerate increased activity will improve Outcome: Progressing   Problem: Clinical Measurements: Goal: Postoperative complications will be avoided or minimized Outcome: Progressing   Problem: Pain Management: Goal: Pain level will decrease with appropriate interventions Outcome: Progressing   Problem: Skin Integrity: Goal: Will show signs of wound healing Outcome: Progressing

## 2021-08-30 NOTE — Progress Notes (Signed)
PT Cancellation Note  Patient Details Name: Vanessa Brock MRN: [id number] DOB: March 10, 1922   Cancelled Treatment:    Reason Eval/Treat Not Completed: Fatigue/lethargy limiting ability to participate  Pt  noted to be up in recliner with nursing this am before blood transfusion.  Returned later after complete and pt in bed.  Stated she was fatigued and wanted to defer session for today.  Will return tomorrow as pt has already gotten up to chair this am.   Danielle Dess 08/30/2021, 12:06 PM

## 2021-08-30 NOTE — Progress Notes (Signed)
Subjective: 3 Days Post-Op Procedure(s) (LRB): INTRAMEDULLARY (IM) NAIL FEMORAL (Right) Patient is sitting up in bed alert and awake.  She is getting a unit of blood again.  Pain is mild to moderate she says.  Patient reports pain as mild.  Objective:   VITALS:   Vitals:   08/30/21 0851 08/30/21 0912  BP: (!) 137/52 130/62  Pulse: (!) 108 (!) 109  Resp: 17 18  Temp: 98.3 F (36.8 C) 97.8 F (36.6 C)  SpO2: 98% 97%    Neurologically intact Incision: dressing C/D/I  LABS Recent Labs    08/28/21 0321 08/29/21 0516 08/29/21 1304 08/30/21 0537  HGB 8.1* 6.0* 8.5* 7.6*  HCT 24.7* 18.3* 25.6* 23.2*  WBC 11.9* 11.4*  --  10.9*  PLT 219 159  --  176    Recent Labs    08/28/21 0321 08/29/21 0516 08/30/21 0537  NA 136 134* 135  K 4.4 4.1 4.0  BUN 40* 37* 25*  CREATININE 1.71* 1.24* 0.86  GLUCOSE 164* 144* 116*    No results for input(s): "LABPT", "INR" in the last 72 hours.   Assessment/Plan: 3 Days Post-Op Procedure(s) (LRB): INTRAMEDULLARY (IM) NAIL FEMORAL (Right)   Up with therapy Discharge to SNF

## 2021-08-30 NOTE — Plan of Care (Signed)
Patient sleeping between care. Aox4. Reports moderate pain overnight. Dressing C/D/I. No new changes in assessment. Call bell within reach. Bed alarm on.   Problem: Education: Goal: Knowledge of General Education information will improve Description: Including pain rating scale, medication(s)/side effects and non-pharmacologic comfort measures 08/30/2021 0448 by Harlie Buening, Laurena Slimmer, RN Outcome: Progressing 08/30/2021 0447 by Abdullah Rizzi, Laurena Slimmer, RN Outcome: Progressing   Problem: Health Behavior/Discharge Planning: Goal: Ability to manage health-related needs will improve 08/30/2021 0448 by Oyinkansola Truax, Laurena Slimmer, RN Outcome: Progressing 08/30/2021 0447 by Meika Earll, Laurena Slimmer, RN Outcome: Progressing   Problem: Clinical Measurements: Goal: Ability to maintain clinical measurements within normal limits will improve 08/30/2021 0448 by Niv Darley, Laurena Slimmer, RN Outcome: Progressing 08/30/2021 0447 by Lavinia Mcneely, Laurena Slimmer, RN Outcome: Progressing Goal: Will remain free from infection 08/30/2021 0448 by Sanja Elizardo, Laurena Slimmer, RN Outcome: Progressing 08/30/2021 0447 by Turquoise Esch, Laurena Slimmer, RN Outcome: Progressing Goal: Diagnostic test results will improve 08/30/2021 0448 by Mykael Trott, Laurena Slimmer, RN Outcome: Progressing 08/30/2021 0447 by Ha Placeres, Laurena Slimmer, RN Outcome: Progressing Goal: Respiratory complications will improve 08/30/2021 0448 by Abrahan Fulmore, Laurena Slimmer, RN Outcome: Progressing 08/30/2021 0447 by Azelyn Batie, Laurena Slimmer, RN Outcome: Progressing Goal: Cardiovascular complication will be avoided 08/30/2021 0448 by Astha Probasco, Laurena Slimmer, RN Outcome: Progressing 08/30/2021 0447 by Jakaiya Netherland, Laurena Slimmer, RN Outcome: Progressing   Problem: Activity: Goal: Risk for activity intolerance will decrease 08/30/2021 0448 by Elverna Caffee, Laurena Slimmer, RN Outcome: Progressing 08/30/2021 0447 by Dontreal Miera, Laurena Slimmer, RN Outcome: Progressing   Problem: Nutrition: Goal: Adequate nutrition will be maintained 08/30/2021 0448 by  Azora Bonzo, Laurena Slimmer, RN Outcome: Progressing 08/30/2021 0447 by Natally Ribera, Laurena Slimmer, RN Outcome: Progressing   Problem: Coping: Goal: Level of anxiety will decrease 08/30/2021 0448 by Jazlin Tapscott, Laurena Slimmer, RN Outcome: Progressing 08/30/2021 0447 by Maxwel Meadowcroft, Laurena Slimmer, RN Outcome: Progressing   Problem: Elimination: Goal: Will not experience complications related to bowel motility 08/30/2021 0448 by Teandra Harlan, Laurena Slimmer, RN Outcome: Progressing 08/30/2021 0447 by Mellissa Conley, Laurena Slimmer, RN Outcome: Progressing Goal: Will not experience complications related to urinary retention 08/30/2021 0448 by Sayra Frisby, Laurena Slimmer, RN Outcome: Progressing 08/30/2021 0447 by Avigdor Dollar, Laurena Slimmer, RN Outcome: Progressing   Problem: Pain Managment: Goal: General experience of comfort will improve 08/30/2021 0448 by Kourtnei Rauber, Laurena Slimmer, RN Outcome: Progressing 08/30/2021 0447 by Traci Plemons, Laurena Slimmer, RN Outcome: Progressing   Problem: Safety: Goal: Ability to remain free from injury will improve 08/30/2021 0448 by Christ Fullenwider, Laurena Slimmer, RN Outcome: Progressing 08/30/2021 0447 by Clariece Roesler, Laurena Slimmer, RN Outcome: Progressing   Problem: Skin Integrity: Goal: Risk for impaired skin integrity will decrease 08/30/2021 0448 by Armondo Cech, Laurena Slimmer, RN Outcome: Progressing 08/30/2021 0447 by Vondra Aldredge, Laurena Slimmer, RN Outcome: Progressing   Problem: Education: Goal: Verbalization of understanding the information provided (i.e., activity precautions, restrictions, etc) will improve 08/30/2021 0448 by Maricela Schreur, Laurena Slimmer, RN Outcome: Progressing 08/30/2021 0447 by Nylen Creque, Laurena Slimmer, RN Outcome: Progressing Goal: Individualized Educational Video(s) 08/30/2021 0448 by Nguyen Todorov, Laurena Slimmer, RN Outcome: Progressing 08/30/2021 0447 by Jahaira Earnhart, Laurena Slimmer, RN Outcome: Progressing   Problem: Activity: Goal: Ability to ambulate and perform ADLs will improve 08/30/2021 0448 by Leonora Gores, Laurena Slimmer, RN Outcome: Progressing 08/30/2021 0447 by  Teshaun Olarte, Laurena Slimmer, RN Outcome: Progressing   Problem: Clinical Measurements: Goal: Postoperative complications will be avoided or minimized 08/30/2021 0448 by Arrayah Connors, Laurena Slimmer, RN Outcome: Progressing 08/30/2021 0447 by Emmalynne Courtney, Laurena Slimmer, RN Outcome: Progressing   Problem: Self-Concept: Goal: Ability to maintain and perform role responsibilities to the fullest extent possible will  improve 08/30/2021 0448 by Jyl Chico, Laurena Slimmer, RN Outcome: Progressing 08/30/2021 0447 by Flo Berroa, Laurena Slimmer, RN Outcome: Progressing   Problem: Pain Management: Goal: Pain level will decrease 08/30/2021 0448 by Parker Wherley, Laurena Slimmer, RN Outcome: Progressing 08/30/2021 0447 by Nysha Koplin, Laurena Slimmer, RN Outcome: Progressing   Problem: Clinical Measurements: Goal: Postoperative complications will be avoided or minimized 08/30/2021 0448 by Isaiyah Feldhaus, Laurena Slimmer, RN Outcome: Progressing 08/30/2021 0447 by Zianne Schubring, Laurena Slimmer, RN Outcome: Progressing   Problem: Pain Management: Goal: Pain level will decrease with appropriate interventions 08/30/2021 0448 by Evette Diclemente, Laurena Slimmer, RN Outcome: Progressing 08/30/2021 0447 by Presley Gora, Laurena Slimmer, RN Outcome: Progressing   Problem: Skin Integrity: Goal: Will show signs of wound healing 08/30/2021 0448 by Khiem Gargis, Laurena Slimmer, RN Outcome: Progressing 08/30/2021 0447 by Kimala Horne, Laurena Slimmer, RN Outcome: Progressing   Problem: Education: Goal: Knowledge of the prescribed therapeutic regimen will improve 08/30/2021 0448 by Sherlyne Crownover, Laurena Slimmer, RN Outcome: Progressing 08/30/2021 0447 by Shuntay Everetts, Laurena Slimmer, RN Outcome: Progressing Goal: Understanding of discharge needs will improve 08/30/2021 0448 by Ryota Treece, Laurena Slimmer, RN Outcome: Progressing 08/30/2021 0447 by Cleofas Hudgins, Laurena Slimmer, RN Outcome: Progressing Goal: Individualized Educational Video(s) 08/30/2021 0448 by Atisha Hamidi, Laurena Slimmer, RN Outcome: Progressing 08/30/2021 0447 by Gerrett Loman, Laurena Slimmer, RN Outcome: Progressing    Problem: Activity: Goal: Ability to avoid complications of mobility impairment will improve 08/30/2021 0448 by Lisandro Meggett, Laurena Slimmer, RN Outcome: Progressing 08/30/2021 0447 by Alayia Meggison, Laurena Slimmer, RN Outcome: Progressing Goal: Ability to tolerate increased activity will improve 08/30/2021 0448 by Justun Anaya, Laurena Slimmer, RN Outcome: Progressing 08/30/2021 0447 by Holli Rengel, Laurena Slimmer, RN Outcome: Progressing   Problem: Clinical Measurements: Goal: Postoperative complications will be avoided or minimized 08/30/2021 0448 by Philisha Weinel, Laurena Slimmer, RN Outcome: Progressing 08/30/2021 0447 by Neilah Fulwider, Laurena Slimmer, RN Outcome: Progressing   Problem: Pain Management: Goal: Pain level will decrease with appropriate interventions 08/30/2021 0448 by Sophiah Rolin, Laurena Slimmer, RN Outcome: Progressing 08/30/2021 0447 by Cearra Portnoy, Laurena Slimmer, RN Outcome: Progressing   Problem: Skin Integrity: Goal: Will show signs of wound healing 08/30/2021 0448 by Jaycelynn Knickerbocker, Laurena Slimmer, RN Outcome: Progressing 08/30/2021 0447 by Jalayne Ganesh, Laurena Slimmer, RN Outcome: Progressing

## 2021-08-31 DIAGNOSIS — S72002D Fracture of unspecified part of neck of left femur, subsequent encounter for closed fracture with routine healing: Secondary | ICD-10-CM | POA: Diagnosis not present

## 2021-08-31 LAB — CBC WITH DIFFERENTIAL/PLATELET
Abs Immature Granulocytes: 0.09 10*3/uL — ABNORMAL HIGH (ref 0.00–0.07)
Basophils Absolute: 0.1 10*3/uL (ref 0.0–0.1)
Basophils Relative: 1 %
Eosinophils Absolute: 0.1 10*3/uL (ref 0.0–0.5)
Eosinophils Relative: 1 %
HCT: 32.3 % — ABNORMAL LOW (ref 36.0–46.0)
Hemoglobin: 10.7 g/dL — ABNORMAL LOW (ref 12.0–15.0)
Immature Granulocytes: 1 %
Lymphocytes Relative: 17 %
Lymphs Abs: 2 10*3/uL (ref 0.7–4.0)
MCH: 29 pg (ref 26.0–34.0)
MCHC: 33.1 g/dL (ref 30.0–36.0)
MCV: 87.5 fL (ref 80.0–100.0)
Monocytes Absolute: 1.3 10*3/uL — ABNORMAL HIGH (ref 0.1–1.0)
Monocytes Relative: 11 %
Neutro Abs: 8.5 10*3/uL — ABNORMAL HIGH (ref 1.7–7.7)
Neutrophils Relative %: 69 %
Platelets: 251 10*3/uL (ref 150–400)
RBC: 3.69 MIL/uL — ABNORMAL LOW (ref 3.87–5.11)
RDW: 16.7 % — ABNORMAL HIGH (ref 11.5–15.5)
WBC: 12.1 10*3/uL — ABNORMAL HIGH (ref 4.0–10.5)
nRBC: 0 % (ref 0.0–0.2)

## 2021-08-31 LAB — TYPE AND SCREEN
ABO/RH(D): O POS
Antibody Screen: NEGATIVE
Unit division: 0
Unit division: 0

## 2021-08-31 LAB — BPAM RBC
Blood Product Expiration Date: 202309302359
Blood Product Expiration Date: 202309302359
ISSUE DATE / TIME: 202308260924
ISSUE DATE / TIME: 202308270854
Unit Type and Rh: 5100
Unit Type and Rh: 5100

## 2021-08-31 LAB — BASIC METABOLIC PANEL
Anion gap: 8 (ref 5–15)
BUN: 26 mg/dL — ABNORMAL HIGH (ref 8–23)
CO2: 23 mmol/L (ref 22–32)
Calcium: 8.6 mg/dL — ABNORMAL LOW (ref 8.9–10.3)
Chloride: 106 mmol/L (ref 98–111)
Creatinine, Ser: 0.89 mg/dL (ref 0.44–1.00)
GFR, Estimated: 59 mL/min — ABNORMAL LOW (ref >60)
Glucose, Bld: 120 mg/dL — ABNORMAL HIGH (ref 70–99)
Potassium: 3.8 mmol/L (ref 3.5–5.1)
Sodium: 137 mmol/L (ref 135–145)

## 2021-08-31 MED ORDER — ASPIRIN 81 MG PO TBEC
81.0000 mg | DELAYED_RELEASE_TABLET | Freq: Two times a day (BID) | ORAL | 0 refills | Status: AC
Start: 2021-08-31 — End: 2021-10-11

## 2021-08-31 MED ORDER — ADULT MULTIVITAMIN W/MINERALS CH
1.0000 | ORAL_TABLET | Freq: Every day | ORAL | Status: DC
Start: 2021-09-01 — End: 2021-08-31

## 2021-08-31 MED ORDER — METOPROLOL TARTRATE 25 MG PO TABS: 25.0000 mg | ORAL_TABLET | Freq: Two times a day (BID) | ORAL | Status: DC

## 2021-08-31 MED ORDER — ENSURE ENLIVE PO LIQD
237.0000 mL | Freq: Two times a day (BID) | ORAL | Status: DC
  Administered 2021-08-31: 237 mL via ORAL

## 2021-08-31 MED ORDER — HYDRALAZINE HCL 25 MG PO TABS: 25.0000 mg | ORAL_TABLET | Freq: Three times a day (TID) | ORAL | Status: DC

## 2021-08-31 MED ORDER — HYDRALAZINE HCL 10 MG PO TABS
10.0000 mg | ORAL_TABLET | Freq: Three times a day (TID) | ORAL | Status: DC
  Filled 2021-08-31: qty 1

## 2021-08-31 MED ORDER — METOPROLOL TARTRATE 25 MG PO TABS: 25.0000 mg | ORAL_TABLET | Freq: Two times a day (BID) | ORAL | Status: AC

## 2021-08-31 NOTE — Progress Notes (Signed)
PROGRESS NOTE    Vanessa Brock  WVP:[id number] DOB: 11-02-1922 DOA: 08/26/2021 PCP: [facility]    Brief Narrative:  88 y.o. female with medical history significant of  HTN, GERD, impaired fasting glucose, Afib not on anticoagulation, CHFpef, depression acute comminuted displaced and angulated proximal femoral fracture involves the lower trochanteric region and subtrochanteric Femur s/p repair which was complicated by new onset afib. Patient also has hx of admission on for gastro enteritis due to norovirus. Patient now  presents to ED s/p mechanical fall with associated complaint of injury to left leg with pain and noted deformity of leg. Patient on ros denies abdominal pain/ dysuria/ fever chills,sob/ chest pain n/v does note mild constipation. Notes pain in right leg.  Seen in consultation by orthopedic surgery.  Status post operative fixation 8/24.  No immediate postoperative complications however patient's hemoglobin has dropped to 6.0 on 8/26.  Had some AKI postoperatively that has not recovered.  Transfusing 1 unit PRBC  8/27: Hemoglobin 7.6 this AM.  Transfusing additional 1 unit PRBC with goal hemoglobin of 8  8/28: Hemoglobin stable at 10.7.  No bleeding noted.  Had multiple bowel movements.  Patient medically stable for discharge at this time.  Pending insurance authorization and placement in skilled nursing facility.   Assessment & Plan:   Principal Problem:   Closed left hip fracture (HCC) Active Problems:   Chronic diastolic CHF (congestive heart failure) (HCC)   Paroxysmal atrial fibrillation (HCC)   Fall   Leukocytosis   Malnutrition of moderate degree  Right hip fracture Status post mechanical fall No evidence of syncopal event Pain mild to moderate Status post the OR with orthopedics 8/24 Plan: Continue diet Aspirin 81 mg p.o. twice daily for DVT prophylaxis.  6-week course Therapy as tolerated Multimodal pain control TOC consult for SNF  placement Had bowel movement Now patient medically stable for discharge  AKI Noted postoperatively Likely prerenal azotemia Improving Plan: Encouraged p.o. fluid intake Daily renal function  Acute postoperative blood loss anemia Remains hemodynamically stable, good blood pressure on room air Hemoglobin 6.0 as of 8/26 Transfuse 1 unit on 8/26.  Hemoglobin initially responded to 8.5 Subsequently dropped to 7.6 on 8/27 Remained hemodynamically stable Hemoglobin stable at 10.7 on 8/28 Plan: No further transfusion Medically stable for discharge  Leukocytosis Suspect stress response No evidence of infection No indication for antibiotics Monitor vitals and fever curve  History of atrial fibrillation Suspect paroxysmal Current sinus rhythm No anticoagulation, presumably due to fall risk and advanced age Telemetry discontinued Continue home metoprolol  History of congestive heart failure with preserved ejection fraction No evidence of exacerbation  Essential hypertension Blood pressure controlled Resume home regimen  GERD PPI  History of recurrent falls Continue therapy as tolerated  DVT prophylaxis: SCD Code Status: DNR, confirmed with patient and son at bedside Family Communication: Son at bedside 8/24, 8/25, 8/26.  Daughter at bedside 8/28 Disposition Plan: Status is: Inpatient Remains inpatient appropriate because: Unsafe discharge plan.  Hip fracture.  Postoperative day # 4.  PT OT.  Pain control.  Will need skilled nursing facility.  Medically ready for discharge  Level of care: Med-Surg  Consultants:  Orthopedics  Procedures:  Hip fracture repair 8/24  Antimicrobials: None   Subjective: Seen and examined.  Sitting up in chair.  Pain mild.  Feels better this morning.  Objective: Vitals:   08/30/21 1501 08/30/21 2153 08/31/21 0415 08/31/21 0824  BP: 134/71 (!) 177/85 (!) 145/64 (!) 176/99  Pulse: Marland Kitchen)  105 99 87 68  Resp: 16 20 17 16   Temp: 98.5  F (36.9 C) 98.6 F (37 C) 97.9 F (36.6 C) 97.9 F (36.6 C)  TempSrc:      SpO2: 94% 98% 97% 98%  Weight:      Height:        Intake/Output Summary (Last 24 hours) at 08/31/2021 1014 Last data filed at 08/30/2021 1428 Gross per 24 hour  Intake 550 ml  Output --  Net 550 ml   Filed Weights   08/26/21 2019 08/27/21 0005 08/27/21 1221  Weight: 44 kg 46.9 kg 46.9 kg    Examination:  General exam: In ED Respiratory system: Lungs clear.  No work of breathing.  Room air Cardiovascular system: S1-2, RRR, no murmurs, trace pedal edema bilaterally Gastrointestinal system: Soft, NT/ND, normal bowel sounds Central nervous system: Alert, oriented x2, no focal deficits Extremities: Right hip surgical dressings, not removed, dressing CDI, decreased range of motion  skin: No rashes, lesions or ulcers Psychiatry: Judgement and insight appear normal. Mood & affect appropriate.     Data Reviewed: I have personally reviewed following labs and imaging studies  CBC: Recent Labs  Lab 08/26/21 2022 08/27/21 0012 08/27/21 0208 08/28/21 0321 08/29/21 0516 08/29/21 1304 08/30/21 0537 08/30/21 1332 08/31/21 0839  WBC 19.0* 17.1* 14.6* 11.9* 11.4*  --  10.9*  --  12.1*  NEUTROABS 14.5* 14.7*  --   --   --   --  8.1*  --  8.5*  HGB 12.2 11.4* 10.4* 8.1* 6.0* 8.5* 7.6* 9.4* 10.7*  HCT 37.4 34.7* 31.2* 24.7* 18.3* 25.6* 23.2* 29.0* 32.3*  MCV 96.1 95.3 95.1 96.5 95.3  --  90.3  --  87.5  PLT 260 239 228 219 159  --  176  --  251   Basic Metabolic Panel: Recent Labs  Lab 08/27/21 0208 08/28/21 0321 08/29/21 0516 08/30/21 0537 08/31/21 0839  NA 135 136 134* 135 137  K 4.4 4.4 4.1 4.0 3.8  CL 104 105 106 107 106  CO2 26 25 22 24 23   GLUCOSE 192* 164* 144* 116* 120*  BUN 27* 40* 37* 25* 26*  CREATININE 1.00 1.71* 1.24* 0.86 0.89  CALCIUM 8.5* 8.6* 7.9* 8.3* 8.6*   GFR: Estimated Creatinine Clearance: 26.8 mL/min (by C-G formula based on SCr of 0.89 mg/dL). Liver Function  Tests: Recent Labs  Lab 08/27/21 0012  ALBUMIN 3.7   No results for input(s): "LIPASE", "AMYLASE" in the last 168 hours. No results for input(s): "AMMONIA" in the last 168 hours. Coagulation Profile: No results for input(s): "INR", "PROTIME" in the last 168 hours. Cardiac Enzymes: No results for input(s): "CKTOTAL", "CKMB", "CKMBINDEX", "TROPONINI" in the last 168 hours. BNP (last 3 results) No results for input(s): "PROBNP" in the last 8760 hours. HbA1C: No results for input(s): "HGBA1C" in the last 72 hours. CBG: No results for input(s): "GLUCAP" in the last 168 hours. Lipid Profile: No results for input(s): "CHOL", "HDL", "LDLCALC", "TRIG", "CHOLHDL", "LDLDIRECT" in the last 72 hours. Thyroid Function Tests: No results for input(s): "TSH", "T4TOTAL", "FREET4", "T3FREE", "THYROIDAB" in the last 72 hours. Anemia Panel: No results for input(s): "VITAMINB12", "FOLATE", "FERRITIN", "TIBC", "IRON", "RETICCTPCT" in the last 72 hours. Sepsis Labs: Recent Labs  Lab 08/27/21 0012 08/27/21 0208  PROCALCITON <0.10  --   LATICACIDVEN 1.6 1.0    No results found for this or any previous visit (from the past 240 hour(s)).       Radiology Studies: No results found.  Scheduled Meds:  aspirin EC  81 mg Oral BID   docusate sodium  100 mg Oral BID   feeding supplement  237 mL Oral BID BM   hydrALAZINE  10 mg Oral TID   metoprolol tartrate  25 mg Oral BID   mirtazapine  7.5 mg Oral QHS   [START ON 09/01/2021] multivitamin with minerals  1 tablet Oral Daily   pantoprazole  40 mg Oral Daily   senna  8.6 mg Oral Daily   Continuous Infusions:     LOS: 5 days     Sidney Ace, MD [facility]   If 7PM-7AM, please contact night-coverage  08/31/2021, 10:14 AM

## 2021-08-31 NOTE — TOC Progression Note (Signed)
Transition of Care [facility]) - Progression Note    Patient Details  Name: Vanessa Brock MRN: [id number] Date of Birth: 1922/08/19  Transition [facility]) CM/SW Contact  Marlowe Sax, RN Phone Number: 08/31/2021, 1:47 PM  Clinical Narrative:     The patient will be going to [facility] room 607[facility] to transport,  Family in the room and is aware of DC today  Expected Discharge Plan: Skilled Nursing Facility Barriers to Discharge: Continued Medical Work up  Expected Discharge Plan and Services Expected Discharge Plan: Skilled Nursing Facility In-house Referral: Clinical Social Work   Post Acute Care Choice: Skilled Nursing Facility Living arrangements for the past 2 months: Assisted Living Facility ([place]) Expected Discharge Date: 08/31/21                                     Social Determinants of Health (SDOH) Interventions    Readmission Risk Interventions     No data to display

## 2021-08-31 NOTE — Plan of Care (Signed)
  Problem: Education: Goal: Knowledge of General Education information will improve Description: Including pain rating scale, medication(s)/side effects and non-pharmacologic comfort measures Outcome: Adequate for Discharge   Problem: Health Behavior/Discharge Planning: Goal: Ability to manage health-related needs will improve Outcome: Adequate for Discharge   Problem: Clinical Measurements: Goal: Ability to maintain clinical measurements within normal limits will improve Outcome: Adequate for Discharge Goal: Will remain free from infection Outcome: Adequate for Discharge Goal: Diagnostic test results will improve Outcome: Adequate for Discharge Goal: Respiratory complications will improve Outcome: Adequate for Discharge Goal: Cardiovascular complication will be avoided Outcome: Adequate for Discharge   Problem: Activity: Goal: Risk for activity intolerance will decrease Outcome: Adequate for Discharge   Problem: Nutrition: Goal: Adequate nutrition will be maintained Outcome: Adequate for Discharge   Problem: Coping: Goal: Level of anxiety will decrease Outcome: Adequate for Discharge   Problem: Elimination: Goal: Will not experience complications related to bowel motility Outcome: Adequate for Discharge Goal: Will not experience complications related to urinary retention Outcome: Adequate for Discharge   Problem: Pain Managment: Goal: General experience of comfort will improve Outcome: Adequate for Discharge   Problem: Safety: Goal: Ability to remain free from injury will improve Outcome: Adequate for Discharge   Problem: Skin Integrity: Goal: Risk for impaired skin integrity will decrease Outcome: Adequate for Discharge   Problem: Malnutrition  (NI-5.2) Goal: Food and/or nutrient delivery Description: Individualized approach for food/nutrient provision. Outcome: Adequate for Discharge   Problem: Education: Goal: Verbalization of understanding the information  provided (i.e., activity precautions, restrictions, etc) will improve Outcome: Adequate for Discharge Goal: Individualized Educational Video(s) Outcome: Adequate for Discharge   Problem: Activity: Goal: Ability to ambulate and perform ADLs will improve Outcome: Adequate for Discharge   Problem: Clinical Measurements: Goal: Postoperative complications will be avoided or minimized Outcome: Adequate for Discharge   Problem: Self-Concept: Goal: Ability to maintain and perform role responsibilities to the fullest extent possible will improve Outcome: Adequate for Discharge   Problem: Pain Management: Goal: Pain level will decrease Outcome: Adequate for Discharge   Problem: Clinical Measurements: Goal: Postoperative complications will be avoided or minimized Outcome: Adequate for Discharge   Problem: Pain Management: Goal: Pain level will decrease with appropriate interventions Outcome: Adequate for Discharge   Problem: Skin Integrity: Goal: Will show signs of wound healing Outcome: Adequate for Discharge   Problem: Education: Goal: Knowledge of the prescribed therapeutic regimen will improve Outcome: Adequate for Discharge Goal: Understanding of discharge needs will improve Outcome: Adequate for Discharge Goal: Individualized Educational Video(s) Outcome: Adequate for Discharge   Problem: Activity: Goal: Ability to avoid complications of mobility impairment will improve Outcome: Adequate for Discharge Goal: Ability to tolerate increased activity will improve Outcome: Adequate for Discharge   Problem: Clinical Measurements: Goal: Postoperative complications will be avoided or minimized Outcome: Adequate for Discharge   Problem: Pain Management: Goal: Pain level will decrease with appropriate interventions Outcome: Adequate for Discharge   Problem: Skin Integrity: Goal: Will show signs of wound healing Outcome: Adequate for Discharge

## 2021-08-31 NOTE — Progress Notes (Signed)
Subjective:  Patient reports pain as relatively well controlled.  Patient is sitting in her bedside chair eating lunch.  She is accompanied by her son.  Objective:   VITALS:   Vitals:   08/30/21 1501 08/30/21 2153 08/31/21 0415 08/31/21 0824  BP: 134/71 (!) 177/85 (!) 145/64 (!) 176/99  Pulse: (!) 105 99 87 68  Resp: 16 20 17 16   Temp: 98.5 F (36.9 C) 98.6 F (37 C) 97.9 F (36.6 C) 97.9 F (36.6 C)  TempSrc:      SpO2: 94% 98% 97% 98%  Weight:      Height:        PHYSICAL EXAM:  Right lower extremity: Dressings are clean, dry and intact, there is mild swelling through the thigh, compartments are soft and compressible, motor and sensory intact distally  LABS  Results for orders placed or performed during the hospital encounter of 08/26/21 (from the past 24 hour(s))  Hemoglobin and hematocrit, blood     Status: Abnormal   Collection Time: 08/30/21  1:32 PM  Result Value Ref Range   Hemoglobin 9.4 (L) 12.0 - 15.0 g/dL   HCT 09/01/21 (L) 65.7 - 84.6 %  CBC with Differential/Platelet     Status: Abnormal   Collection Time: 08/31/21  8:39 AM  Result Value Ref Range   WBC 12.1 (H) 4.0 - 10.5 K/uL   RBC 3.69 (L) 3.87 - 5.11 MIL/uL   Hemoglobin 10.7 (L) 12.0 - 15.0 g/dL   HCT 09/02/21 (L) 95.2 - 84.1 %   MCV 87.5 80.0 - 100.0 fL   MCH 29.0 26.0 - 34.0 pg   MCHC 33.1 30.0 - 36.0 g/dL   RDW 32.4 (H) 40.1 - 02.7 %   Platelets 251 150 - 400 K/uL   nRBC 0.0 0.0 - 0.2 %   Neutrophils Relative % 69 %   Neutro Abs 8.5 (H) 1.7 - 7.7 K/uL   Lymphocytes Relative 17 %   Lymphs Abs 2.0 0.7 - 4.0 K/uL   Monocytes Relative 11 %   Monocytes Absolute 1.3 (H) 0.1 - 1.0 K/uL   Eosinophils Relative 1 %   Eosinophils Absolute 0.1 0.0 - 0.5 K/uL   Basophils Relative 1 %   Basophils Absolute 0.1 0.0 - 0.1 K/uL   Immature Granulocytes 1 %   Abs Immature Granulocytes 0.09 (H) 0.00 - 0.07 K/uL  Basic metabolic panel     Status: Abnormal   Collection Time: 08/31/21  8:39 AM  Result Value Ref  Range   Sodium 137 135 - 145 mmol/L   Potassium 3.8 3.5 - 5.1 mmol/L   Chloride 106 98 - 111 mmol/L   CO2 23 22 - 32 mmol/L   Glucose, Bld 120 (H) 70 - 99 mg/dL   BUN 26 (H) 8 - 23 mg/dL   Creatinine, Ser 09/02/21 0.44 - 1.00 mg/dL   Calcium 8.6 (L) 8.9 - 10.3 mg/dL   GFR, Estimated 59 (L) >60 mL/min   Anion gap 8 5 - 15    No results found.  Assessment/Plan: 4 Days Post-Op  Right femur IM nail.  Hematocrit appears stable status post prior transfusion  Principal Problem:   Closed left hip fracture (HCC) Active Problems:   Paroxysmal atrial fibrillation (HCC)   Fall   Leukocytosis   Chronic diastolic CHF (congestive heart failure) (HCC)   Malnutrition of moderate degree  -Appreciate hospitalist team support/management -PT/OT: Weight-bear as tolerated -VTE prophylaxis: Aspirin -Discharge to SNF pending PT -Follow-up in clinic in approximately 10  to 14 days   Vanessa Brock , MD 08/31/2021, 12:46 PM

## 2021-08-31 NOTE — TOC Progression Note (Signed)
Transition of Care [facility]) - Progression Note    Patient Details  Name: Vanessa Brock MRN: [id number] Date of Birth: 18-May-1922  Transition of Care [facility]) CM/SW Contact  Marlowe Sax, RN Phone Number: 08/31/2021, 2:25 PM  Clinical Narrative:    Patient going to [facility] room 607  EMS called, family in the room and aware   Expected Discharge Plan: Skilled Nursing Facility Barriers to Discharge: Continued Medical Work up  Expected Discharge Plan and Services Expected Discharge Plan: Skilled Nursing Facility In-house Referral: Clinical Social Work   Post Acute Care Choice: Skilled Nursing Facility Living arrangements for the past 2 months: Assisted Living Facility [facility]) Expected Discharge Date: 08/31/21                                     Social Determinants of Health (SDOH) Interventions    Readmission Risk Interventions     No data to display

## 2021-08-31 NOTE — Care Management Important Message (Signed)
Important Message  Patient Details  Name: Vanessa Brock MRN: [id number] Date of Birth: 09/30/1922   Medicare Important Message Given:  Yes     Johnell Comings 08/31/2021, 12:20 PM

## 2021-08-31 NOTE — Progress Notes (Signed)
Report called to Natalia at [facility]

## 2021-08-31 NOTE — Plan of Care (Signed)
  Problem: Education: Goal: Knowledge of General Education information will improve Description: Including pain rating scale, medication(s)/side effects and non-pharmacologic comfort measures Outcome: Progressing   Problem: Health Behavior/Discharge Planning: Goal: Ability to manage health-related needs will improve Outcome: Progressing   Problem: Clinical Measurements: Goal: Ability to maintain clinical measurements within normal limits will improve Outcome: Progressing Goal: Will remain free from infection Outcome: Progressing Goal: Diagnostic test results will improve Outcome: Progressing Goal: Respiratory complications will improve Outcome: Progressing Goal: Cardiovascular complication will be avoided Outcome: Progressing   Problem: Activity: Goal: Risk for activity intolerance will decrease Outcome: Progressing   Problem: Nutrition: Goal: Adequate nutrition will be maintained Outcome: Progressing   Problem: Coping: Goal: Level of anxiety will decrease Outcome: Progressing   Problem: Elimination: Goal: Will not experience complications related to bowel motility Outcome: Progressing Goal: Will not experience complications related to urinary retention Outcome: Progressing   Problem: Pain Managment: Goal: General experience of comfort will improve Outcome: Progressing   Problem: Safety: Goal: Ability to remain free from injury will improve Outcome: Progressing   Problem: Skin Integrity: Goal: Risk for impaired skin integrity will decrease Outcome: Progressing   Problem: Education: Goal: Verbalization of understanding the information provided (i.e., activity precautions, restrictions, etc) will improve Outcome: Progressing Goal: Individualized Educational Video(s) Outcome: Progressing   Problem: Activity: Goal: Ability to ambulate and perform ADLs will improve Outcome: Progressing   Problem: Clinical Measurements: Goal: Postoperative complications will be  avoided or minimized Outcome: Progressing   Problem: Self-Concept: Goal: Ability to maintain and perform role responsibilities to the fullest extent possible will improve Outcome: Progressing   Problem: Pain Management: Goal: Pain level will decrease Outcome: Progressing   Problem: Clinical Measurements: Goal: Postoperative complications will be avoided or minimized Outcome: Progressing   Problem: Pain Management: Goal: Pain level will decrease with appropriate interventions Outcome: Progressing   Problem: Skin Integrity: Goal: Will show signs of wound healing Outcome: Progressing   Problem: Education: Goal: Knowledge of the prescribed therapeutic regimen will improve Outcome: Progressing Goal: Understanding of discharge needs will improve Outcome: Progressing Goal: Individualized Educational Video(s) Outcome: Progressing   Problem: Activity: Goal: Ability to avoid complications of mobility impairment will improve Outcome: Progressing Goal: Ability to tolerate increased activity will improve Outcome: Progressing   Problem: Clinical Measurements: Goal: Postoperative complications will be avoided or minimized Outcome: Progressing   Problem: Pain Management: Goal: Pain level will decrease with appropriate interventions Outcome: Progressing   Problem: Skin Integrity: Goal: Will show signs of wound healing Outcome: Progressing   

## 2021-08-31 NOTE — TOC Progression Note (Signed)
Transition of Care [facility]) - Progression Note    Patient Details  Name: Vanessa Brock MRN: [id number] Date of Birth: 09-11-1922  Transition of Care [facility]) CM/SW [place], RN Phone Number: 08/31/2021, 1:02 PM  Clinical Narrative:     Met with the patient and her son in the room, we called her daughter [name] also, reviewed the bed offers, they chose [facility], I notified Tam[facility] at [facility]  Expected Discharge Plan: [place] Barriers to Discharge: Continued Medical Work up  Expected Discharge Plan and Services Expected Discharge Plan: [place] In-house Referral: Clinical Social Work   Post Acute Care Choice: [place] Living arrangements for the past 2 months: [place] ([place])                                       Social Determinants of Health (SDOH) Interventions    Readmission Risk Interventions     No data to display

## 2021-08-31 NOTE — Discharge Summary (Signed)
Physician Discharge Summary  Vanessa Brock DJM:[id number] DOB: 1922/05/31 DOA: 08/26/2021  PCP: [facility]  Admit date: 08/26/2021 Discharge date: 08/31/2021  Admitted From: Home Disposition: Skilled nursing facility  Recommendations for Outpatient Follow-up:  Follow up with PCP in 1-2 weeks Follow-up with [facility] office in 10 to 14 days  Home Health: No Equipment/Devices: None  Discharge Condition: Stable CODE STATUS: DNR Diet recommendation: Regular  Brief/Interim Summary: 88 y.o. female with medical history significant of  HTN, GERD, impaired fasting glucose, Afib not on anticoagulation, CHFpef, depression acute comminuted displaced and angulated proximal femoral fracture involves the lower trochanteric region and subtrochanteric Femur s/p repair which was complicated by new onset afib. Patient also has hx of admission on for gastro enteritis due to norovirus. Patient now  presents to ED s/p mechanical fall with associated complaint of injury to left leg with pain and noted deformity of leg. Patient on ros denies abdominal pain/ dysuria/ fever chills,sob/ chest pain n/v does note mild constipation. Notes pain in right leg.   Seen in consultation by orthopedic surgery.  Status post operative fixation 8/24.  No immediate postoperative complications however patient's hemoglobin has dropped to 6.0 on 8/26.  Had some AKI postoperatively that has not recovered.  Transfusing 1 unit PRBC   8/27: Hemoglobin 7.6 this AM.  Transfusing additional 1 unit PRBC with goal hemoglobin of 8   8/28: Hemoglobin stable at 10.7.  No bleeding noted.  Had multiple bowel movements.  Patient medically stable for discharge at this time.  Insurance authorization obtained and patient discharged in stable condition.      Discharge Diagnoses:  Principal Problem:   Closed left hip fracture (HCC) Active Problems:   Chronic diastolic CHF (congestive heart failure) (HCC)    Paroxysmal atrial fibrillation (HCC)   Fall   Leukocytosis   Malnutrition of moderate degree Right hip fracture Status post mechanical fall No evidence of syncopal event Pain mild to moderate Status post the OR with orthopedics 8/24 Plan: Continue diet Aspirin 81 mg p.o. twice daily for DVT prophylaxis.  6-week course Stable for discharge to skilled nursing facility Follow-up outpatient [facility] in 10 to 14 days for wound check and staple removal   AKI Noted postoperatively Likely prerenal azotemia Improving Plan: Encouraged p.o. fluid intake    Acute postoperative blood loss anemia Remains hemodynamically stable, good blood pressure on room air Hemoglobin 6.0 as of 8/26 Transfuse 1 unit on 8/26.  Hemoglobin initially responded to 8.5 Subsequently dropped to 7.6 on 8/27 Remained hemodynamically stable Hemoglobin stable at 10.7 on 8/28 Plan: No further transfusion Medically stable for discharge   Discharge Instructions  Discharge Instructions     Diet - low sodium heart healthy   Complete by: As directed    Increase activity slowly   Complete by: As directed    No wound care   Complete by: As directed       Allergies as of 08/31/2021       Reactions   Elemental Sulfur Hives   Sulfur Hives        Medication List     STOP taking these medications    ondansetron 4 MG disintegrating tablet Commonly known as: Zofran ODT   polyethylene glycol 17 g packet Commonly known as: MIRALAX / GLYCOLAX       TAKE these medications    aspirin EC 81 MG tablet Take 1 tablet (81 mg total) by mouth 2 (two) times daily. Swallow whole.   calcium-vitamin D  500-200 MG-UNIT tablet Take 1 tablet by mouth 2 (two) times daily.   DSS 100 MG Caps Take 1 capsule by mouth 2 (two) times daily as needed.   hydrALAZINE 10 MG tablet Commonly known as: APRESOLINE Take 10 mg by mouth 3 (three) times daily.   HYDROcodone-acetaminophen 5-325 MG tablet Commonly known as:  NORCO/VICODIN Take 1 tablet by mouth every 4 (four) hours as needed for moderate pain.   loperamide 2 MG capsule Commonly known as: IMODIUM Take 4 mg by mouth as needed for diarrhea or loose stools.   Magnesium Glycinate 100 MG Caps Take 2 capsules by mouth daily.   metoprolol tartrate 25 MG tablet Commonly known as: LOPRESSOR Take 1 tablet (25 mg total) by mouth 2 (two) times daily. What changed: how much to take   mirtazapine 7.5 MG tablet Commonly known as: REMERON Take 7.5 mg by mouth at bedtime.   omeprazole 20 MG capsule Commonly known as: PRILOSEC Take 1 capsule (20 mg total) by mouth daily.   phenylephrine-shark liver oil-mineral oil-petrolatum 0.25-14-74.9 % rectal ointment Commonly known as: PREPARATION H Place 1 Application rectally every 6 (six) hours as needed for hemorrhoids.   senna 8.6 MG tablet Commonly known as: SENOKOT Take 1 tablet by mouth daily.   Tiadylt ER 300 MG 24 hr capsule Generic drug: diltiazem Take 300 mg by mouth daily.        Follow-up Information     Zanette, Murilo, PA-C Follow up.   Specialty: Orthopedic Surgery Why: Follow up with Dr. Joie Bimler PA, Murilo Zanette PA-C at [facility], [place] office in 10-14 days for wound check, staple removal and x-ray. Contact information: [address] [place] [address] [phone]                Allergies  Allergen Reactions   Elemental Sulfur Hives   Sulfur Hives    Consultations: Orthopedics  Procedures/Studies: DG HIP UNILAT WITH PELVIS 2-3 VIEWS RIGHT  Result Date: 08/27/2021 CLINICAL DATA:  Intramedullary rod fixation of right femur. EXAM: DG HIP (WITH OR WITHOUT PELVIS) 2-3V RIGHT; DG C-ARM 1-60 MIN-NO REPORT Radiation exposure index: 23.37 mGy. COMPARISON:  Radiographs of same day. FINDINGS: Five intraoperative fluoroscopic images were obtained of the right femur. These demonstrate intramedullary rod fixation of proximal right femoral  fracture. IMPRESSION: Fluoroscopic guidance provided during intramedullary rod fixation of proximal right femoral fracture. Electronically Signed   By: Lupita Raider M.D.   On: 08/27/2021 15:46   DG C-Arm 1-60 Min-No Report  Result Date: 08/27/2021 Fluoroscopy was utilized by the requesting physician.  No radiographic interpretation.   DG C-Arm 1-60 Min-No Report  Result Date: 08/27/2021 Fluoroscopy was utilized by the requesting physician.  No radiographic interpretation.   DG Chest Portable 1 View  Result Date: 08/26/2021 CLINICAL DATA:  Fall with hip fracture EXAM: PORTABLE CHEST 1 VIEW COMPARISON:  09/06/2020 FINDINGS: Mild cardiomegaly. No acute airspace disease. Apical scarring. Aortic atherosclerosis. IMPRESSION: No active disease.  Mild cardiomegaly Electronically Signed   By: Jasmine Pang M.D.   On: 08/26/2021 20:22   DG Hip Unilat W or Wo Pelvis 2-3 Views Right  Result Date: 08/26/2021 CLINICAL DATA:  Fall EXAM: DG HIP (WITH OR WITHOUT PELVIS) 2-3V RIGHT COMPARISON:  06/17/2020 FINDINGS: SI joints are non widened. Chronic left pubic rami fractures. Intramedullary rod in the left femur. Acute comminuted fracture involves the lower trochanteric femur as well as the subtrochanteric shaft. Displaced lesser trochanteric fracture fragment. Moderate apex lateral and anterior angulation.  No femoral head dislocation IMPRESSION: 1. Acute comminuted displaced and angulated proximal femoral fracture involves the lower trochanteric region and subtrochanteric femur 2. Old left pubic rami fractures Electronically Signed   By: Jasmine Pang M.D.   On: 08/26/2021 20:20      Subjective: Seen and examined on the day of discharge.  Sitting up in chair.  Pain mild to moderate.  Had multiple bowel movements.  Appropriate for discharge to skilled nursing facility.  Discharge Exam: Vitals:   08/31/21 0415 08/31/21 0824  BP: (!) 145/64 (!) 176/99  Pulse: 87 68  Resp: 17 16  Temp: 97.9 F (36.6 C)  97.9 F (36.6 C)  SpO2: 97% 98%   Vitals:   08/30/21 1501 08/30/21 2153 08/31/21 0415 08/31/21 0824  BP: 134/71 (!) 177/85 (!) 145/64 (!) 176/99  Pulse: (!) 105 99 87 68  Resp: 16 20 17 16   Temp: 98.5 F (36.9 C) 98.6 F (37 C) 97.9 F (36.6 C) 97.9 F (36.6 C)  TempSrc:      SpO2: 94% 98% 97% 98%  Weight:      Height:        General: Pt is alert, awake, not in acute distress Cardiovascular: RRR, S1/S2 +, no rubs, no gallops Respiratory: CTA bilaterally, no wheezing, no rhonchi Abdominal: Soft, NT, ND, bowel sounds + Extremities: no edema, no cyanosis    The results of significant diagnostics from this hospitalization (including imaging, microbiology, ancillary and laboratory) are listed below for reference.     Microbiology: No results found for this or any previous visit (from the past 240 hour(s)).   Labs: BNP (last 3 results) Recent Labs    07/20/21 0150  BNP 108.1*   Basic Metabolic Panel: Recent Labs  Lab 08/27/21 0208 08/28/21 0321 08/29/21 0516 08/30/21 0537 08/31/21 0839  NA 135 136 134* 135 137  K 4.4 4.4 4.1 4.0 3.8  CL 104 105 106 107 106  CO2 26 25 22 24 23   GLUCOSE 192* 164* 144* 116* 120*  BUN 27* 40* 37* 25* 26*  CREATININE 1.00 1.71* 1.24* 0.86 0.89  CALCIUM 8.5* 8.6* 7.9* 8.3* 8.6*   Liver Function Tests: Recent Labs  Lab 08/27/21 0012  ALBUMIN 3.7   No results for input(s): "LIPASE", "AMYLASE" in the last 168 hours. No results for input(s): "AMMONIA" in the last 168 hours. CBC: Recent Labs  Lab 08/26/21 2022 08/27/21 0012 08/27/21 0208 08/28/21 0321 08/29/21 0516 08/29/21 1304 08/30/21 0537 08/30/21 1332 08/31/21 0839  WBC 19.0* 17.1* 14.6* 11.9* 11.4*  --  10.9*  --  12.1*  NEUTROABS 14.5* 14.7*  --   --   --   --  8.1*  --  8.5*  HGB 12.2 11.4* 10.4* 8.1* 6.0* 8.5* 7.6* 9.4* 10.7*  HCT 37.4 34.7* 31.2* 24.7* 18.3* 25.6* 23.2* 29.0* 32.3*  MCV 96.1 95.3 95.1 96.5 95.3  --  90.3  --  87.5  PLT 260 239 228 219 159   --  176  --  251   Cardiac Enzymes: No results for input(s): "CKTOTAL", "CKMB", "CKMBINDEX", "TROPONINI" in the last 168 hours. BNP: Invalid input(s): "POCBNP" CBG: No results for input(s): "GLUCAP" in the last 168 hours. D-Dimer No results for input(s): "DDIMER" in the last 72 hours. Hgb A1c No results for input(s): "HGBA1C" in the last 72 hours. Lipid Profile No results for input(s): "CHOL", "HDL", "LDLCALC", "TRIG", "CHOLHDL", "LDLDIRECT" in the last 72 hours. Thyroid function studies No results for input(s): "TSH", "T4TOTAL", "T3FREE", "THYROIDAB" in  the last 72 hours.  Invalid input(s): "FREET3" Anemia work up No results for input(s): "VITAMINB12", "FOLATE", "FERRITIN", "TIBC", "IRON", "RETICCTPCT" in the last 72 hours. Urinalysis    Component Value Date/Time   COLORURINE YELLOW (A) 08/27/2021 0727   APPEARANCEUR HAZY (A) 08/27/2021 0727   LABSPEC 1.020 08/27/2021 0727   PHURINE 5.0 08/27/2021 0727   GLUCOSEU NEGATIVE 08/27/2021 0727   HGBUR NEGATIVE 08/27/2021 0727   BILIRUBINUR NEGATIVE 08/27/2021 0727   KETONESUR 5 (A) 08/27/2021 0727   PROTEINUR 30 (A) 08/27/2021 0727   NITRITE NEGATIVE 08/27/2021 0727   LEUKOCYTESUR NEGATIVE 08/27/2021 0727   Sepsis Labs Recent Labs  Lab 08/28/21 0321 08/29/21 0516 08/30/21 0537 08/31/21 0839  WBC 11.9* 11.4* 10.9* 12.1*   Microbiology No results found for this or any previous visit (from the past 240 hour(s)).   Time coordinating discharge: Over 30 minutes  SIGNED:   Tresa Moore, MD  [facility] 08/31/2021, 1:21 PM Pager   If 7PM-7AM, please contact night-coverage

## 2021-08-31 NOTE — Progress Notes (Signed)
Physical Therapy Treatment Patient Details Name: Vanessa Brock MRN: [id number] DOB: 10/29/1922 Today's Date: 08/31/2021   History of Present Illness Patient is a 88 yo female s/p R  hip IM nailing. PMH of L hip ORIF, GERD, HTN, afib    PT Comments    Pt in chair, feeling good.  Pt stood with mod a x 1 to RW.  Completed standing AROM LLE x 10 and pre-gait weight shifting.  After seated rest and seated AROM, she is able to stand again and take 3 cautious steps forward and backwards with RW and mod a x 1.  Fatigues quickly and opts to sit verses trying again.     Recommendations for follow up therapy are one component of a multi-disciplinary discharge planning process, led by the attending physician.  Recommendations may be updated based on patient status, additional functional criteria and insurance authorization.  Follow Up Recommendations  Skilled nursing-short term rehab (<3 hours/day)     Assistance Recommended at Discharge Frequent or constant Supervision/Assistance  Patient can return home with the following A lot of help with walking and/or transfers;A lot of help with bathing/dressing/bathroom;Assistance with feeding;Assistance with cooking/housework;Help with stairs or ramp for entrance;Assist for transportation   Equipment Recommendations  None recommended by PT    Recommendations for Other Services       Precautions / Restrictions Restrictions Weight Bearing Restrictions: Yes RLE Weight Bearing: Weight bearing as tolerated     Mobility  Bed Mobility               General bed mobility comments: NT, up in recliner    Transfers Overall transfer level: Needs assistance Equipment used: Rolling walker (2 wheels) Transfers: Sit to/from Stand Sit to Stand: Mod assist                Ambulation/Gait Ambulation/Gait assistance: Min assist Gait Distance (Feet): 3 Feet Assistive device: Rolling walker (2 wheels) Gait Pattern/deviations: Step-to pattern Gait  velocity: decreased     General Gait Details: able to take 3 steps forward and back with min/mod a x 1.  fatigue limited.   Stairs             Wheelchair Mobility    Modified Rankin (Stroke Patients Only)       Balance Overall balance assessment: Needs assistance Sitting-balance support: Feet supported Sitting balance-Leahy Scale: Good     Standing balance support: Reliant on assistive device for balance, Bilateral upper extremity supported Standing balance-Leahy Scale: Poor Standing balance comment: heavily reliant on RW                            Cognition Arousal/Alertness: Awake/alert Behavior During Therapy: WFL for tasks assessed/performed Overall Cognitive Status: Within Functional Limits for tasks assessed                                          Exercises Other Exercises Other Exercises: seated and sitting AROM LLE x 10    General Comments        Pertinent Vitals/Pain Pain Assessment Pain Assessment: Faces Faces Pain Scale: Hurts little more Pain Descriptors / Indicators: Aching, Grimacing, Sore Pain Intervention(s): Limited activity within patient's tolerance, Monitored during session, Repositioned    Home Living  Prior Function            PT Goals (current goals can now be found in the care plan section) Progress towards PT goals: Progressing toward goals    Frequency    7X/week      PT Plan Current plan remains appropriate    Co-evaluation              AM-PAC PT "6 Clicks" Mobility   Outcome Measure  Help needed turning from your back to your side while in a flat bed without using bedrails?: A Little Help needed moving from lying on your back to sitting on the side of a flat bed without using bedrails?: A Little Help needed moving to and from a bed to a chair (including a wheelchair)?: A Lot Help needed standing up from a chair using your arms (e.g.,  wheelchair or bedside chair)?: A Lot Help needed to walk in hospital room?: A Lot Help needed climbing 3-5 steps with a railing? : Total 6 Click Score: 13    End of Session Equipment Utilized During Treatment: Gait belt Activity Tolerance: Patient tolerated treatment well;Patient limited by fatigue Patient left: in chair;with chair alarm set;with call bell/phone within reach;with family/visitor present Nurse Communication: Mobility status PT Visit Diagnosis: Other abnormalities of gait and mobility (R26.89);Muscle weakness (generalized) (M62.81);Difficulty in walking, not elsewhere classified (R26.2);Pain Pain - Right/Left: Right Pain - part of body: Hip     Time: 4158-3094 PT Time Calculation (min) (ACUTE ONLY): 13 min  Charges:  $Gait Training: 8-22 mins                   Danielle Dess, PTA 08/31/21, 10:51 AM

## 2021-08-31 NOTE — Progress Notes (Signed)
Occupational Therapy Treatment Patient Details Name: Vanessa Brock MRN: [id number] DOB: Sep 25, 1922 Today's Date: 08/31/2021   History of present illness Patient is a 88 yo female s/p R  hip IM nailing. PMH of L hip ORIF, GERD, HTN, afib   OT comments  Pt seen for OT tx this date. Pt in recliner, agreeable to session. Pt required MIN-MOD A + VC for hand placement and anterior weight shift prior to lift off for transfers from recliner and from [facility] after toileting. MIN A for amb in room c RW. Pt tolerated standing with UE support on RW and able to complete pericare in standing with CGA and set up. Pt demonstrating progress towards goals. Continues to benefit from skilled OT services. Continue to recommend SNF.    Recommendations for follow up therapy are one component of a multi-disciplinary discharge planning process, led by the attending physician.  Recommendations may be updated based on patient status, additional functional criteria and insurance authorization.    Follow Up Recommendations  Skilled nursing-short term rehab (<3 hours/day)    Assistance Recommended at Discharge Frequent or constant Supervision/Assistance  Patient can return home with the following  A lot of help with walking and/or transfers;A lot of help with bathing/dressing/bathroom;Assistance with cooking/housework;Assist for transportation;Help with stairs or ramp for entrance;Direct supervision/assist for medications management   Equipment Recommendations  BSC/3in1    Recommendations for Other Services      Precautions / Restrictions Precautions Precautions: Fall Restrictions Weight Bearing Restrictions: Yes RLE Weight Bearing: Weight bearing as tolerated       Mobility Bed Mobility               General bed mobility comments: NT, up in recliner    Transfers Overall transfer level: Needs assistance Equipment used: Rolling walker (2 wheels) Transfers: Sit to/from Stand Sit to Stand: Mod assist, Min  assist           General transfer comment: VC for scooting forward prior to standing and for hand placement     Balance Overall balance assessment: Needs assistance Sitting-balance support: Feet supported Sitting balance-Leahy Scale: Good     Standing balance support: Single extremity supported, During functional activity, Reliant on assistive device for balance Standing balance-Leahy Scale: Poor                             ADL either performed or assessed with clinical judgement   ADL                                         General ADL Comments: Pt required MIN-MOD A for BSC and recliner transfers, CGA for pericare in standing after setup, and MIN A for ambulation with RW to/from [facility]    Extremity/Trunk Assessment              Vision       Perception     Praxis      Cognition Arousal/Alertness: Awake/alert Behavior During Therapy: WFL for tasks assessed/performed Overall Cognitive Status: Within Functional Limits for tasks assessed                                          Exercises      Shoulder Instructions  General Comments      Pertinent Vitals/ Pain       Pain Assessment Pain Assessment: Faces Faces Pain Scale: Hurts little more Pain Location: R hip Pain Descriptors / Indicators: Aching, Grimacing, Sore Pain Intervention(s): Limited activity within patient's tolerance, Monitored during session, Repositioned  Home Living                                          Prior Functioning/Environment              Frequency  Min 2X/week        Progress Toward Goals  OT Goals(current goals can now be found in the care plan section)  Progress towards OT goals: Progressing toward goals  Acute Rehab OT Goals Patient Stated Goal: get better at rehab OT Goal Formulation: With patient/family Time For Goal Achievement: 09/11/21 Potential to Achieve Goals: Good  Plan Discharge  plan remains appropriate;Frequency remains appropriate    Co-evaluation                 AM-PAC OT "6 Clicks" Daily Activity     Outcome Measure   Help from another person eating meals?: None Help from another person taking care of personal grooming?: None Help from another person toileting, which includes using toliet, bedpan, or urinal?: A Little Help from another person bathing (including washing, rinsing, drying)?: A Lot Help from another person to put on and taking off regular upper body clothing?: A Little Help from another person to put on and taking off regular lower body clothing?: A Lot 6 Click Score: 18    End of Session Equipment Utilized During Treatment: Rolling walker (2 wheels)  OT Visit Diagnosis: Other abnormalities of gait and mobility (R26.89);Muscle weakness (generalized) (M62.81);Pain Pain - Right/Left: Right Pain - part of body: Hip   Activity Tolerance Patient tolerated treatment well   Patient Left with call bell/phone within reach;in chair;with chair alarm set   Nurse Communication          Time: 7253-6644 OT Time Calculation (min): 11 min  Charges: OT General Charges $OT Visit: 1 Visit OT Treatments $Self Care/Home Management : 8-22 mins  Arman Filter., MPH, MS, OTR/L ascom [phone] 08/31/21, 1:11 PM

## 2021-10-12 ENCOUNTER — Encounter: Payer: Self-pay | Admitting: *Deleted

## 2021-10-13 ENCOUNTER — Ambulatory Visit: Payer: Medicare Other | Admitting: Medical

## 2021-11-12 ENCOUNTER — Encounter: Payer: Self-pay | Admitting: Cardiovascular Disease

## 2021-11-25 IMAGING — CT CT HEAD W/O CM
4 series · 17 of 47 positions shown, 19 images · non-contrast
Comparison: None.

CLINICAL DATA: Headache, dizziness, vomiting

EXAM:
CT HEAD WITHOUT CONTRAST
TECHNIQUE: Contiguous axial images were obtained from the base of the skull
through the vertex without intravenous contrast.

[Series 2: head bone · axial · 0.39mm/px · z∈[-104,-58]mm · 4 of 67 slices shown]
[im 7/67  bone]
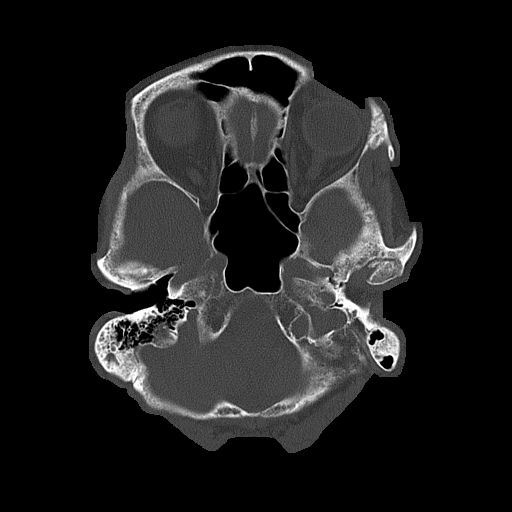
[im 14/67  bone]
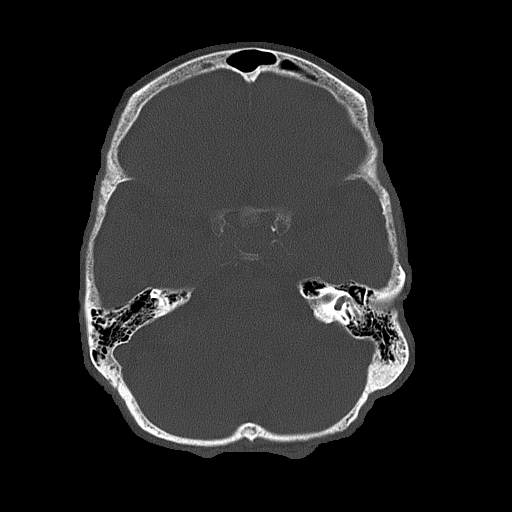
[im 20/67  bone]
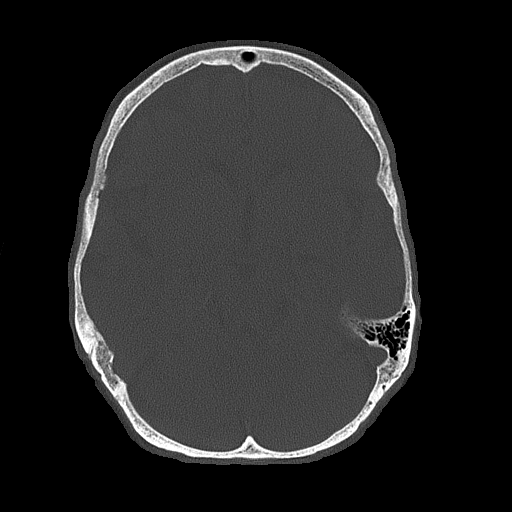
[im 30/67  bone]
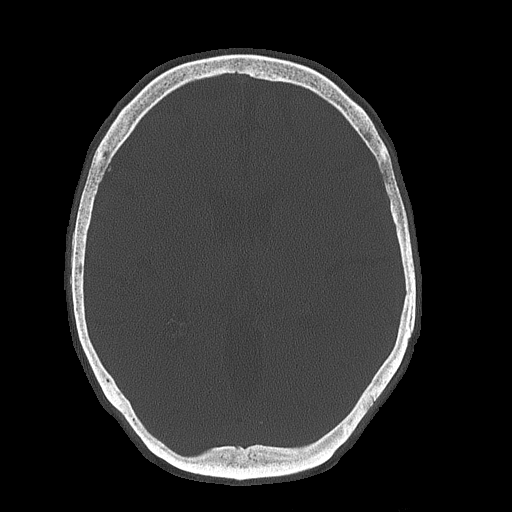

[Series 3: head wo · axial · 0.39mm/px · z∈[-101,-6]mm · 7 of 27 slices shown, 9 images]
[im 4/27  brain]
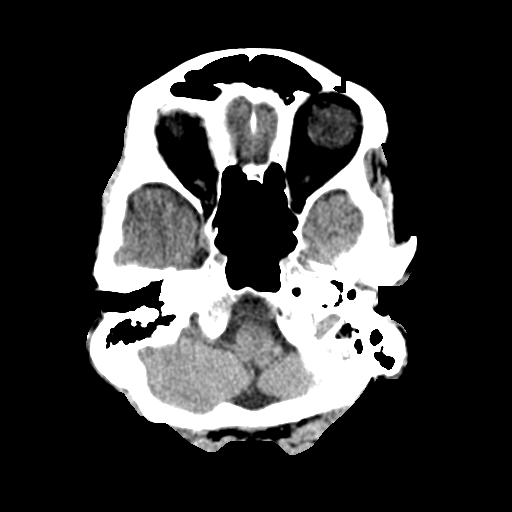
[im 4/27  bone]
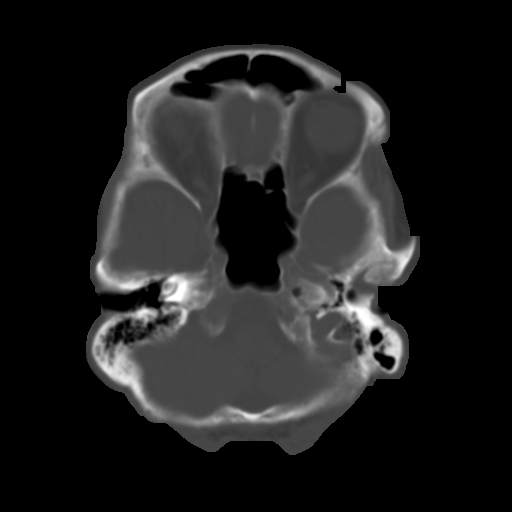
[im 7/27  brain]
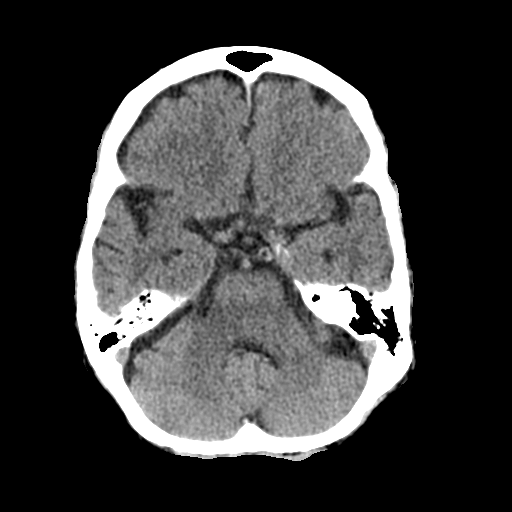
[im 10/27  brain]
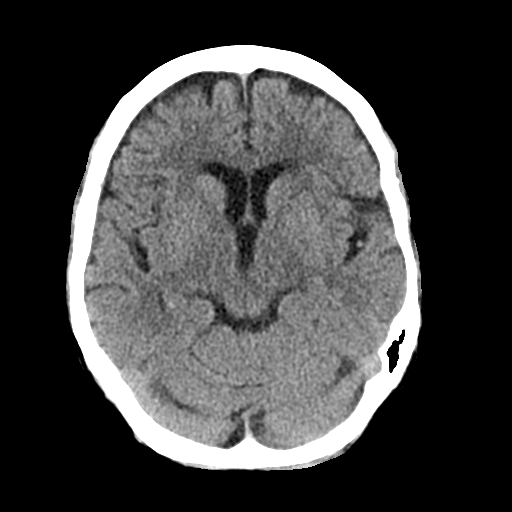
[im 14/27  brain]
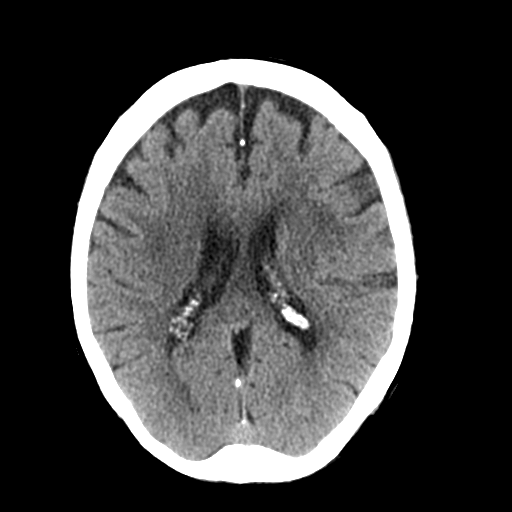
[im 17/27  brain]
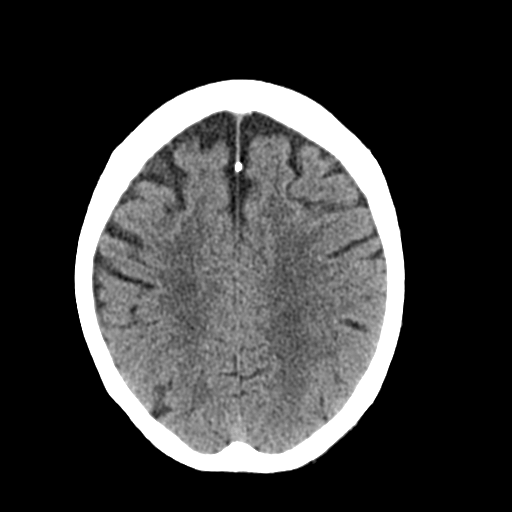
[im 17/27  bone]
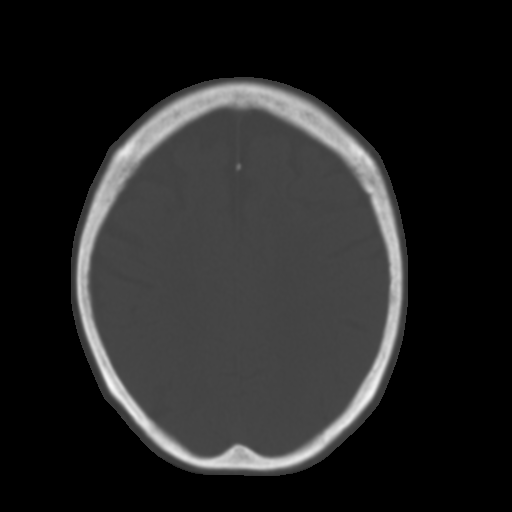
[im 20/27  brain]
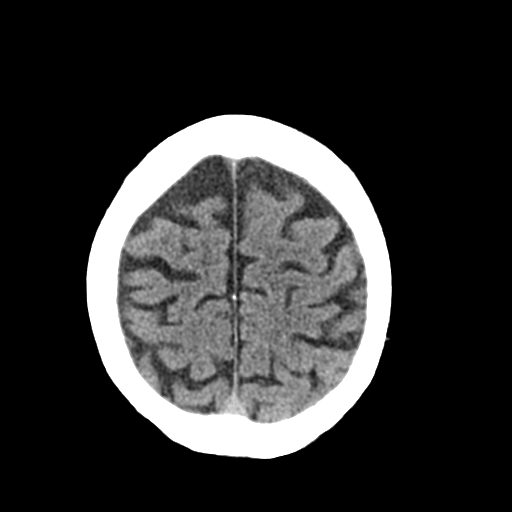
[im 23/27  brain]
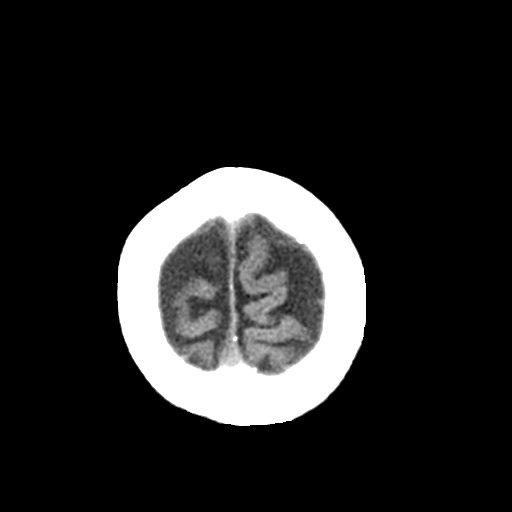

[Series 4: coronal soft tissue · coronal · 0.29mm/px · 3 of 60 slices shown]
[im 20/60  brain]
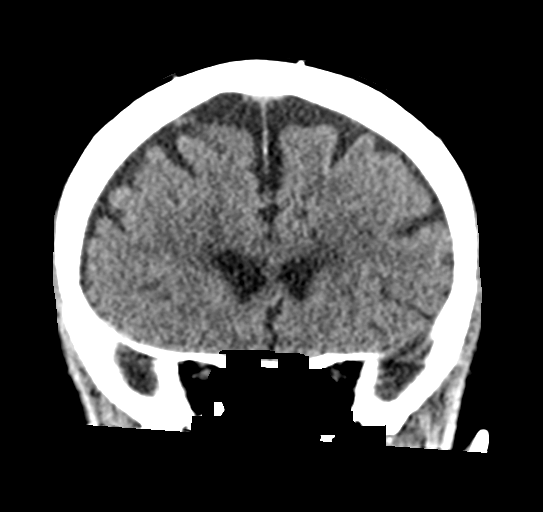
[im 27/60  brain]
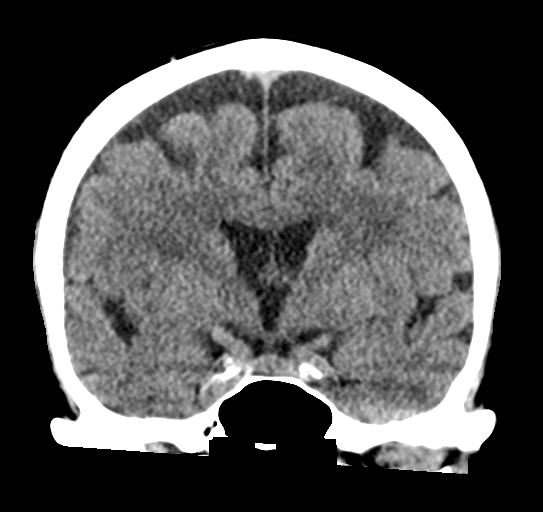
[im 33/60  brain]
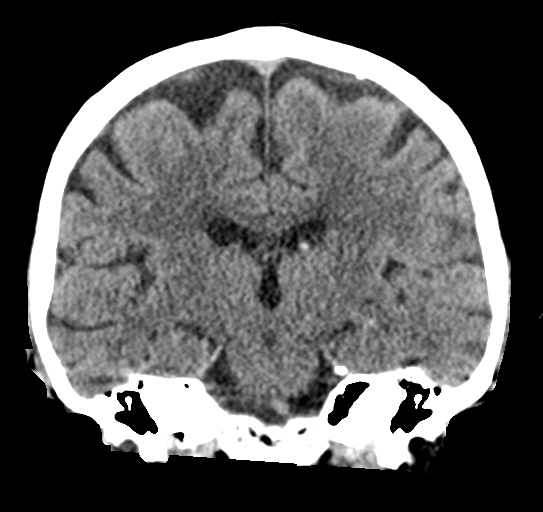

[Series 5: sagittal soft tissue · sagittal · 0.29mm/px · 3 of 51 slices shown]
[im 17/51  brain]
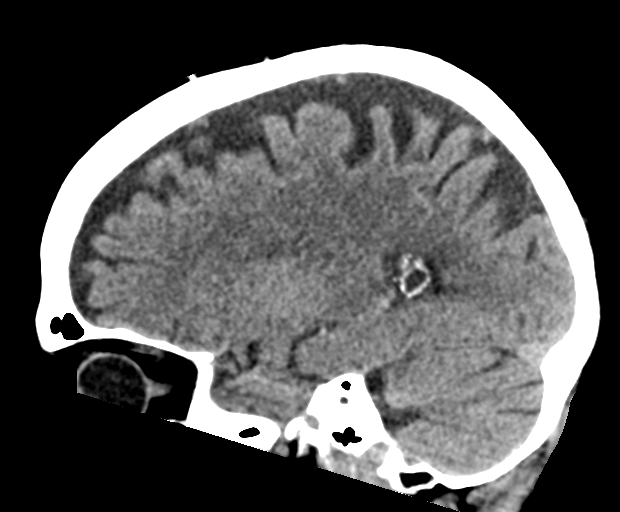
[im 26/51  brain]
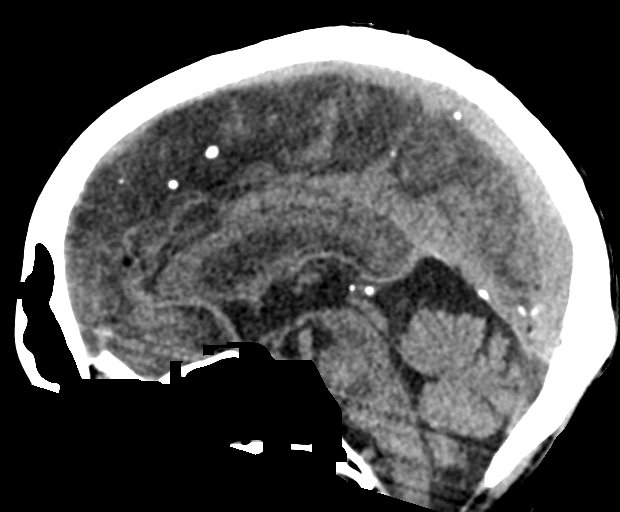
[im 34/51  brain]
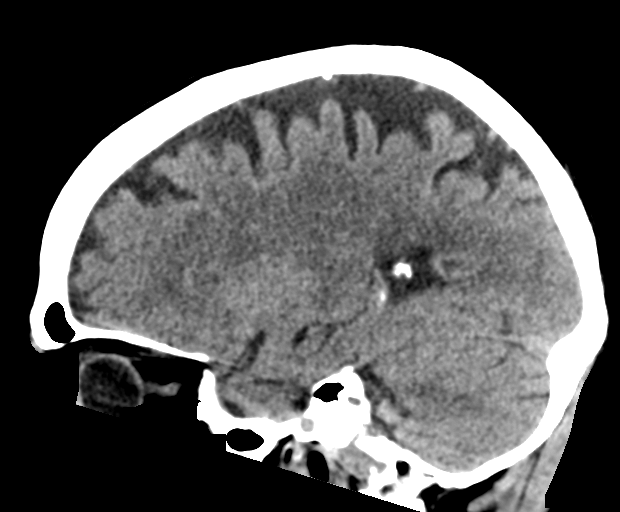

[17 of 47 positions shown; findings below may reference images not displayed]

FINDINGS: Brain: Age related atrophy pattern and chronic white matter
microvascular changes throughout both cerebral hemispheres. No acute
intracranial hemorrhage, new mass lesion, acute infarction, midline
shift, herniation, hydrocephalus, or extra-axial fluid collection.
No focal mass effect or edema. Cisterns are patent. Cerebellar
atrophy as well.

Vascular: Intracranial atherosclerosis at the skull base. No
hyperdense vessel.

Skull: Normal. Negative for fracture or focal lesion.

Sinuses/Orbits: No acute finding.

Other: None.
IMPRESSION: Atrophy and white matter microvascular ischemic changes.

No acute intracranial abnormality by noncontrast CT.

## 2022-02-10 ENCOUNTER — Emergency Department: Payer: Medicare Other

## 2022-02-10 ENCOUNTER — Emergency Department
Admission: EM | Admit: 2022-02-10 | Discharge: 2022-02-10 | Disposition: A | Discharge status: Home / Self Care | Payer: Medicare Other | Attending: Emergency Medicine | Admitting: Emergency Medicine

## 2022-02-10 ENCOUNTER — Encounter: Payer: Self-pay | Admitting: Emergency Medicine

## 2022-02-10 ENCOUNTER — Other Ambulatory Visit: Payer: Self-pay

## 2022-02-10 DIAGNOSIS — I5032 Chronic diastolic (congestive) heart failure: Secondary | ICD-10-CM | POA: Diagnosis not present

## 2022-02-10 DIAGNOSIS — S79912A Unspecified injury of left hip, initial encounter: Secondary | ICD-10-CM | POA: Diagnosis present

## 2022-02-10 DIAGNOSIS — N1831 Chronic kidney disease, stage 3a: Secondary | ICD-10-CM | POA: Diagnosis not present

## 2022-02-10 DIAGNOSIS — I130 Hypertensive heart and chronic kidney disease with heart failure and stage 1 through stage 4 chronic kidney disease, or unspecified chronic kidney disease: Secondary | ICD-10-CM | POA: Insufficient documentation

## 2022-02-10 DIAGNOSIS — W19XXXA Unspecified fall, initial encounter: Secondary | ICD-10-CM

## 2022-02-10 DIAGNOSIS — R0789 Other chest pain: Secondary | ICD-10-CM

## 2022-02-10 DIAGNOSIS — R0781 Pleurodynia: Secondary | ICD-10-CM | POA: Diagnosis not present

## 2022-02-10 DIAGNOSIS — Y92002 Bathroom of unspecified non-institutional (private) residence single-family (private) house as the place of occurrence of the external cause: Secondary | ICD-10-CM | POA: Insufficient documentation

## 2022-02-10 DIAGNOSIS — Z79899 Other long term (current) drug therapy: Secondary | ICD-10-CM | POA: Diagnosis not present

## 2022-02-10 DIAGNOSIS — W010XXA Fall on same level from slipping, tripping and stumbling without subsequent striking against object, initial encounter: Secondary | ICD-10-CM | POA: Diagnosis not present

## 2022-02-10 DIAGNOSIS — S32475A Nondisplaced fracture of medial wall of left acetabulum, initial encounter for closed fracture: Secondary | ICD-10-CM

## 2022-02-10 MED ORDER — ACETAMINOPHEN 325 MG PO TABS
650.0000 mg | ORAL_TABLET | Freq: Once | ORAL | Status: AC
  Administered 2022-02-10: 650 mg via ORAL
  Filled 2022-02-10: qty 2

## 2022-02-10 NOTE — ED Notes (Signed)
Pt Dc to home. Dc instructions reviewed with all questions answered. Pt voice understanding. Pt assisted out of dept via wheelchair with family member to transport back to [facility]

## 2022-02-10 NOTE — ED Provider Notes (Signed)
[facility] Provider Note    Event Date/Time   First MD Initiated Contact with Patient 02/10/22 0200     (approximate)   History   Fall   HPI  History obtained via patient and her daughter  Vanessa Brock is a 89 y.o. female who presents to the ED from [place] with a chief complaint of left side pain.  Patient reports she stumbled and fell in the bathroom 3 days ago, landing on her left side.  Complains of persistent pain to her left ribs and hip.  Ambulatory with walker.  Denies anticoagulant use.  Denies striking head or LOC.  Denies fever/chills, cough, shortness of breath, abdominal pain, nausea, vomiting or dizziness.     Past Medical History   Past Medical History:  Diagnosis Date   AKI (acute kidney injury) ([place])    a. 02/2018 in setting of hip fx -->improved w/ IVF.   Closed displaced fracture of left femoral neck ([place] [facility])    a. 02/2018 s/p ORIF.   Diastolic dysfunction    a. 02/2018 Limited Echo: EF > 65%, impaired diast relaxation. Nl RV fxn.    GERD (gastroesophageal reflux disease)    Hypertension    Impaired fasting glucose    Persistent atrial fibrillation ([place] Brock)    a. 02/2018 Dx following ORIF L hip 2/2 fx; b. CHA2DS2VASc = 4-->Eliquis.     Active Problem List   Patient Active Problem List   Diagnosis Date Noted   Malnutrition of moderate degree 08/27/2021   Closed left hip fracture (HCC) 08/26/2021   Nausea vomiting and diarrhea 07/20/2021   Hypertension    Atrial fibrillation, chronic (HCC)    GERD (gastroesophageal reflux disease)    Depression    Acute renal failure superimposed on stage 3a chronic kidney disease (HCC)    Myocardial injury    Hypokalemia    Chronic diastolic CHF (congestive heart failure) ([place] Brock)    Macrocytic anemia 07/02/2020   Fall 06/25/2020   CKD (chronic kidney disease) 06/25/2020   Leukocytosis 06/25/2020   Paroxysmal atrial fibrillation ([place]essa Brock) 07/21/2018   Hip fracture ([place]essa Brock) 02/05/2018     Past  Surgical History   Past Surgical History:  Procedure Laterality Date   ABDOMINAL HYSTERECTOMY     CHOLECYSTECTOMY     FEMUR IM NAIL Right 08/27/2021   Procedure: INTRAMEDULLARY (IM) NAIL FEMORAL;  Surgeon: Renee Harder, MD;  Location: [facility] ORS;  Service: Orthopedics;  Laterality: Right;   HIP SURGERY  02/06/2018   INTRAMEDULLARY (IM) NAIL INTERTROCHANTERIC Left 02/06/2018   Procedure: INTRAMEDULLARY (IM) NAIL INTERTROCHANTRIC;  Surgeon: Dereck Leep, MD;  Location: [facility] ORS;  Service: Orthopedics;  Laterality: Left;     Home Medications   Prior to Admission medications   Medication Sig Start Date End Date Taking? Authorizing Provider  Calcium Carb-Cholecalciferol (CALCIUM-VITAMIN D) 500-200 MG-UNIT tablet Take 1 tablet by mouth 2 (two) times daily.    [provider]  Docusate Sodium (DSS) 100 MG CAPS Take 1 capsule by mouth 2 (two) times daily as needed. Patient not taking: Reported on 08/27/2021    [provider]  hydrALAZINE (APRESOLINE) 10 MG tablet Take 10 mg by mouth 3 (three) times daily. 07/07/21   [provider]  HYDROcodone-acetaminophen (NORCO/VICODIN) 5-325 MG tablet Take 1 tablet by mouth every 4 (four) hours as needed for moderate pain. 08/27/21 08/27/22  Renee Harder, MD  loperamide (IMODIUM) 2 MG capsule Take 4 mg by mouth as needed for diarrhea or loose stools.  Patient not taking: Reported on 08/27/2021    [provider]  Magnesium Glycinate 100 MG CAPS Take 2 capsules by mouth daily.    [provider]  metoprolol tartrate (LOPRESSOR) 25 MG tablet Take 1 tablet (25 mg total) by mouth 2 (two) times daily. 08/31/21   Sidney Ace, MD  mirtazapine (REMERON) 7.5 MG tablet Take 7.5 mg by mouth at bedtime. 02/06/21   [provider]  omeprazole (PRILOSEC) 20 MG capsule Take 1 capsule (20 mg total) by mouth daily. 07/09/20   Medina-Vargas, Monina C, NP  phenylephrine-shark liver oil-mineral oil-petrolatum  (PREPARATION H) 0.25-14-74.9 % rectal ointment Place 1 Application rectally every 6 (six) hours as needed for hemorrhoids.    [provider]  senna (SENOKOT) 8.6 MG tablet Take 1 tablet by mouth daily.    [provider]  TIADYLT ER 300 MG 24 hr capsule Take 300 mg by mouth daily. 02/06/21   [provider]     Allergies  Elemental sulfur and Sulfur   Family History   Family History  Problem Relation Age of Onset   Heart disease Mother        died @ 33 - ? heart issues   Other Father        died @ 34   Other Sister        12     Physical Exam  Triage Vital Signs: ED Triage Vitals [02/10/22 0157]  Enc Vitals Group     BP (!) 170/75     Pulse Rate 71     Resp 18     Temp (!) 97.5 F (36.4 Brock)     Temp Source Oral     SpO2 93 %     Weight 95 lb (43.1 kg)     Height 5\' 3"  (1.6 m)     Head Circumference      Peak Flow      Pain Score 4     Pain Loc      Pain Edu?      Excl. in [place]?     Updated Vital Signs: BP (!) 160/59   Pulse 68   Temp (!) 97.5 F (36.4 Brock) (Oral)   Resp 18   Ht 5\' 3"  (1.6 m)   Wt 43.1 kg   SpO2 93%   BMI 16.83 kg/m    General: Awake, no distress.  CV:  RRR.  Good peripheral perfusion.  Resp:  Normal effort.  CTAB.  Mild splinting.  Left lateral ribs tender to palpation.  No crepitus. Abd:  Nontender.  No distention.  Other:  Cervical spine, thoracic spine and lumbar spine nontender to palpation.  Pelvis is stable.  Left hip tender to palpation with full range of motion.   ED Results / Procedures / Treatments  Labs (all labs ordered are listed, but only abnormal results are displayed) Labs Reviewed - No data to display   EKG  None   RADIOLOGY I have independently visualized and interpreted patient's x-rays as well as noted the radiology interpretation:  Left rib x-rays: Chronic changes, no acute; no pneumothorax  Left hip x-rays: Chronic pelvic fractures, acute nondisplaced fracture medial left  acetabulum  Official radiology report(s): DG Ribs Unilateral W/Chest Left  Result Date: 02/10/2022 CLINICAL DATA:  Status post fall. EXAM: LEFT RIBS AND CHEST - 3+ VIEW COMPARISON:  Or August 26, 2021 FINDINGS: No acute fracture or other bone lesions are seen involving the ribs. Chronic third and fourth left  rib fractures are seen. There is no evidence of pneumothorax or pleural effusion. Both lungs are clear. Heart size and mediastinal contours are within normal limits. IMPRESSION: Chronic third and fourth left rib fractures. Electronically Signed   By: Virgina Norfolk M.D.   On: 02/10/2022 03:04   DG Hip Unilat With Pelvis 2-3 Views Left  Result Date: 02/10/2022 CLINICAL DATA:  Status post fall. EXAM: DG HIP (WITH OR WITHOUT PELVIS) 2-3V LEFT COMPARISON:  None Available. FINDINGS: Acute, nondisplaced fracture deformity is seen along the medial aspect of the left acetabulum. Bilateral femoral intramedullary rods and compression screw device is are seen. Chronic fracture deformities are also seen involving the symphysis pubis and the left superior and left inferior pubic rami. IMPRESSION: 1. Acute, nondisplaced fracture along the medial aspect of the left acetabulum. 2. Chronic fracture deformities, as described above. Electronically Signed   By: Virgina Norfolk M.D.   On: 02/10/2022 03:03     PROCEDURES:  Critical Care performed: No  Procedures   MEDICATIONS ORDERED IN ED: Medications  acetaminophen (TYLENOL) tablet 650 mg (650 mg Oral Given 02/10/22 0223)     IMPRESSION / MDM / ASSESSMENT AND PLAN / ED COURSE  I reviewed the triage vital signs and the nursing notes.                             89 year old female who presents with left rib and hip pain 3 days status post mechanical fall.  Differential diagnosis includes but is not limited to contusion, fracture, dislocation, etc.  I have personally reviewed patient's records and note right hip surgery August 2023.  Patient's  presentation is most consistent with acute complicated illness / injury requiring diagnostic workup.  Will administer Tylenol for pain, obtain imaging of left ribs and hip.  Will reassess.  Clinical Course as of 02/10/22 0341  Wed Feb 10, 2022  5830 Discussed hip x-ray findings with on-call orthopedist Dr. Roland Rack who reviewed images; recommends nonoperative treatment, advises toe-touch weightbearing only.  Updated patient and daughter.  Patient will follow-up with orthopedics closely.  Strict return precautions given.  Both verbalized understanding and agree with plan of care. [JS]    Clinical Course User Index [JS] Paulette Blanch, MD     FINAL CLINICAL IMPRESSION(S) / ED DIAGNOSES   Final diagnoses:  Fall, initial encounter  Closed nondisplaced fracture of medial wall of left acetabulum, initial encounter ([place])  Chest wall pain     Rx / DC Orders   ED Discharge Orders     None        Note:  This document was prepared using Dragon voice recognition software and may include unintentional dictation errors.   Paulette Blanch, MD 02/10/22 [phone]

## 2022-02-10 NOTE — Discharge Instructions (Addendum)
Do not bear weight on left leg; only put toe-touch weight. Continue to use your walker. You may take Tylenol as needed for pain. Return to the ER for worsening symptoms, persistent vomiting, difficulty breathing or other concerns.

## 2022-02-10 NOTE — ED Triage Notes (Signed)
Pt to room 19 via w/c accomp by daughter who brings pt in from [facility]; pt reports she stumbled and fell in the BR on Sunday landing on left side; c/o persistent pain to left side back/ribs and extending down left side leg; denies hitting head or LOC; pt assisted out of clothing and into hosp gown; pt able to stand and transfer with assist

## 2022-07-02 ENCOUNTER — Emergency Department: Payer: Medicare Other

## 2022-07-02 ENCOUNTER — Emergency Department
Admission: EM | Admit: 2022-07-02 | Discharge: 2022-07-02 | Disposition: A | Discharge status: Home / Self Care | Payer: Medicare Other | Attending: Emergency Medicine | Admitting: Emergency Medicine

## 2022-07-02 ENCOUNTER — Other Ambulatory Visit: Payer: Self-pay

## 2022-07-02 ENCOUNTER — Encounter: Payer: Self-pay | Admitting: Emergency Medicine

## 2022-07-02 DIAGNOSIS — W0110XA Fall on same level from slipping, tripping and stumbling with subsequent striking against unspecified object, initial encounter: Secondary | ICD-10-CM | POA: Diagnosis not present

## 2022-07-02 DIAGNOSIS — I10 Essential (primary) hypertension: Secondary | ICD-10-CM | POA: Diagnosis not present

## 2022-07-02 DIAGNOSIS — S52531A Colles' fracture of right radius, initial encounter for closed fracture: Secondary | ICD-10-CM | POA: Diagnosis not present

## 2022-07-02 DIAGNOSIS — S52611A Displaced fracture of right ulna styloid process, initial encounter for closed fracture: Secondary | ICD-10-CM | POA: Insufficient documentation

## 2022-07-02 DIAGNOSIS — M25531 Pain in right wrist: Secondary | ICD-10-CM | POA: Diagnosis present

## 2022-07-02 NOTE — Discharge Instructions (Signed)
Take Tylenol every 6 hours if needed for pain.  Laying ice pack over the cast off-and-on throughout the day.  Keep the arm elevated on a pillow when you are resting.  Call and schedule an appointment with orthopedics.

## 2022-07-02 NOTE — ED Provider Notes (Signed)
[facility] Provider Note    Event Date/Time   First MD Initiated Contact with Patient 07/02/22 [phone]     (approximate)   History   Fall   HPI  Vanessa Brock is a 89 y.o. female  with history of A-fib, GERD, hypertension and as listed in EMR presents to the emergency department for evaluation of right wrist pain after mechanical, nonsyncopal fall this morning.  She was trying to kill a bug and tripped.  She reached out with her right hand to catch herself.  She denies hitting her head.  She has no headache, neck pain, shoulder or elbow pain.  She denies back pain, hip pain, knee or ankle pain.Marland Kitchen      Physical Exam   Triage Vital Signs: ED Triage Vitals  Enc Vitals Group     BP 07/02/22 0917 (!) 143/67     Pulse Rate 07/02/22 0917 72     Resp 07/02/22 0917 18     Temp 07/02/22 0917 98 F (36.7 C)     Temp src --      SpO2 07/02/22 0917 97 %     Weight 07/02/22 0929 95 lb 0.3 oz (43.1 kg)     Height 07/02/22 0929 5\' 3"  (1.6 m)     Head Circumference --      Peak Flow --      Pain Score 07/02/22 0915 7     Pain Loc --      Pain Edu? --      Excl. in [place]? --     Most recent vital signs: Vitals:   07/02/22 0917  BP: (!) 143/67  Pulse: 72  Resp: 18  Temp: 98 F (36.7 C)  SpO2: 97%    General: Awake, no distress.  CV:  Good peripheral perfusion.  2+ radial pulse bilaterally. Resp:  Normal effort.  Abd:  No distention.  Other:  Ecchymosis of the right hand and wrist without obvious deformity.   ED Results / Procedures / Treatments   Labs (all labs ordered are listed, but only abnormal results are displayed) Labs Reviewed - No data to display   EKG  Not indicated   RADIOLOGY  Image and radiology report reviewed and interpreted by me. Radiology report consistent with the same.  Image of the right wrist shows impacted, transverse right metaphyseal fracture and displaced ulnar styloid fracture  PROCEDURES:  Critical Care  performed: No  .Ortho Injury Treatment  Date/Time: 07/02/2022 10:30 AM  Performed by: Chinita Pester, FNP Authorized by: Chinita Pester, FNP   Consent:    Consent obtained:  Verbal   Consent given by:  PatientInjury location: wrist Location details: right wrist Injury type: fracture Fracture type: distal radius and ulnar styloid Pre-procedure neurovascular assessment: neurovascularly intact Pre-procedure distal perfusion: normal Pre-procedure neurological function: normal Pre-procedure range of motion: reduced Manipulation performed: no Immobilization: splint Splint type: sugar tong Splint Applied by: ED Tech Supplies used: cotton padding, elastic bandage and Ortho-Glass Post-procedure neurovascular assessment: post-procedure neurovascularly intact Post-procedure distal perfusion: normal Post-procedure neurological function: normal Post-procedure range of motion: unchanged      MEDICATIONS ORDERED IN ED:  Medications - No data to display   IMPRESSION / MDM / ASSESSMENT AND PLAN / ED COURSE   I have reviewed the triage note.  Differential diagnosis includes, but is not limited to, wrist sprain, wrist fracture, hematoma  Patient's presentation is most consistent with acute complicated illness / injury requiring diagnostic workup.  Very active 89 year old female presenting to the emergency department after mechanical, nonsyncopal fall this morning.  See HPI for further details.  CT imaging of the head and cervical spine discussed however patient does not feel that she needs imaging.  She is awake, alert, and oriented and is not on blood thinners.  X-ray of the right wrist shows an impacted, transverse, right metaphyseal fracture and displaced distal ulnar styloid fracture.  Results discussed with the patient and family.  OCL applied as described above and sling applied.  Patient will take Tylenol every 6 hours if needed for pain.  She was also given follow-up  information with orthopedics.  She or family is to call to schedule a follow-up appointment.  ER precautions discussed as well.  Patient discharged home with family.      FINAL CLINICAL IMPRESSION(S) / ED DIAGNOSES   Final diagnoses:  Closed Colles' fracture of right radius, initial encounter  Closed displaced fracture of styloid process of right ulna, initial encounter     Rx / DC Orders   ED Discharge Orders     None        Note:  This document was prepared using Dragon voice recognition software and may include unintentional dictation errors.   Chinita Pester, FNP 07/02/22 1032    Chesley Noon, MD 07/03/22 [phone]

## 2022-07-02 NOTE — ED Triage Notes (Addendum)
Pt comes with c/o fall. Pt states wrist pain. Pt states she just tripped and fell hitting her wrist. Pt has swelling and bruising noted to wrist. Pt denies any loc or hitting head. Pt is not on thinners.

## 2023-11-14 ENCOUNTER — Other Ambulatory Visit: Payer: Self-pay

## 2023-11-14 ENCOUNTER — Emergency Department

## 2023-11-14 ENCOUNTER — Emergency Department
Admission: EM | Admit: 2023-11-14 | Discharge: 2023-11-14 | Disposition: A | Discharge status: Home / Self Care | Attending: Emergency Medicine | Admitting: Emergency Medicine

## 2023-11-14 DIAGNOSIS — M79605 Pain in left leg: Secondary | ICD-10-CM | POA: Diagnosis not present

## 2023-11-14 DIAGNOSIS — M79604 Pain in right leg: Secondary | ICD-10-CM | POA: Diagnosis present

## 2023-11-14 LAB — CBC WITH DIFFERENTIAL/PLATELET
Abs Immature Granulocytes: 0.02 K/uL (ref 0.00–0.07)
Basophils Absolute: 0.1 K/uL (ref 0.0–0.1)
Basophils Relative: 1 %
Eosinophils Absolute: 0.1 K/uL (ref 0.0–0.5)
Eosinophils Relative: 1 %
HCT: 41.2 % (ref 36.0–46.0)
Hemoglobin: 13.5 g/dL (ref 12.0–15.0)
Immature Granulocytes: 0 %
Lymphocytes Relative: 20 %
Lymphs Abs: 1.8 K/uL (ref 0.7–4.0)
MCH: 31 pg (ref 26.0–34.0)
MCHC: 32.8 g/dL (ref 30.0–36.0)
MCV: 94.7 fL (ref 80.0–100.0)
Monocytes Absolute: 0.6 K/uL (ref 0.1–1.0)
Monocytes Relative: 7 %
Neutro Abs: 6.4 K/uL (ref 1.7–7.7)
Neutrophils Relative %: 71 %
Platelets: 311 K/uL (ref 150–400)
RBC: 4.35 MIL/uL (ref 3.87–5.11)
RDW: 13.2 % (ref 11.5–15.5)
WBC: 9 K/uL (ref 4.0–10.5)
nRBC: 0 % (ref 0.0–0.2)

## 2023-11-14 LAB — RESP PANEL BY RT-PCR (RSV, FLU A&B, COVID)  RVPGX2
Influenza A by PCR: NEGATIVE
Influenza B by PCR: NEGATIVE
Resp Syncytial Virus by PCR: NEGATIVE
SARS Coronavirus 2 by RT PCR: NEGATIVE

## 2023-11-14 LAB — BASIC METABOLIC PANEL WITH GFR
Anion gap: 14 (ref 5–15)
BUN: 15 mg/dL (ref 8–23)
CO2: 24 mmol/L (ref 22–32)
Calcium: 9.3 mg/dL (ref 8.9–10.3)
Chloride: 99 mmol/L (ref 98–111)
Creatinine, Ser: 1.02 mg/dL — ABNORMAL HIGH (ref 0.44–1.00)
GFR, Estimated: 49 mL/min — ABNORMAL LOW (ref >60)
Glucose, Bld: 143 mg/dL — ABNORMAL HIGH (ref 70–99)
Potassium: 4.1 mmol/L (ref 3.5–5.1)
Sodium: 137 mmol/L (ref 135–145)

## 2023-11-14 LAB — CK: Total CK: 26 U/L — ABNORMAL LOW (ref 38–234)

## 2023-11-14 MED ORDER — SODIUM CHLORIDE 0.9 % IV BOLUS
500.0000 mL | Freq: Once | INTRAVENOUS | Status: AC
  Administered 2023-11-14: 500 mL via INTRAVENOUS

## 2023-11-14 NOTE — ED Provider Notes (Signed)
 [facility] Provider Note    Event Date/Time   First MD Initiated Contact with Patient 11/14/23 [phone]     (approximate)   History   Leg Pain   HPI  CALIANNA Brock is a 89 y.o. female presents to the emergency department today because concerns for leg pain.  Pain is located in bilateral legs.  She states that she has had some discomfort but last night the pain became severe.  The pain is located primarily in the inner bilateral thighs.  She also describes some sensation of cold in her legs.  She denies any unusual activity or trauma.  Denies pain like this before.  Has been dealing with cold-like symptoms over the past week.     Physical Exam   Triage Vital Signs: ED Triage Vitals [11/14/23 0831]  Encounter Vitals Group     BP (!) 153/83     Girls Systolic BP Percentile      Girls Diastolic BP Percentile      Boys Systolic BP Percentile      Boys Diastolic BP Percentile      Pulse Rate 71     Resp 18     Temp 98 F (36.7 C)     Temp Source Oral     SpO2 98 %     Weight      Height      Head Circumference      Peak Flow      Pain Score 8     Pain Loc      Pain Education      Exclude from Growth Chart     Most recent vital signs: Vitals:   11/14/23 0831 11/14/23 0930  BP: (!) 153/83 (!) 165/75  Pulse: 71 64  Resp: 18 18  Temp: 98 F (36.7 C)   SpO2: 98% 96%   General: Awake, alert, oriented.  CV:  Good peripheral perfusion. Regular rate and rhythm Resp:  Normal effort. Lungs clear. Abd:  No distention. Non tender. Other:  Bilateral legs warm. DP 2+ bilaterally. No rash or skin change. Some tenderness to palpation of calves and thighs.    ED Results / Procedures / Treatments   Labs (all labs ordered are listed, but only abnormal results are displayed) Labs Reviewed  CK - Abnormal; Notable for the following components:      Result Value   Total CK 26 (*)    All other components within normal limits  BASIC METABOLIC PANEL WITH GFR  - Abnormal; Notable for the following components:   Glucose, Bld 143 (*)    Creatinine, Ser 1.02 (*)    GFR, Estimated 49 (*)    All other components within normal limits  RESP PANEL BY RT-PCR (RSV, FLU A&B, COVID)  RVPGX2  CBC WITH DIFFERENTIAL/PLATELET     EKG  None   RADIOLOGY I independently interpreted and visualized the US  DVT. My interpretation: No DVT Radiology interpretation:  IMPRESSION:  No lower extremity DVT.     PROCEDURES:  Critical Care performed: No   MEDICATIONS ORDERED IN ED: Medications - No data to display   IMPRESSION / MDM / ASSESSMENT AND PLAN / ED COURSE  I reviewed the triage vital signs and the nursing notes.                              Differential diagnosis includes, but is not limited to, DVT, arterial occlusion,  infection, myositis, electrolyte abnormality, dehydration  Patient's presentation is most consistent with acute presentation with potential threat to life or bodily function.   Patient presented to the emergency department today because concerns for bilateral leg pain.  Physical exam is relatively benign.  She does have some tenderness to her legs but no skin changes or findings concerning for arterial or venous clot.  Did however obtain ultrasound to make sure there was no DVT.  This was reassuring.  Also check blood work did not show any concerning electrolyte abnormality.  Patient did feel some improvement after IV fluids.  I do wonder if dehydration is playing a role.  Patient has had cold-like symptoms so could be on the dehydrated side.  Will plan on discharging.  Encouraged patient to continue hydration and follow up with PCP.     FINAL CLINICAL IMPRESSION(S) / ED DIAGNOSES   Final diagnoses:  Bilateral leg pain     Note:  This document was prepared using Dragon voice recognition software and may include unintentional dictation errors.    Floy Roberts, MD 11/14/23 1440

## 2023-11-14 NOTE — ED Notes (Signed)
 Pt verbalizes understanding of discharge instructions. Opportunity for questioning and answers were provided. Pt discharged from ED to home with family.

## 2023-11-14 NOTE — ED Notes (Signed)
 Pt reports bilateral leg pain. No injury or trauma. No swelling

## 2023-11-14 NOTE — ED Triage Notes (Addendum)
 Pt to ED via POV from [facility] with daughter. Pt reports both legs have been aching for the last few and this morning she woke up and both legs were hurting worse. Pt reports cold for the last few weeks and has been given tylenol  for pain and cold symptoms. Denies falls or injury. Pt walks with a walker. No swelling noted. Pt had the flu shots a couple of weeks ago.

## 2023-11-14 NOTE — Discharge Instructions (Addendum)
 As we discussed please be sure to continue hydration. Please seek medical attention for any worsening pain, skin changes, decreased sensation in your legs or any other new or concerning symptoms.
